# Patient Record
Sex: Female | Born: 1945 | Race: White | Hispanic: No | Smoking: Never smoker
Health system: Southern US, Community
[De-identification: ages and names within clinical notes are randomized; demographics above are authoritative.]

## PROBLEM LIST (undated history)

## (undated) DIAGNOSIS — R600 Localized edema: Secondary | ICD-10-CM

## (undated) DIAGNOSIS — R42 Dizziness and giddiness: Secondary | ICD-10-CM

## (undated) DIAGNOSIS — N83201 Unspecified ovarian cyst, right side: Secondary | ICD-10-CM

## (undated) DIAGNOSIS — I2510 Atherosclerotic heart disease of native coronary artery without angina pectoris: Secondary | ICD-10-CM

## (undated) DIAGNOSIS — J309 Allergic rhinitis, unspecified: Secondary | ICD-10-CM

## (undated) DIAGNOSIS — I4891 Unspecified atrial fibrillation: Secondary | ICD-10-CM

## (undated) DIAGNOSIS — M722 Plantar fascial fibromatosis: Secondary | ICD-10-CM

## (undated) DIAGNOSIS — I10 Essential (primary) hypertension: Secondary | ICD-10-CM

## (undated) DIAGNOSIS — F331 Major depressive disorder, recurrent, moderate: Secondary | ICD-10-CM

## (undated) DIAGNOSIS — F419 Anxiety disorder, unspecified: Secondary | ICD-10-CM

## (undated) DIAGNOSIS — K449 Diaphragmatic hernia without obstruction or gangrene: Secondary | ICD-10-CM

## (undated) DIAGNOSIS — D509 Iron deficiency anemia, unspecified: Secondary | ICD-10-CM

## (undated) DIAGNOSIS — E7800 Pure hypercholesterolemia, unspecified: Secondary | ICD-10-CM

## (undated) DIAGNOSIS — Z8781 Personal history of (healed) traumatic fracture: Secondary | ICD-10-CM

## (undated) DIAGNOSIS — N281 Cyst of kidney, acquired: Secondary | ICD-10-CM

## (undated) DIAGNOSIS — R5381 Other malaise: Secondary | ICD-10-CM

## (undated) DIAGNOSIS — R569 Unspecified convulsions: Secondary | ICD-10-CM

## (undated) DIAGNOSIS — E049 Nontoxic goiter, unspecified: Secondary | ICD-10-CM

## (undated) DIAGNOSIS — Z79899 Other long term (current) drug therapy: Secondary | ICD-10-CM

## (undated) DIAGNOSIS — F5101 Primary insomnia: Secondary | ICD-10-CM

## (undated) DIAGNOSIS — K219 Gastro-esophageal reflux disease without esophagitis: Secondary | ICD-10-CM

## (undated) DIAGNOSIS — R5383 Other fatigue: Secondary | ICD-10-CM

## (undated) DIAGNOSIS — M5134 Other intervertebral disc degeneration, thoracic region: Secondary | ICD-10-CM

## (undated) DIAGNOSIS — E785 Hyperlipidemia, unspecified: Secondary | ICD-10-CM

## (undated) DIAGNOSIS — R001 Bradycardia, unspecified: Secondary | ICD-10-CM

## (undated) DIAGNOSIS — M810 Age-related osteoporosis without current pathological fracture: Secondary | ICD-10-CM

## (undated) DIAGNOSIS — M5137 Other intervertebral disc degeneration, lumbosacral region: Secondary | ICD-10-CM

## (undated) DIAGNOSIS — R609 Edema, unspecified: Secondary | ICD-10-CM

## (undated) DIAGNOSIS — J984 Other disorders of lung: Secondary | ICD-10-CM

## (undated) DIAGNOSIS — E782 Mixed hyperlipidemia: Secondary | ICD-10-CM

## (undated) DIAGNOSIS — Z7409 Other reduced mobility: Secondary | ICD-10-CM

## (undated) DIAGNOSIS — I4892 Unspecified atrial flutter: Secondary | ICD-10-CM

## (undated) DIAGNOSIS — C50911 Malignant neoplasm of unspecified site of right female breast: Secondary | ICD-10-CM

## (undated) DIAGNOSIS — E119 Type 2 diabetes mellitus without complications: Secondary | ICD-10-CM

## (undated) DIAGNOSIS — M159 Polyosteoarthritis, unspecified: Secondary | ICD-10-CM

## (undated) HISTORY — DX: Plantar fascial fibromatosis: M72.2

## (undated) HISTORY — DX: Atherosclerotic heart disease of native coronary artery without angina pectoris: I25.10

## (undated) HISTORY — DX: Type 2 diabetes mellitus without complications: E11.9

## (undated) HISTORY — DX: Unspecified atrial fibrillation: I48.91

## (undated) HISTORY — DX: Cyst of kidney, acquired: N28.1

## (undated) HISTORY — DX: Allergic rhinitis, unspecified: J30.9

## (undated) HISTORY — DX: Other intervertebral disc degeneration, lumbosacral region: M51.37

## (undated) HISTORY — DX: Dizziness and giddiness: R42

## (undated) HISTORY — DX: Polyosteoarthritis, unspecified: M15.9

## (undated) HISTORY — DX: Edema, unspecified: R60.9

## (undated) HISTORY — PX: FOOT SURGERY: SHX648

## (undated) HISTORY — DX: Other reduced mobility: Z74.09

## (undated) HISTORY — DX: Anxiety disorder, unspecified: F41.9

## (undated) HISTORY — DX: Other long term (current) drug therapy: Z79.899

## (undated) HISTORY — DX: Diaphragmatic hernia without obstruction or gangrene: K44.9

## (undated) HISTORY — DX: Unspecified convulsions: R56.9

## (undated) HISTORY — DX: Other disorders of lung: J98.4

## (undated) HISTORY — DX: Localized edema: R60.0

## (undated) HISTORY — DX: Nontoxic goiter, unspecified: E04.9

## (undated) HISTORY — DX: Malignant neoplasm of unspecified site of right female breast: C50.911

## (undated) HISTORY — DX: Hyperlipidemia, unspecified: E78.5

## (undated) HISTORY — PX: TUBAL LIGATION: SHX77

## (undated) HISTORY — DX: Mixed hyperlipidemia: E78.2

## (undated) HISTORY — DX: Other malaise: R53.81

## (undated) HISTORY — DX: Other intervertebral disc degeneration, thoracic region: M51.34

## (undated) HISTORY — DX: Gastro-esophageal reflux disease without esophagitis: K21.9

## (undated) HISTORY — DX: Pure hypercholesterolemia, unspecified: E78.00

## (undated) HISTORY — DX: Iron deficiency anemia, unspecified: D50.9

## (undated) HISTORY — DX: Major depressive disorder, recurrent, moderate: F33.1

## (undated) HISTORY — DX: Essential (primary) hypertension: I10

## (undated) HISTORY — DX: Primary insomnia: F51.01

## (undated) HISTORY — DX: Unspecified atrial flutter: I48.92

## (undated) HISTORY — PX: CHOLECYSTECTOMY: SHX55

## (undated) HISTORY — PX: SHOULDER SURGERY: SHX246

## (undated) HISTORY — PX: BREAST SURGERY: SHX581

## (undated) HISTORY — DX: Personal history of (healed) traumatic fracture: Z87.81

## (undated) HISTORY — DX: Other fatigue: R53.83

## (undated) HISTORY — DX: Age-related osteoporosis without current pathological fracture: M81.0

## (undated) HISTORY — DX: Unspecified ovarian cyst, right side: N83.201

## (undated) HISTORY — DX: Bradycardia, unspecified: R00.1

## (undated) HISTORY — PX: ANKLE SURGERY: SHX546

## (undated) HISTORY — PX: CATARACT EXTRACTION: SUR2

## (undated) MED FILL — Ferumoxytol Inj 510 MG/17ML (30 MG/ML) (Elemental Fe): INTRAVENOUS | Qty: 17 | Status: AC

---

## 2006-04-27 ENCOUNTER — Inpatient Hospital Stay (HOSPITAL_COMMUNITY): Admission: RE | Admit: 2006-04-27 | Discharge: 2006-04-29 | Payer: Self-pay | Admitting: Orthopedic Surgery

## 2012-09-12 ENCOUNTER — Encounter (INDEPENDENT_AMBULATORY_CARE_PROVIDER_SITE_OTHER): Payer: Self-pay

## 2012-09-12 ENCOUNTER — Encounter (INDEPENDENT_AMBULATORY_CARE_PROVIDER_SITE_OTHER): Payer: Self-pay | Admitting: General Surgery

## 2012-09-23 ENCOUNTER — Encounter (INDEPENDENT_AMBULATORY_CARE_PROVIDER_SITE_OTHER): Payer: BLUE CROSS/BLUE SHIELD | Admitting: Surgery

## 2014-03-14 ENCOUNTER — Ambulatory Visit (INDEPENDENT_AMBULATORY_CARE_PROVIDER_SITE_OTHER): Payer: Medicare Other

## 2014-03-14 DIAGNOSIS — E1149 Type 2 diabetes mellitus with other diabetic neurological complication: Secondary | ICD-10-CM

## 2014-03-14 DIAGNOSIS — B070 Plantar wart: Secondary | ICD-10-CM

## 2014-03-14 DIAGNOSIS — Q828 Other specified congenital malformations of skin: Secondary | ICD-10-CM

## 2014-03-14 DIAGNOSIS — E1140 Type 2 diabetes mellitus with diabetic neuropathy, unspecified: Secondary | ICD-10-CM

## 2014-03-14 DIAGNOSIS — E1142 Type 2 diabetes mellitus with diabetic polyneuropathy: Secondary | ICD-10-CM

## 2014-03-14 NOTE — Progress Notes (Signed)
   Subjective:    Patient ID: Briana Barrett, female    DOB: September 25, 1946, 68 y.o.   MRN: [id number]  HPI I have a place on the ball of my left foot and has been going on for about a year and my husband cuts on it and sore and tender and I have used that corn and callus stuff otc     Review of Systems  Musculoskeletal: Positive for gait problem.  All other systems reviewed and are negative.       Objective:   Physical Exam Functioning objective findings as follows vascular status is intact with pedal pulses palpable DP and PT +2/4 bilateral. Capillary refill timed 3-4 seconds all digits skin temperature warm turgor normal no edema rubor pallor or varicosities noted. Neurologically epicritic and proprioceptive sensations appear to be intact and symmetric bilateral there is normal plantar response DTRs not listed dermatologically skin color pigment normal hair growth absent nails somewhat criptotic incurvated patient been doing self-care for the nails her husband to care for nails and skin issues and debridements neurologically there is an a keratoses plantar lateral fifth MTP area left and sub-heel right there is also diffuse keratoses pinch callus and first right and sub-second MTP area right and sub-first MTP area left is are more consistent with porokeratosis for hypertrophic keratoses secondary pressure and friction areas over the to a small nuclear is or possibly verrucoid in nature patient been using topical salicylic acid and wart medication her core medications with little or no success or lesions are relatively small superficial at this time both lesions are addressed and debrided pack to 66% salicylic acid for 24 hours under occlusion. The remaining keratotic lesions are also debridement multiple 4 keratoses and patient is advised that she she needs to avoid the salicylic acid and home no home remedies or other soft tissue and the future she has problems difficulties suggested followup for  palliative care is needed may use the knee the salicylic acid place for [id number]. At this time.       Assessment & Plan:  Assessment diabetes with mild peripheral neuropathy does have some paresthesia tinnitus and cramping in her legs although not significant no refusal symptoms pedal pulses palpable at this time does have multiple 4 keratoses multiple verruca which are debrided the verruca packed with salicylic acid for 24 hours and a keratoses debridement return at future for continued palliative care as needed for followup  Harriet Masson DPM

## 2014-03-14 NOTE — Patient Instructions (Addendum)
Diabetes and Foot Care Diabetes may cause you to have problems because of poor blood supply (circulation) to your feet and legs. This may cause the skin on your feet to become thinner, break easier, and heal more slowly. Your skin may become dry, and the skin may peel and crack. You may also have nerve damage in your legs and feet causing decreased feeling in them. You may not notice minor injuries to your feet that could lead to infections or more serious problems. Taking care of your feet is one of the most important things you can do for yourself.  HOME CARE INSTRUCTIONS  Wear shoes at all times, even in the house. Do not go barefoot. Bare feet are easily injured.  Check your feet daily for blisters, cuts, and redness. If you cannot see the bottom of your feet, use a mirror or ask someone for help.  Wash your feet with warm water (do not use hot water) and mild soap. Then pat your feet and the areas between your toes until they are completely dry. Do not soak your feet as this can dry your skin.  Apply a moisturizing lotion or petroleum jelly (that does not contain alcohol and is unscented) to the skin on your feet and to dry, brittle toenails. Do not apply lotion between your toes.  Trim your toenails straight across. Do not dig under them or around the cuticle. File the edges of your nails with an emery board or nail file.  Do not cut corns or calluses or try to remove them with medicine.  Wear clean socks or stockings every day. Make sure they are not too tight. Do not wear knee-high stockings since they may decrease blood flow to your legs.  Wear shoes that fit properly and have enough cushioning. To break in new shoes, wear them for just a few hours a day. This prevents you from injuring your feet. Always look in your shoes before you put them on to be sure there are no objects inside.  Do not cross your legs. This may decrease the blood flow to your feet.  If you find a minor scrape,  cut, or break in the skin on your feet, keep it and the skin around it clean and dry. These areas may be cleansed with mild soap and water. Do not cleanse the area with peroxide, alcohol, or iodine.  When you remove an adhesive bandage, be sure not to damage the skin around it.  If you have a wound, look at it several times a day to make sure it is healing.  Do not use heating pads or hot water bottles. They may burn your skin. If you have lost feeling in your feet or legs, you may not know it is happening until it is too late.  Make sure your health care provider performs a complete foot exam at least annually or more often if you have foot problems. Report any cuts, sores, or bruises to your health care provider immediately. SEEK MEDICAL CARE IF:   You have an injury that is not healing.  You have cuts or breaks in the skin.  You have an ingrown nail.  You notice redness on your legs or feet.  You feel burning or tingling in your legs or feet.  You have pain or cramps in your legs and feet.  Your legs or feet are numb.  Your feet always feel cold. SEEK IMMEDIATE MEDICAL CARE IF:   There is increasing redness,   swelling, or pain in or around a wound.  There is a red line that goes up your leg.  Pus is coming from a wound.  You develop a fever or as directed by your health care provider.  You notice a bad smell coming from an ulcer or wound. Document Released: 12/04/2000 Document Revised: 08/09/2013 Document Reviewed: 05/16/2013 [facility] Patient Information 2014 ExitCare, [place])  Warts are caused by a virus that has invaded the skin.  They are more common in young adults and children and a small percentage will resolve on their own.  There are many types of warts including mosaic warts (large flat), vulgaris (domed warts-have pearl like appearance), and plantar warts (flat or cauliflower like appearance).  Warts are highly contagious and may be picked  up from any surface.  Warts thrive in a warm moist environment and are common near pools, showers, and locker room floors.  Any microscopic cut in the skin is where the virus enters and becomes a wart.  Warts are very difficult to treat and get rid of.  Patience is necessary in the treatment of this virus.  It may take months to cure and different methods may have to be used to get rid of your wart.  Standard Initial Treatment is: 1. Periodic debridement of the wart and application of Canthacur to each lesion (a blistering agent that will slough off the warty skin) 2. Dispensing of topical treatments/prescriptions to apply to the wart at home  Other options include: 1. Excision of the lesion-numbing the skin around the wart and cutting it out-requires daily soaks post-operatively and takes about 2-3 weeks to fully heal 2. Excision with CO2 Laser-Performed at the surgical center your foot is numbed up and the lesions are all cut out and then lasered with a high power laser.  Very good for multiple warts that are resistant. 3. Cimetidine (Tagamet)-Oral agent used in high does--has shown better results in children  How do I apply the standard topical treatments?  1. Salicylic Acid (Compound W wart remover liquid or gel-available at drug or grocery stores)-Apply a dime size thickness over the wart and cover with duct tape-apply at night so the medication does not spread out to the good skin.  The skin will turn white and slowly blister off.  Use a pumice stone daily to remove the white skin as best you can.  If the skin gets too raw and painful, discontinue for a few days then resume. 2. Aldara (Imiquimod)-this is an immune response modifier.  They come in little packets so try to get at least 2 days out of each packet if you can.  Apply a small amount to the lesion and cover with duct tape.  Do not rub it in-let it absorb on its own.  Good to apply each morning.  Other Helpful Hints:  Wash shoes  that can be washed in the washing machine 2-3 x per month with some bleach  Use Lysol in shoes that cannot be washed and wipe out with a cloth 1 x per week-allow to dry for 8 hours before wearing again  Use a bleach solution (1 part bleach to 3 parts water) in your tub or shower to reduce the spread of the virus to yourself and others  Use aqua socks or clean sandals when at the pool or locker room to reduce the chance of picking up the virus or spreading it to others

## 2014-03-23 ENCOUNTER — Encounter (INDEPENDENT_AMBULATORY_CARE_PROVIDER_SITE_OTHER): Payer: Self-pay | Admitting: General Surgery

## 2015-08-27 DIAGNOSIS — I10 Essential (primary) hypertension: Secondary | ICD-10-CM

## 2015-08-27 HISTORY — DX: Essential (primary) hypertension: I10

## 2015-09-13 DIAGNOSIS — R569 Unspecified convulsions: Secondary | ICD-10-CM

## 2015-09-13 HISTORY — DX: Unspecified convulsions: R56.9

## 2015-10-10 DIAGNOSIS — M810 Age-related osteoporosis without current pathological fracture: Secondary | ICD-10-CM

## 2015-10-10 DIAGNOSIS — C50919 Malignant neoplasm of unspecified site of unspecified female breast: Secondary | ICD-10-CM | POA: Diagnosis not present

## 2015-10-10 DIAGNOSIS — D509 Iron deficiency anemia, unspecified: Secondary | ICD-10-CM

## 2015-12-20 DIAGNOSIS — Z170 Estrogen receptor positive status [ER+]: Secondary | ICD-10-CM | POA: Diagnosis not present

## 2015-12-20 DIAGNOSIS — D509 Iron deficiency anemia, unspecified: Secondary | ICD-10-CM

## 2015-12-20 DIAGNOSIS — C50511 Malignant neoplasm of lower-outer quadrant of right female breast: Secondary | ICD-10-CM | POA: Diagnosis not present

## 2016-01-21 DIAGNOSIS — J309 Allergic rhinitis, unspecified: Secondary | ICD-10-CM

## 2016-01-21 DIAGNOSIS — F331 Major depressive disorder, recurrent, moderate: Secondary | ICD-10-CM

## 2016-01-21 DIAGNOSIS — C50911 Malignant neoplasm of unspecified site of right female breast: Secondary | ICD-10-CM

## 2016-01-21 DIAGNOSIS — Z79899 Other long term (current) drug therapy: Secondary | ICD-10-CM

## 2016-01-21 DIAGNOSIS — M159 Polyosteoarthritis, unspecified: Secondary | ICD-10-CM

## 2016-01-21 DIAGNOSIS — R5381 Other malaise: Secondary | ICD-10-CM

## 2016-01-21 DIAGNOSIS — E119 Type 2 diabetes mellitus without complications: Secondary | ICD-10-CM | POA: Insufficient documentation

## 2016-01-21 DIAGNOSIS — M5137 Other intervertebral disc degeneration, lumbosacral region: Secondary | ICD-10-CM

## 2016-01-21 DIAGNOSIS — E782 Mixed hyperlipidemia: Secondary | ICD-10-CM

## 2016-01-21 DIAGNOSIS — I2510 Atherosclerotic heart disease of native coronary artery without angina pectoris: Secondary | ICD-10-CM

## 2016-01-21 DIAGNOSIS — R600 Localized edema: Secondary | ICD-10-CM

## 2016-01-21 DIAGNOSIS — M810 Age-related osteoporosis without current pathological fracture: Secondary | ICD-10-CM

## 2016-01-21 DIAGNOSIS — D509 Iron deficiency anemia, unspecified: Secondary | ICD-10-CM

## 2016-01-21 DIAGNOSIS — M51379 Other intervertebral disc degeneration, lumbosacral region without mention of lumbar back pain or lower extremity pain: Secondary | ICD-10-CM

## 2016-01-21 DIAGNOSIS — K219 Gastro-esophageal reflux disease without esophagitis: Secondary | ICD-10-CM

## 2016-01-21 DIAGNOSIS — N83201 Unspecified ovarian cyst, right side: Secondary | ICD-10-CM

## 2016-01-21 DIAGNOSIS — K449 Diaphragmatic hernia without obstruction or gangrene: Secondary | ICD-10-CM

## 2016-01-21 DIAGNOSIS — Z8781 Personal history of (healed) traumatic fracture: Secondary | ICD-10-CM

## 2016-01-21 DIAGNOSIS — E049 Nontoxic goiter, unspecified: Secondary | ICD-10-CM

## 2016-01-21 DIAGNOSIS — R7303 Prediabetes: Secondary | ICD-10-CM

## 2016-01-21 DIAGNOSIS — J984 Other disorders of lung: Secondary | ICD-10-CM

## 2016-01-21 DIAGNOSIS — R5383 Other fatigue: Secondary | ICD-10-CM

## 2016-01-21 DIAGNOSIS — N281 Cyst of kidney, acquired: Secondary | ICD-10-CM

## 2016-01-21 DIAGNOSIS — Z853 Personal history of malignant neoplasm of breast: Secondary | ICD-10-CM

## 2016-01-21 HISTORY — DX: Type 2 diabetes mellitus without complications: E11.9

## 2016-01-21 HISTORY — DX: Other malaise: R53.81

## 2016-01-21 HISTORY — DX: Personal history of malignant neoplasm of breast: Z85.3

## 2016-01-21 HISTORY — DX: Allergic rhinitis, unspecified: J30.9

## 2016-01-21 HISTORY — DX: Major depressive disorder, recurrent, moderate: F33.1

## 2016-01-21 HISTORY — DX: Other intervertebral disc degeneration, lumbosacral region: M51.37

## 2016-01-21 HISTORY — DX: Diaphragmatic hernia without obstruction or gangrene: K44.9

## 2016-01-21 HISTORY — DX: Cyst of kidney, acquired: N28.1

## 2016-01-21 HISTORY — DX: Atherosclerotic heart disease of native coronary artery without angina pectoris: I25.10

## 2016-01-21 HISTORY — DX: Gastro-esophageal reflux disease without esophagitis: K21.9

## 2016-01-21 HISTORY — DX: Iron deficiency anemia, unspecified: D50.9

## 2016-01-21 HISTORY — DX: Personal history of (healed) traumatic fracture: Z87.81

## 2016-01-21 HISTORY — DX: Other long term (current) drug therapy: Z79.899

## 2016-01-21 HISTORY — DX: Age-related osteoporosis without current pathological fracture: M81.0

## 2016-01-21 HISTORY — DX: Malignant neoplasm of unspecified site of right female breast: C50.911

## 2016-01-21 HISTORY — DX: Prediabetes: R73.03

## 2016-01-21 HISTORY — DX: Polyosteoarthritis, unspecified: M15.9

## 2016-01-21 HISTORY — DX: Other intervertebral disc degeneration, lumbosacral region without mention of lumbar back pain or lower extremity pain: M51.379

## 2016-01-21 HISTORY — DX: Localized edema: R60.0

## 2016-01-21 HISTORY — DX: Mixed hyperlipidemia: E78.2

## 2016-01-21 HISTORY — DX: Other disorders of lung: J98.4

## 2016-01-21 HISTORY — DX: Nontoxic goiter, unspecified: E04.9

## 2016-01-21 HISTORY — DX: Unspecified ovarian cyst, right side: N83.201

## 2016-04-13 DIAGNOSIS — F5101 Primary insomnia: Secondary | ICD-10-CM

## 2016-04-13 DIAGNOSIS — F419 Anxiety disorder, unspecified: Secondary | ICD-10-CM

## 2016-04-13 HISTORY — DX: Primary insomnia: F51.01

## 2016-04-13 HISTORY — DX: Anxiety disorder, unspecified: F41.9

## 2016-06-30 DIAGNOSIS — Z170 Estrogen receptor positive status [ER+]: Secondary | ICD-10-CM | POA: Diagnosis not present

## 2016-06-30 DIAGNOSIS — D509 Iron deficiency anemia, unspecified: Secondary | ICD-10-CM

## 2016-06-30 DIAGNOSIS — C50919 Malignant neoplasm of unspecified site of unspecified female breast: Secondary | ICD-10-CM | POA: Diagnosis not present

## 2016-07-23 DIAGNOSIS — I4892 Unspecified atrial flutter: Secondary | ICD-10-CM

## 2016-07-23 DIAGNOSIS — E785 Hyperlipidemia, unspecified: Secondary | ICD-10-CM

## 2016-07-23 HISTORY — DX: Hyperlipidemia, unspecified: E78.5

## 2016-07-23 HISTORY — DX: Unspecified atrial flutter: I48.92

## 2016-08-18 DIAGNOSIS — C50511 Malignant neoplasm of lower-outer quadrant of right female breast: Secondary | ICD-10-CM | POA: Diagnosis not present

## 2016-10-28 DIAGNOSIS — Z170 Estrogen receptor positive status [ER+]: Secondary | ICD-10-CM | POA: Diagnosis not present

## 2016-10-28 DIAGNOSIS — D509 Iron deficiency anemia, unspecified: Secondary | ICD-10-CM | POA: Diagnosis not present

## 2016-10-28 DIAGNOSIS — C50511 Malignant neoplasm of lower-outer quadrant of right female breast: Secondary | ICD-10-CM | POA: Diagnosis not present

## 2017-04-27 DIAGNOSIS — D509 Iron deficiency anemia, unspecified: Secondary | ICD-10-CM | POA: Diagnosis not present

## 2017-04-27 DIAGNOSIS — C50919 Malignant neoplasm of unspecified site of unspecified female breast: Secondary | ICD-10-CM | POA: Diagnosis not present

## 2017-04-27 DIAGNOSIS — Z170 Estrogen receptor positive status [ER+]: Secondary | ICD-10-CM | POA: Diagnosis not present

## 2017-07-02 DIAGNOSIS — R42 Dizziness and giddiness: Secondary | ICD-10-CM

## 2017-07-02 DIAGNOSIS — R609 Edema, unspecified: Secondary | ICD-10-CM | POA: Insufficient documentation

## 2017-07-02 DIAGNOSIS — R001 Bradycardia, unspecified: Secondary | ICD-10-CM

## 2017-07-02 HISTORY — DX: Dizziness and giddiness: R42

## 2017-07-02 HISTORY — DX: Edema, unspecified: R60.9

## 2017-07-02 HISTORY — DX: Bradycardia, unspecified: R00.1

## 2017-08-10 DIAGNOSIS — D509 Iron deficiency anemia, unspecified: Secondary | ICD-10-CM

## 2017-08-10 DIAGNOSIS — Z170 Estrogen receptor positive status [ER+]: Secondary | ICD-10-CM

## 2017-08-10 DIAGNOSIS — Z79811 Long term (current) use of aromatase inhibitors: Secondary | ICD-10-CM

## 2017-08-10 DIAGNOSIS — C50919 Malignant neoplasm of unspecified site of unspecified female breast: Secondary | ICD-10-CM

## 2018-01-27 ENCOUNTER — Other Ambulatory Visit: Payer: Self-pay

## 2018-02-10 DIAGNOSIS — Z170 Estrogen receptor positive status [ER+]: Secondary | ICD-10-CM

## 2018-02-10 DIAGNOSIS — C50911 Malignant neoplasm of unspecified site of right female breast: Secondary | ICD-10-CM

## 2018-02-10 DIAGNOSIS — D509 Iron deficiency anemia, unspecified: Secondary | ICD-10-CM

## 2018-06-10 DIAGNOSIS — C50911 Malignant neoplasm of unspecified site of right female breast: Secondary | ICD-10-CM

## 2018-06-10 DIAGNOSIS — Z170 Estrogen receptor positive status [ER+]: Secondary | ICD-10-CM

## 2018-06-10 DIAGNOSIS — Z862 Personal history of diseases of the blood and blood-forming organs and certain disorders involving the immune mechanism: Secondary | ICD-10-CM | POA: Diagnosis not present

## 2018-06-16 DIAGNOSIS — M5134 Other intervertebral disc degeneration, thoracic region: Secondary | ICD-10-CM

## 2018-06-16 HISTORY — DX: Other intervertebral disc degeneration, thoracic region: M51.34

## 2018-09-01 DIAGNOSIS — R739 Hyperglycemia, unspecified: Secondary | ICD-10-CM

## 2018-09-01 DIAGNOSIS — R0789 Other chest pain: Secondary | ICD-10-CM | POA: Diagnosis not present

## 2018-09-01 DIAGNOSIS — I160 Hypertensive urgency: Secondary | ICD-10-CM

## 2018-09-01 DIAGNOSIS — D509 Iron deficiency anemia, unspecified: Secondary | ICD-10-CM

## 2018-09-01 DIAGNOSIS — I249 Acute ischemic heart disease, unspecified: Secondary | ICD-10-CM

## 2018-09-02 DIAGNOSIS — R0789 Other chest pain: Secondary | ICD-10-CM | POA: Diagnosis not present

## 2018-09-02 DIAGNOSIS — E119 Type 2 diabetes mellitus without complications: Secondary | ICD-10-CM

## 2018-09-02 DIAGNOSIS — R739 Hyperglycemia, unspecified: Secondary | ICD-10-CM | POA: Diagnosis not present

## 2018-09-02 DIAGNOSIS — R079 Chest pain, unspecified: Secondary | ICD-10-CM

## 2018-09-02 DIAGNOSIS — I160 Hypertensive urgency: Secondary | ICD-10-CM | POA: Diagnosis not present

## 2018-09-02 DIAGNOSIS — D509 Iron deficiency anemia, unspecified: Secondary | ICD-10-CM | POA: Diagnosis not present

## 2018-09-03 DIAGNOSIS — D509 Iron deficiency anemia, unspecified: Secondary | ICD-10-CM | POA: Diagnosis not present

## 2018-09-03 DIAGNOSIS — R739 Hyperglycemia, unspecified: Secondary | ICD-10-CM | POA: Diagnosis not present

## 2018-09-03 DIAGNOSIS — R079 Chest pain, unspecified: Secondary | ICD-10-CM

## 2018-09-03 DIAGNOSIS — R0789 Other chest pain: Secondary | ICD-10-CM | POA: Diagnosis not present

## 2018-10-19 ENCOUNTER — Encounter: Payer: Self-pay | Admitting: *Deleted

## 2018-10-19 ENCOUNTER — Other Ambulatory Visit: Payer: Self-pay | Admitting: *Deleted

## 2018-10-19 DIAGNOSIS — Z7409 Other reduced mobility: Secondary | ICD-10-CM

## 2018-10-19 HISTORY — DX: Other reduced mobility: Z74.09

## 2018-10-21 ENCOUNTER — Encounter: Payer: Self-pay | Admitting: Cardiology

## 2018-10-21 ENCOUNTER — Ambulatory Visit (INDEPENDENT_AMBULATORY_CARE_PROVIDER_SITE_OTHER): Payer: Medicare Other | Admitting: Cardiology

## 2018-10-21 ENCOUNTER — Other Ambulatory Visit: Payer: Medicare Other

## 2018-10-21 VITALS — BP 150/90 | HR 59 | Ht 66.0 in | Wt 180.4 lb

## 2018-10-21 DIAGNOSIS — F5101 Primary insomnia: Secondary | ICD-10-CM

## 2018-10-21 DIAGNOSIS — I10 Essential (primary) hypertension: Secondary | ICD-10-CM

## 2018-10-21 DIAGNOSIS — R001 Bradycardia, unspecified: Secondary | ICD-10-CM

## 2018-10-21 DIAGNOSIS — R251 Tremor, unspecified: Secondary | ICD-10-CM

## 2018-10-21 DIAGNOSIS — I4892 Unspecified atrial flutter: Secondary | ICD-10-CM

## 2018-10-21 HISTORY — DX: Tremor, unspecified: R25.1

## 2018-10-21 NOTE — Progress Notes (Signed)
Cardiology Office Note:    Date:  10/21/2018   ID:  Briana Barrett, DOB 07/20/1946, MRN [id number]  PCP:  [name], MD  Cardiologist:  [name], MD    Referring MD: Bess Harvest*   Chief Complaint  Patient presents with  . Follow-up  I have problem with my blood pressure  History of Present Illness:    Briana Barrett is a 72 y.o. female with remote history of paroxysmal atrial flutter, successfully managed with amiodarone, hypertension, bradycardia, comes today to my office for follow-up.  New problem is tremor for last 2 to 3 months she experienced tremor in her hands.  Not to the point that she cannot eat cannot drink but tremor is quite noticeable.  She does not have any palpitations no tightness squeezing pressure burning chest recently she was in the hospital 2 months ago because of chest pain she will out for microinfarction stress test was done which was negative.  She comes today to office to discuss those issues.  There is a lot of stressful situation at home she is taking care of her sick husband with advanced COPD.  There is a lot of tension at home and she talks in length about that.  She clearly have difficulty dealing with stress.  Past Medical History:  Diagnosis Date  . Age-related osteoporosis without current pathological fracture 01/21/2016   Last Assessment & Plan:  Relevant Hx: Course: Daily Update: Today's Plan:she feels this is stable for her   Electronically signed by: [name], NP 05/11/16 1430  . Allergic rhinitis 01/21/2016   Last Assessment & Plan:  Relevant Hx: Course: Daily Update: Today's Plan:this is stable for her  Electronically signed by: [name], NP 05/11/16 1427  . Anemia, iron deficiency 01/21/2016   Last Assessment & Plan:  Her last iron level was 68 and she is taking the iron daily see her CBC  . Anxiety 04/13/2016   Last Assessment & Plan:  She is taking the xanax more daily  and not her zoloft and she thinks it helps her more  . Bradycardia 07/02/2017  . Calcification of lung 01/21/2016  . Coronary artery calcification seen on CT scan 01/21/2016  . DDD (degenerative disc disease), lumbosacral 01/21/2016   Last Assessment & Plan:  Relevant Hx: Course: Daily Update: Today's Plan:she is working again at third shift at the [employer] and she is on her feet and that is making this worse for her  Electronically signed by: [name], NP 05/11/16 1429  . Diabetes mellitus type 2, controlled ([place]) 01/21/2016   Last Assessment & Plan:  She does not check her sugar as her last average was good for her   . Dyslipidemia 07/23/2016  . Encounter for long-term (current) use of high-risk medication 01/21/2016  . Episodic lightheadedness 07/02/2017  . Gastroesophageal reflux disease without esophagitis 01/21/2016  . Hiatal hernia 01/21/2016   Last Assessment & Plan:  Relevant Hx: Course: Daily Update: Today's Plan:this is stable for her with the GERD  Electronically signed by: [name], NP 05/11/16 1428  . History of compression fracture of spine 01/21/2016  . Hypercholesteremia   . Hypertension   . Hypertension, essential 08/27/2015   Last Assessment & Plan:  Her BP readings that she brings in here are up and down, she has a pending appt with cardiology to FU on this, and she has an extreme amount of stress at home as well that is contributing to  this . She and I talked about her BP meds and she is taking a low dose of the clonidine but at this time she wants to monitor this, she is aware of how to take the losartan and is taki  . Impaired functional mobility, balance, gait, and endurance 10/19/2018  . Localized edema 01/21/2016  . Malaise and fatigue 01/21/2016   Last Assessment & Plan:  I really feel her S/S are tied to her BP and the heart rate and it not ideally being controlled for her with her inability to take multiple meds due to her S/e, she is  frustrated with this and she stopped her amiodarone last Thursday, and she did not have any episodes since august 4-5, but is taking coreg as directed  . Malignant neoplasm of right breast ([place]) 01/21/2016  . Mixed hyperlipidemia 01/21/2016   Last Assessment & Plan:  Relevant Hx: Course: Daily Update: Today's Plan:update this for her fasting  Electronically signed by: [name], NP 05/11/16 1432  . Moderate recurrent major depression ([place]) 01/21/2016   Last Assessment & Plan:  Relevant Hx: Course: Daily Update: Today's Plan:this has been stable for her  Electronically signed by: [name], NP 05/11/16 1432  . 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[place]) 09/13/2015  . 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 Osteoarthritis, generalized 01/21/2016   Last Assessment & Plan:  Relevant Hx: Course: Daily Update: Today's Plan:this is stable for her at this time  Electronically signed by: [name], NP 05/11/16 1430  . Ovarian cyst, right 01/21/2016  . Paroxysmal atrial flutter ([place]) 07/23/2016   Overview:  Chads score equals 1, prefers aspirin only Overview:  Overview:  Chads score equals 1, prefers aspirin only  . Plantar fasciitis   . Primary insomnia 04/13/2016   Last Assessment & Plan:  Relevant Hx: Course: Daily Update: Today's Plan:this has been more difficult with her working her third shift  Electronically signed by: [name], NP 05/11/16 1433  . Renal cyst, right 01/21/2016  . Swelling 07/02/2017  . 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 ANKLE SURGERY    . BREAST SURGERY    . CATARACT EXTRACTION    . CHOLECYSTECTOMY    . FOOT SURGERY     right  . SHOULDER SURGERY    . TUBAL LIGATION      Current Medications: Current Meds  Medication Sig  . alendronate (FOSAMAX) 70 MG tablet Take by mouth.  . ALPRAZolam (XANAX) 0.5 MG tablet Take 1 tablet  by mouth twice a day as needed.  Refills after prescription expiration require follow-up evaluation at the office.  Marland Kitchen amiodarone (PACERONE) 200 MG tablet Take 100 mg by mouth daily.  Marland Kitchen atorvastatin (LIPITOR) 40 MG tablet TAKE 1 TABLET BY MOUTH ONCE (1) DAILY  . citalopram (CELEXA) 10 MG tablet Take by mouth.  . furosemide (LASIX) 20 MG tablet Take by mouth.  . hydrALAZINE (APRESOLINE) 25 MG tablet Take by mouth.  . losartan-hydrochlorothiazide (HYZAAR) 100-12.5 MG tablet Take by mouth.  . lovastatin (MEVACOR) 20 MG tablet Take 20 mg by mouth at bedtime.  Marland Kitchen omeprazole (PRILOSEC) 40 MG capsule Take 40 mg by mouth daily.     Allergies:   Amlodipine besylate; Buprenorphine hcl; Morphine and related; and Morphine and related   Social History   Socioeconomic History  . Marital status: Married    Spouse name: Not  on file  . Number of children: Not on file  . Years of education: Not on file  . Highest education level: Not on file  Occupational History  . Not on file  Social Needs  . Financial resource strain: Not on file  . Food insecurity:    Worry: Not on file    Inability: Not on file  . Transportation needs:    Medical: Not on file    Non-medical: Not on file  Tobacco Use  . Smoking status: Never Smoker  . Smokeless tobacco: Never Used  Substance and Sexual Activity  . Alcohol use: No  . Drug use: No  . Sexual activity: Not on file  Lifestyle  . Physical activity:    Days per week: Not on file    Minutes per session: Not on file  . Stress: Not on file  Relationships  . Social connections:    Talks on phone: Not on file    Gets together: Not on file    Attends religious service: Not on file    Active member of club or organization: Not on file    Attends meetings of clubs or organizations: Not on file    Relationship status: Not on file  Other Topics Concern  . Not on file  Social History Narrative   ** Merged History Encounter **         Family History: The  patient's family history includes COPD in her father; Cancer in her mother; Heart failure in her sister; Hypertension in her father and mother. ROS:   Please see the history of present illness.    All 14 point review of systems negative except as described per history of present illness  EKGs/Labs/Other Studies Reviewed:      Recent Labs: No results found for requested labs within last 8760 hours.  Recent Lipid Panel No results found for: CHOL, TRIG, HDL, CHOLHDL, VLDL, LDLCALC, LDLDIRECT  Physical Exam:    VS:  BP (!) 150/90   Pulse (!) 59   Ht 5\' 6"  (1.676 m)   Wt 180 lb 6.4 Barrett (81.8 kg)   SpO2 98%   BMI 29.12 kg/m     Wt Readings from Last 3 Encounters:  10/21/18 180 lb 6.4 Barrett (81.8 kg)     GEN:  Well nourished, well developed in no acute distress HEENT: Normal NECK: No JVD; No carotid bruits LYMPHATICS: No lymphadenopathy CARDIAC: RRR, no murmurs, no rubs, no gallops RESPIRATORY:  Clear to auscultation without rales, wheezing or rhonchi  ABDOMEN: Soft, non-tender, non-distended MUSCULOSKELETAL:  No edema; No deformity  SKIN: Warm and dry LOWER EXTREMITIES: no swelling NEUROLOGIC:  Alert and oriented x 3 PSYCHIATRIC:  Normal affect   ASSESSMENT:    1. Paroxysmal atrial flutter ([place])   2. Hypertension, essential   3. Bradycardia   4. Primary insomnia   5. Tremor    PLAN:    In order of problems listed above:  1. Paroxysmal atrial flutter.  I do not see any recent documentation of this arrhythmia she is taking amiodarone 100 mg daily for long time which seems to be controlling this arrhythmia however she developed potential complication of amiodarone meaning tremor.  I will ask her to stop amiodarone I will schedule him to have echocardiogram to look at the left atrial size to get some more sense of how often if any of supraventricular arrhythmias she gets.  Looking at her EKG she does have left ventricle hypertrophy which make me worried  that her left atrium is  probably significantly enlarged because of essential hypertension and left ventricle hypertrophy.  If she truly got recurrences of atrial flutter then we may consider atrial flutter ablation.  She is not anticoagulated however she truly got atrial flutter she should be.  Again we will try to investigate this more thoroughly. 2. Essential hypertension difficult to control I will maintain her on present medications however previously she was fairly controlled with beta-blocker beta-blocker has been discontinued because of bradycardia if I stopped her amiodarone we may have more room with her heart rate and will be able to initiate small dose of beta-blocker. 3. Primary insomnia with talking length about that I suspect she may be having some sleep apnea we started talking about sleep study however she declined. 4. Tremor possibly related to amiodarone will discontinue that medication. 5. Overall she does have quite stressful situation at home we talked about nonpharmacological ways to manage her blood pressure including relaxation technique as well as avoiding salty food.  She understands she will try to do that. 6.    Medication Adjustments/Labs and Tests Ordered: Current medicines are reviewed at length with the patient today.  Concerns regarding medicines are outlined above.  No orders of the defined types were placed in this encounter.  Medication changes: No orders of the defined types were placed in this encounter.   Signed, Park Liter, MD, [facility] 10/21/2018 12:07 PM    [place]

## 2018-10-21 NOTE — Patient Instructions (Signed)
Medication Instructions:  Your physician has recommended you make the following change in your medication:  STOP amiodarone  If you need a refill on your cardiac medications before your next appointment, please call your pharmacy.   Lab work: None  If you have labs (blood work) drawn today and your tests are completely normal, you will receive your results only by: Marland Kitchen MyChart Message (if you have MyChart) OR . A paper copy in the mail If you have any lab test that is abnormal or we need to change your treatment, we will call you to review the results.  Testing/Procedures: Your physician has requested that you have an echocardiogram. Echocardiography is a painless test that uses sound waves to create images of your heart. It provides your doctor with information about the size and shape of your heart and how well your heart's chambers and valves are working. This procedure takes approximately one hour. There are no restrictions for this procedure.  Your physician has recommended that you wear an event monitor. Event monitors are medical devices that record the heart's electrical activity. Doctors most often [place] these monitors to diagnose arrhythmias. Arrhythmias are problems with the speed or rhythm of the heartbeat. The monitor is a small, portable device. You can wear one while you do your normal daily activities. This is usually used to diagnose what is causing palpitations/syncope (passing out).  Follow-Up: At [facility], you and your health needs are our priority.  As part of our continuing mission to provide you with exceptional heart care, we have created designated Provider Care Teams.  These Care Teams include your primary Cardiologist (physician) and Advanced Practice Providers (APPs -  Physician Assistants and Nurse Practitioners) who all work together to provide you with the care you need, when you need it.  You will need a follow up appointment in 2 months.  Please call our office  2 months in advance to schedule this appointment.  You may see another member of our [employer] Provider Team in [place]: Shirlee More, MD . Jyl Heinz, MD  Any Other Special Instructions Will Be Listed Below (If Applicable).

## 2018-10-24 ENCOUNTER — Ambulatory Visit (INDEPENDENT_AMBULATORY_CARE_PROVIDER_SITE_OTHER): Payer: Medicare Other

## 2018-10-24 DIAGNOSIS — I480 Paroxysmal atrial fibrillation: Secondary | ICD-10-CM | POA: Diagnosis not present

## 2018-10-24 DIAGNOSIS — R001 Bradycardia, unspecified: Secondary | ICD-10-CM

## 2018-10-24 DIAGNOSIS — I4892 Unspecified atrial flutter: Secondary | ICD-10-CM

## 2018-10-24 DIAGNOSIS — I10 Essential (primary) hypertension: Secondary | ICD-10-CM | POA: Diagnosis not present

## 2018-10-24 NOTE — Progress Notes (Signed)
Complete echocardiogram has been performed.  Jimmy Zuleika Gallus RDCS, RVT 

## 2018-10-25 ENCOUNTER — Telehealth: Payer: Self-pay | Admitting: Emergency Medicine

## 2018-10-25 DIAGNOSIS — I10 Essential (primary) hypertension: Secondary | ICD-10-CM

## 2018-10-25 DIAGNOSIS — I4892 Unspecified atrial flutter: Secondary | ICD-10-CM

## 2018-10-25 NOTE — Telephone Encounter (Signed)
Patient informed to have labs drawn tomorrow per Dr. Agustin Cree, Patient verbally understands

## 2018-10-27 ENCOUNTER — Telehealth: Payer: Self-pay | Admitting: Cardiology

## 2018-10-27 NOTE — Telephone Encounter (Signed)
Briana Barrett with [facility] has questions about her therapy ?

## 2018-10-27 NOTE — Telephone Encounter (Signed)
Confirmed with [facility] that patient is to be taking losartan-hydrochlorothiazide 100mg -12.5mg  daily.

## 2018-10-28 ENCOUNTER — Telehealth: Payer: Self-pay | Admitting: Emergency Medicine

## 2018-10-28 ENCOUNTER — Telehealth: Payer: Self-pay | Admitting: Cardiology

## 2018-10-28 MED ORDER — APIXABAN 5 MG PO TABS: 5.0000 mg | ORAL_TABLET | Freq: Two times a day (BID) | ORAL | 3 refills | Status: DC

## 2018-10-28 NOTE — Telephone Encounter (Signed)
Patient informed of lab results and to start eliquis 5 mg twice daily and to stop aspirin. Patient verbally understands.

## 2018-10-28 NOTE — Telephone Encounter (Signed)
Patient called to say she cannot afford Eloquis and needs something else that is not quite that expensive please.

## 2018-10-31 NOTE — Telephone Encounter (Signed)
Patient reports eliquis being too expensive and she will not be able to afford it. Provided patient with patient assistance phone number. She will reach out to them and will discuss further at her office visit tomorrow.

## 2018-11-01 ENCOUNTER — Encounter: Payer: Self-pay | Admitting: Cardiology

## 2018-11-01 ENCOUNTER — Ambulatory Visit (INDEPENDENT_AMBULATORY_CARE_PROVIDER_SITE_OTHER): Payer: Medicare Other | Admitting: Cardiology

## 2018-11-01 VITALS — BP 122/78 | HR 61 | Ht 66.0 in | Wt 179.2 lb

## 2018-11-01 DIAGNOSIS — I10 Essential (primary) hypertension: Secondary | ICD-10-CM | POA: Diagnosis not present

## 2018-11-01 DIAGNOSIS — R251 Tremor, unspecified: Secondary | ICD-10-CM | POA: Diagnosis not present

## 2018-11-01 DIAGNOSIS — R001 Bradycardia, unspecified: Secondary | ICD-10-CM | POA: Diagnosis not present

## 2018-11-01 DIAGNOSIS — I5030 Unspecified diastolic (congestive) heart failure: Secondary | ICD-10-CM | POA: Insufficient documentation

## 2018-11-01 DIAGNOSIS — I4892 Unspecified atrial flutter: Secondary | ICD-10-CM

## 2018-11-01 DIAGNOSIS — I5032 Chronic diastolic (congestive) heart failure: Secondary | ICD-10-CM

## 2018-11-01 HISTORY — DX: Unspecified diastolic (congestive) heart failure: I50.30

## 2018-11-01 LAB — SPECIMEN STATUS REPORT

## 2018-11-01 NOTE — Progress Notes (Signed)
Cardiology Office Note:    Date:  11/01/2018   ID:  Briana Barrett, DOB 09/17/1946, MRN [id number]  PCP:  [name], MD  Cardiologist:  [name], MD    Referring MD: [name], MD   Chief Complaint  Patient presents with  . Discuss anti-coag  Eliquis is too expensive  History of Present Illness:    Briana Barrett is a 72 y.o. female with history of paroxysmal atrial flutter.  She is anticoagulated now because of high chads 2 Vascor.  And I will continue she complained about the price of the medication will try to help her the best we can I also stopped her amiodarone last time when she was here because of tremor that she developed in the tremor significantly better.  She denies having any palpitations she is wearing event recorder to check and see if she get any evidence of atrial flutter/atrial fibrillation.  Would make me worry is the fact that her echocardiogram shows severe left ventricle hypertrophy with biatrial enlargement which obviously makes her more likely to have atrial flutter/fibrillation.  We will see what monitor shows.  So far no antiarrhythmic amiodarone has been withdrawn.  She complained of having exertional shortness of breath I suspect may be diastolic dysfunction  Past Medical History:  Diagnosis Date  . Age-related osteoporosis without current pathological fracture 01/21/2016   Last Assessment & Plan:  Relevant Hx: Course: Daily Update: Today's Plan:she feels this is stable for her   Electronically signed by: [name], NP 05/11/16 1430  . Allergic rhinitis 01/21/2016   Last Assessment & Plan:  Relevant Hx: Course: Daily Update: Today's Plan:this is stable for her  Electronically signed by: [name], NP 05/11/16 1427  . Anemia, iron deficiency 01/21/2016   Last Assessment & Plan:  Her last iron level was 68 and she is taking the iron daily see her CBC  . Anxiety 04/13/2016   Last Assessment & Plan:   She is taking the xanax more daily and not her zoloft and she thinks it helps her more  . Bradycardia 07/02/2017  . Calcification of lung 01/21/2016  . Coronary artery calcification seen on CT scan 01/21/2016  . DDD (degenerative disc disease), lumbosacral 01/21/2016   Last Assessment & Plan:  Relevant Hx: Course: Daily Update: Today's Plan:she is working again at third shift at the [employer] and she is on her feet and that is making this worse for her  Electronically signed by: [name], NP 05/11/16 1429  . Diabetes mellitus type 2, controlled ([place]) 01/21/2016   Last Assessment & Plan:  She does not check her sugar as her last average was good for her   . Dyslipidemia 07/23/2016  . Encounter for long-term (current) use of high-risk medication 01/21/2016  . Episodic lightheadedness 07/02/2017  . Gastroesophageal reflux disease without esophagitis 01/21/2016  . Hiatal hernia 01/21/2016   Last Assessment & Plan:  Relevant Hx: Course: Daily Update: Today's Plan:this is stable for her with the GERD  Electronically signed by: [name], NP 05/11/16 1428  . History of compression fracture of spine 01/21/2016  . Hypercholesteremia   . Hypertension   . Hypertension, essential 08/27/2015   Last Assessment & Plan:  Her BP readings that she brings in here are up and down, she has a pending appt with cardiology to FU on this, and she has an extreme amount of stress at home as well that is contributing to this .  She and I talked about her BP meds and she is taking a low dose of the clonidine but at this time she wants to monitor this, she is aware of how to take the losartan and is taki  . Impaired functional mobility, balance, gait, and endurance 10/19/2018  . Localized edema 01/21/2016  . Malaise and fatigue 01/21/2016   Last Assessment & Plan:  I really feel her S/S are tied to her BP and the heart rate and it not ideally being controlled for her with her inability to take multiple  meds due to her S/e, she is frustrated with this and she stopped her amiodarone last Thursday, and she did not have any episodes since august 4-5, but is taking coreg as directed  . Malignant neoplasm of right breast ([place]) 01/21/2016  . Mixed hyperlipidemia 01/21/2016   Last Assessment & Plan:  Relevant Hx: Course: Daily Update: Today's Plan:update this for her fasting  Electronically signed by: [name], NP 05/11/16 1432  . Moderate recurrent major depression ([place]) 01/21/2016   Last Assessment & Plan:  Relevant Hx: Course: Daily Update: Today's Plan:this has been stable for her  Electronically signed by: [name], NP 05/11/16 1432  . 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[place]) 09/13/2015  . 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 Osteoarthritis, generalized 01/21/2016   Last Assessment & Plan:  Relevant Hx: Course: Daily Update: Today's Plan:this is stable for her at this time  Electronically signed by: [name], NP 05/11/16 1430  . Ovarian cyst, right 01/21/2016  . Paroxysmal atrial flutter ([place]) 07/23/2016   Overview:  Chads score equals 1, prefers aspirin only Overview:  Overview:  Chads score equals 1, prefers aspirin only  . Plantar fasciitis   . Primary insomnia 04/13/2016   Last Assessment & Plan:  Relevant Hx: Course: Daily Update: Today's Plan:this has been more difficult with her working her third shift  Electronically signed by: [name], NP 05/11/16 1433  . Renal cyst, right 01/21/2016  . Swelling 07/02/2017  . 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 ANKLE SURGERY    . BREAST SURGERY    . CATARACT EXTRACTION    . CHOLECYSTECTOMY    . FOOT SURGERY     right  . SHOULDER SURGERY    . TUBAL LIGATION      Current Medications: Current Meds  Medication Sig  . alendronate (FOSAMAX) 70 MG tablet Take by mouth.  . ALPRAZolam (XANAX)  0.5 MG tablet Take 1 tablet by mouth twice a day as needed.  Refills after prescription expiration require follow-up evaluation at the office.  Marland Kitchen atorvastatin (LIPITOR) 40 MG tablet TAKE 1 TABLET BY MOUTH ONCE (1) DAILY  . furosemide (LASIX) 20 MG tablet Take by mouth.  . hydrALAZINE (APRESOLINE) 25 MG tablet Take by mouth.  . losartan-hydrochlorothiazide (HYZAAR) 100-12.5 MG tablet Take by mouth.  . lovastatin (MEVACOR) 20 MG tablet Take 20 mg by mouth at bedtime.  Marland Kitchen omeprazole (PRILOSEC) 40 MG capsule Take 40 mg by mouth daily.     Allergies:   Amlodipine besylate; Buprenorphine hcl; Morphine and related; and Morphine and related   Social History   Socioeconomic History  . Marital status: Married    Spouse name: Not on file  . Number of children: Not on file  . Years of education: Not on file  . Highest education level: Not on  file  Occupational History  . Not on file  Social Needs  . Financial resource strain: Not on file  . Food insecurity:    Worry: Not on file    Inability: Not on file  . Transportation needs:    Medical: Not on file    Non-medical: Not on file  Tobacco Use  . Smoking status: Never Smoker  . Smokeless tobacco: Never Used  Substance and Sexual Activity  . Alcohol use: No  . Drug use: No  . Sexual activity: Not on file  Lifestyle  . Physical activity:    Days per week: Not on file    Minutes per session: Not on file  . Stress: Not on file  Relationships  . Social connections:    Talks on phone: Not on file    Gets together: Not on file    Attends religious service: Not on file    Active member of club or organization: Not on file    Attends meetings of clubs or organizations: Not on file    Relationship status: Not on file  Other Topics Concern  . Not on file  Social History Narrative   ** Merged History Encounter **         Family History: The patient's family history includes COPD in her father; Cancer in her mother; Heart failure in her  sister; Hypertension in her father and mother. ROS:   Please see the history of present illness.    All 14 point review of systems negative except as described per history of present illness  EKGs/Labs/Other Studies Reviewed:      Recent Labs: 10/26/2018: BUN 13; Creatinine, Ser 0.83; Hemoglobin 13.0; Platelets 225; Potassium 4.0; Sodium 142  Recent Lipid Panel No results found for: CHOL, TRIG, HDL, CHOLHDL, VLDL, LDLCALC, LDLDIRECT  Physical Exam:    VS:  BP 122/78   Pulse 61   Ht 5\' 6"  (1.676 m)   Wt 179 lb 3.2 Barrett (81.3 kg)   SpO2 98%   BMI 28.92 kg/m     Wt Readings from Last 3 Encounters:  11/01/18 179 lb 3.2 Barrett (81.3 kg)  10/21/18 180 lb 6.4 Barrett (81.8 kg)     GEN:  Well nourished, well developed in no acute distress HEENT: Normal NECK: No JVD; No carotid bruits LYMPHATICS: No lymphadenopathy CARDIAC: RRR, no murmurs, no rubs, no gallops RESPIRATORY:  Clear to auscultation without rales, wheezing or rhonchi  ABDOMEN: Soft, non-tender, non-distended MUSCULOSKELETAL:  No edema; No deformity  SKIN: Warm and dry LOWER EXTREMITIES: no swelling NEUROLOGIC:  Alert and oriented x 3 PSYCHIATRIC:  Normal affect   ASSESSMENT:    1. Paroxysmal atrial flutter ([place])   2. Hypertension, essential   3. Bradycardia   4. Tremor   5. Chronic diastolic congestive heart failure, NYHA class 2 (HCC)    PLAN:    In order of problems listed above:  1. Paroxysmal atrial flutter she is wearing event recorder with anticoagulation initiated I will continue.  Echocardiogram showed biatrial significant enlargement 2. Essential hypertension blood pressure appears to be well controlled today however she brought results from home on oral elevated.  I will double the dose of furosemide she will take 40 mg daily I will check Chem-7 next week 3. Bradycardia no dizziness no passing out. 4. Tremor improved. 5. Chronic diastolic congestive heart rate class II will double the dose of  furosemide   Medication Adjustments/Labs and Tests Ordered: Current medicines are reviewed at length with the  patient today.  Concerns regarding medicines are outlined above.  No orders of the defined types were placed in this encounter.  Medication changes: No orders of the defined types were placed in this encounter.   Signed, Park Liter, MD, [facility] 11/01/2018 2:28 PM    [facility]

## 2018-11-01 NOTE — Patient Instructions (Signed)
Medication Instructions:  Your physician has recommended you make the following change in your medication:   Start: Eliquis 5 mg twice daily.   If you need a refill on your cardiac medications before your next appointment, please call your pharmacy.   Lab work: Your physician recommends that you return for lab work in 1 week: BMP   If you have labs (blood work) drawn today and your tests are completely normal, you will receive your results only by: Marland Kitchen MyChart Message (if you have MyChart) OR . A paper copy in the mail If you have any lab test that is abnormal or we need to change your treatment, we will call you to review the results.  Testing/Procedures: None.   Follow-Up: At [facility], you and your health needs are our priority.  As part of our continuing mission to provide you with exceptional heart care, we have created designated Provider Care Teams.  These Care Teams include your primary Cardiologist (physician) and Advanced Practice Providers (APPs -  Physician Assistants and Nurse Practitioners) who all work together to provide you with the care you need, when you need it. You will need a follow up appointment in 6 weeks.  Please call our office 2 months in advance to schedule this appointment.  You may see No primary care provider on file. or another member of our [employer] Provider Team in [place]: Shirlee More, MD . Jyl Heinz, MD  Any Other Special Instructions Will Be Listed Below (If Applicable).

## 2018-11-03 ENCOUNTER — Telehealth: Payer: Self-pay

## 2018-11-03 NOTE — Telephone Encounter (Signed)
[employer] rep called stating that patient had a 3.2 sec pause today around 7 am. Per Marcello Moores the patient was SB prior and post pause. Per Marcello Moores the patient did not mark an associated symptom and they were unable to reach the patient. Requested report be faxed to [facility] office attn Dr. Agustin Cree.

## 2018-11-03 NOTE — Telephone Encounter (Signed)
Dr. Agustin Cree aware and has reviewed the report. Spoke with patient's husband he will have her call us back to confirm if patient was awake when this event occurred.

## 2018-11-03 NOTE — Telephone Encounter (Signed)
Patient reports waking up at 5:30 am this morning and getting out of bed between 630 am and 7am. She didn't report any symptoms or feeling any different. She doesn't report feeling weak in her legs. Dr. Agustin Cree aware. No changes at this time patient aware

## 2018-11-04 ENCOUNTER — Encounter: Payer: Self-pay | Admitting: Specialist

## 2018-11-08 LAB — BASIC METABOLIC PANEL
BUN/Creatinine Ratio: 16 (ref 12–28)
BUN: 15 mg/dL (ref 8–27)
CO2: 25 mmol/L (ref 20–29)
CREATININE: 0.92 mg/dL (ref 0.57–1.00)
Calcium: 9.2 mg/dL (ref 8.7–10.3)
Chloride: 101 mmol/L (ref 96–106)
GFR calc Af Amer: 71 mL/min/{1.73_m2} (ref >59)
GFR calc non Af Amer: 62 mL/min/{1.73_m2} (ref >59)
GLUCOSE: 107 mg/dL — AB (ref 65–99)
Potassium: 3.3 mmol/L — ABNORMAL LOW (ref 3.5–5.2)
SODIUM: 142 mmol/L (ref 134–144)

## 2018-11-11 ENCOUNTER — Telehealth: Payer: Self-pay | Admitting: Emergency Medicine

## 2018-11-11 DIAGNOSIS — I10 Essential (primary) hypertension: Secondary | ICD-10-CM

## 2018-11-11 MED ORDER — POTASSIUM CHLORIDE ER 20 MEQ PO TBCR: 20.0000 meq | EXTENDED_RELEASE_TABLET | Freq: Every day | ORAL | 0 refills | Status: DC

## 2018-11-11 NOTE — Telephone Encounter (Signed)
Informed patient of lab results and to start potassium 20 meq daily she will also return in one week for lab work. She verbally understands.

## 2018-11-11 NOTE — Addendum Note (Signed)
Addended by: Ashok Norris on: 11/11/2018 01:56 PM   Modules accepted: Orders

## 2018-11-11 NOTE — Telephone Encounter (Signed)
Left message for patient to return call regarding results  

## 2018-11-16 LAB — BASIC METABOLIC PANEL
BUN / CREAT RATIO: 19 (ref 12–28)
BUN: 19 mg/dL (ref 8–27)
CALCIUM: 8.9 mg/dL (ref 8.7–10.3)
CHLORIDE: 104 mmol/L (ref 96–106)
CO2: 24 mmol/L (ref 20–29)
CREATININE: 1.02 mg/dL — AB (ref 0.57–1.00)
GFR, EST AFRICAN AMERICAN: 63 mL/min/{1.73_m2} (ref >59)
GFR, EST NON AFRICAN AMERICAN: 54 mL/min/{1.73_m2} — AB (ref >59)
Glucose: 129 mg/dL — ABNORMAL HIGH (ref 65–99)
Potassium: 3.6 mmol/L (ref 3.5–5.2)
Sodium: 143 mmol/L (ref 134–144)

## 2018-11-21 LAB — BASIC METABOLIC PANEL
BUN / CREAT RATIO: 16 (ref 12–28)
BUN: 13 mg/dL (ref 8–27)
CO2: 25 mmol/L (ref 20–29)
CREATININE: 0.83 mg/dL (ref 0.57–1.00)
Calcium: 8.8 mg/dL (ref 8.7–10.3)
Chloride: 105 mmol/L (ref 96–106)
GFR calc Af Amer: 80 mL/min/{1.73_m2} (ref >59)
GFR calc non Af Amer: 70 mL/min/{1.73_m2} (ref >59)
GLUCOSE: 79 mg/dL (ref 65–99)
Potassium: 4 mmol/L (ref 3.5–5.2)
SODIUM: 142 mmol/L (ref 134–144)

## 2018-11-21 LAB — PROTIME-INR
INR: 1 (ref 0.8–1.2)
PROTHROMBIN TIME: 10.4 s (ref 9.1–12.0)

## 2018-11-21 LAB — CBC
HEMOGLOBIN: 13 g/dL (ref 11.1–15.9)
Hematocrit: 40 % (ref 34.0–46.6)
MCH: 27.4 pg (ref 26.6–33.0)
MCHC: 32.5 g/dL (ref 31.5–35.7)
MCV: 84 fL (ref 79–97)
PLATELETS: 225 10*3/uL (ref 150–450)
RBC: 4.75 x10E6/uL (ref 3.77–5.28)
RDW: 13.2 % (ref 12.3–15.4)
WBC: 5.4 10*3/uL (ref 3.4–10.8)

## 2018-12-06 ENCOUNTER — Telehealth: Payer: Self-pay | Admitting: Emergency Medicine

## 2018-12-06 NOTE — Telephone Encounter (Signed)
Called to inform patient that liquids was denied by patient assistance. She is aware and not starting the medication due to this. Will inform Dr. Agustin Cree.

## 2018-12-09 DIAGNOSIS — Z853 Personal history of malignant neoplasm of breast: Secondary | ICD-10-CM

## 2018-12-09 DIAGNOSIS — Z862 Personal history of diseases of the blood and blood-forming organs and certain disorders involving the immune mechanism: Secondary | ICD-10-CM | POA: Diagnosis not present

## 2018-12-30 ENCOUNTER — Ambulatory Visit: Payer: Medicare Other | Admitting: Cardiology

## 2019-01-31 ENCOUNTER — Ambulatory Visit: Payer: Medicare Other | Admitting: Cardiology

## 2019-01-31 ENCOUNTER — Other Ambulatory Visit: Payer: Self-pay | Admitting: Cardiology

## 2019-01-31 MED ORDER — FUROSEMIDE 20 MG PO TABS: 20.0000 mg | ORAL_TABLET | Freq: Every day | ORAL | 2 refills | Status: DC

## 2019-01-31 NOTE — Telephone Encounter (Signed)
Sent in Rx for Furosemide

## 2019-01-31 NOTE — Addendum Note (Signed)
Addended by: Anselm Pancoast on: 01/31/2019 04:49 PM   Modules accepted: Orders

## 2019-01-31 NOTE — Telephone Encounter (Signed)
°*  STAT* If patient is at the pharmacy, call can be transferred to refill team.   1. Which medications need to be refilled? (please list name of each medication and dose if known) Furisemide 1 daily   2. Which pharmacy/location (including street and city if local pharmacy) is medication to be sent to? [facility] on [address]  3. Do they need a 30 day or 90 day supply? [place]

## 2019-02-23 ENCOUNTER — Encounter: Payer: Self-pay | Admitting: Cardiology

## 2019-02-23 ENCOUNTER — Ambulatory Visit (INDEPENDENT_AMBULATORY_CARE_PROVIDER_SITE_OTHER): Payer: Medicare Other | Admitting: Cardiology

## 2019-02-23 VITALS — BP 146/80 | HR 70 | Ht 66.0 in | Wt 186.4 lb

## 2019-02-23 DIAGNOSIS — I2510 Atherosclerotic heart disease of native coronary artery without angina pectoris: Secondary | ICD-10-CM | POA: Diagnosis not present

## 2019-02-23 DIAGNOSIS — I5032 Chronic diastolic (congestive) heart failure: Secondary | ICD-10-CM

## 2019-02-23 DIAGNOSIS — I10 Essential (primary) hypertension: Secondary | ICD-10-CM | POA: Diagnosis not present

## 2019-02-23 DIAGNOSIS — I4892 Unspecified atrial flutter: Secondary | ICD-10-CM | POA: Diagnosis not present

## 2019-02-23 MED ORDER — METOPROLOL SUCCINATE ER 25 MG PO TB24: 25.0000 mg | ORAL_TABLET | Freq: Every day | ORAL | 2 refills | Status: DC

## 2019-02-23 NOTE — Progress Notes (Signed)
Cardiology Office Note:    Date:  02/23/2019   ID:  Briana Barrett, DOB 07/21/1946, MRN [id number]  PCP:  [name], MD  Cardiologist:  [name], MD    Referring MD: [name], MD   Chief Complaint  Patient presents with  . Follow up testing  Doing well  History of Present Illness:    Briana Barrett is a 73 y.o. female with paroxysmal atrial fibrillation.  Overall she is doing well actually she started part-time job only 1 day a week she works as a Training and development officer however she tells me that it is very difficult for her but she still wants to go there and work just to get out of her house.  Denies having any palpitations.  She did wear monitor which showed some narrow complex regular tachycardia.  I think she can benefit from small dose of beta-blocker which I will initiate I will give her 25 mg of Toprol-XL.  We again continue conversation about anticoagulation she does not want to take any newer anticoagulant agent.  I stressed importance of need to take this medications.  She said she will think about Coumadin.  She was not ready to make a decision today yet.  Past Medical History:  Diagnosis Date  . Age-related osteoporosis without current pathological fracture 01/21/2016   Last Assessment & Plan:  Relevant Hx: Course: Daily Update: Today's Plan:she feels this is stable for her   Electronically signed by: [name], NP 05/11/16 1430  . Allergic rhinitis 01/21/2016   Last Assessment & Plan:  Relevant Hx: Course: Daily Update: Today's Plan:this is stable for her  Electronically signed by: [name], NP 05/11/16 1427  . Anemia, iron deficiency 01/21/2016   Last Assessment & Plan:  Her last iron level was 68 and she is taking the iron daily see her CBC  . Anxiety 04/13/2016   Last Assessment & Plan:  She is taking the xanax more daily and not her zoloft and she thinks it helps her more  . Bradycardia 07/02/2017  . Calcification of lung  01/21/2016  . Coronary artery calcification seen on CT scan 01/21/2016  . DDD (degenerative disc disease), lumbosacral 01/21/2016   Last Assessment & Plan:  Relevant Hx: Course: Daily Update: Today's Plan:she is working again at third shift at the [employer] and she is on her feet and that is making this worse for her  Electronically signed by: [name], NP 05/11/16 1429  . Diabetes mellitus type 2, controlled ([place]) 01/21/2016   Last Assessment & Plan:  She does not check her sugar as her last average was good for her   . Dyslipidemia 07/23/2016  . Encounter for long-term (current) use of high-risk medication 01/21/2016  . Episodic lightheadedness 07/02/2017  . Gastroesophageal reflux disease without esophagitis 01/21/2016  . Hiatal hernia 01/21/2016   Last Assessment & Plan:  Relevant Hx: Course: Daily Update: Today's Plan:this is stable for her with the GERD  Electronically signed by: [name], NP 05/11/16 1428  . History of compression fracture of spine 01/21/2016  . Hypercholesteremia   . Hypertension   . Hypertension, essential 08/27/2015   Last Assessment & Plan:  Her BP readings that she brings in here are up and down, she has a pending appt with cardiology to FU on this, and she has an extreme amount of stress at home as well that is contributing to this . She and I talked about her BP meds  and she is taking a low dose of the clonidine but at this time she wants to monitor this, she is aware of how to take the losartan and is taki  . Impaired functional mobility, balance, gait, and endurance 10/19/2018  . Localized edema 01/21/2016  . Malaise and fatigue 01/21/2016   Last Assessment & Plan:  I really feel her S/S are tied to her BP and the heart rate and it not ideally being controlled for her with her inability to take multiple meds due to her S/e, she is frustrated with this and she stopped her amiodarone last Thursday, and she did not have any episodes since  august 4-5, but is taking coreg as directed  . Malignant neoplasm of right breast ([place]) 01/21/2016  . Mixed hyperlipidemia 01/21/2016   Last Assessment & Plan:  Relevant Hx: Course: Daily Update: Today's Plan:update this for her fasting  Electronically signed by: [name], NP 05/11/16 1432  . Moderate recurrent major depression ([place]) 01/21/2016   Last Assessment & Plan:  Relevant Hx: Course: Daily Update: Today's Plan:this has been stable for her  Electronically signed by: [name], NP 05/11/16 1432  . 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[place]) 09/13/2015  . 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 Osteoarthritis, generalized 01/21/2016   Last Assessment & Plan:  Relevant Hx: Course: Daily Update: Today's Plan:this is stable for her at this time  Electronically signed by: [name], NP 05/11/16 1430  . Ovarian cyst, right 01/21/2016  . Paroxysmal atrial flutter ([place]) 07/23/2016   Overview:  Chads score equals 1, prefers aspirin only Overview:  Overview:  Chads score equals 1, prefers aspirin only  . Plantar fasciitis   . Primary insomnia 04/13/2016   Last Assessment & Plan:  Relevant Hx: Course: Daily Update: Today's Plan:this has been more difficult with her working her third shift  Electronically signed by: [name], NP 05/11/16 1433  . Renal cyst, right 01/21/2016  . Swelling 07/02/2017  . 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 ANKLE SURGERY    . BREAST SURGERY    . CATARACT EXTRACTION    . CHOLECYSTECTOMY    . FOOT SURGERY     right  . SHOULDER SURGERY    . TUBAL LIGATION      Current Medications: Current Meds  Medication Sig  . alendronate (FOSAMAX) 70 MG tablet Take by mouth.  . ALPRAZolam (XANAX) 0.5 MG tablet Take 1 tablet by mouth twice a day as needed.  Refills after prescription expiration require follow-up evaluation at the  office.  Marland Kitchen atorvastatin (LIPITOR) 40 MG tablet TAKE 1 TABLET BY MOUTH ONCE (1) DAILY  . ferrous sulfate 325 (65 FE) MG EC tablet Take 325 mg by mouth daily with breakfast.  . furosemide (LASIX) 20 MG tablet Take 1 tablet (20 mg total) by mouth daily.  . hydrALAZINE (APRESOLINE) 25 MG tablet Take by mouth.  . losartan-hydrochlorothiazide (HYZAAR) 100-12.5 MG tablet Take by mouth.  Marland Kitchen omeprazole (PRILOSEC) 40 MG capsule Take 40 mg by mouth daily.  . potassium chloride 20 MEQ TBCR Take 20 mEq by mouth daily.     Allergies:   Amlodipine besylate; Buprenorphine hcl; Morphine and related; and Morphine and related   Social History   Socioeconomic History  . Marital status: Married    Spouse name: Not on file  . Number of children: Not on file  .  Years of education: Not on file  . Highest education level: Not on file  Occupational History  . Not on file  Social Needs  . Financial resource strain: Not on file  . Food insecurity:    Worry: Not on file    Inability: Not on file  . Transportation needs:    Medical: Not on file    Non-medical: Not on file  Tobacco Use  . Smoking status: Never Smoker  . Smokeless tobacco: Never Used  Substance and Sexual Activity  . Alcohol use: No  . Drug use: No  . Sexual activity: Not on file  Lifestyle  . Physical activity:    Days per week: Not on file    Minutes per session: Not on file  . Stress: Not on file  Relationships  . Social connections:    Talks on phone: Not on file    Gets together: Not on file    Attends religious service: Not on file    Active member of club or organization: Not on file    Attends meetings of clubs or organizations: Not on file    Relationship status: Not on file  Other Topics Concern  . Not on file  Social History Narrative   ** Merged History Encounter **         Family History: The patient's family history includes COPD in her father; Cancer in her mother; Heart failure in her sister; Hypertension in  her father and mother. ROS:   Please see the history of present illness.    All 14 point review of systems negative except as described per history of present illness  EKGs/Labs/Other Studies Reviewed:      Recent Labs: 10/26/2018: Hemoglobin 13.0; Platelets 225 11/15/2018: BUN 19; Creatinine, Ser 1.02; Potassium 3.6; Sodium 143  Recent Lipid Panel No results found for: CHOL, TRIG, HDL, CHOLHDL, VLDL, LDLCALC, LDLDIRECT  Physical Exam:    VS:  BP (!) 146/80   Pulse 70   Ht 5\' 6"  (1.676 m)   Wt 186 lb 6.4 oz (84.6 kg)   SpO2 96%   BMI 30.09 kg/m     Wt Readings from Last 3 Encounters:  02/23/19 186 lb 6.4 oz (84.6 kg)  11/01/18 179 lb 3.2 oz (81.3 kg)  10/21/18 180 lb 6.4 oz (81.8 kg)     GEN:  Well nourished, well developed in no acute distress HEENT: Normal NECK: No JVD; No carotid bruits LYMPHATICS: No lymphadenopathy CARDIAC: RRR, no murmurs, no rubs, no gallops RESPIRATORY:  Clear to auscultation without rales, wheezing or rhonchi  ABDOMEN: Soft, non-tender, non-distended MUSCULOSKELETAL:  No edema; No deformity  SKIN: Warm and dry LOWER EXTREMITIES: no swelling NEUROLOGIC:  Alert and oriented x 3 PSYCHIATRIC:  Normal affect   ASSESSMENT:    1. Paroxysmal atrial flutter ([place])   2. Chronic diastolic congestive heart failure, NYHA class 2 ([place])   3. Coronary artery calcification seen on CT scan   4. Hypertension, essential    PLAN:    In order of problems listed above:  1. Paroxysmal atrial flutter.  Does have some episode of narrow complex regular tachycardia on the monitor.  Will put small dose of beta-blocker and see if she will helps. 2. Chronic diastolic congestive heart failure appears to be compensated. 3. Calcification of coronaries on CT.  Overall asymptomatic. 4. Hypertension blood pressure mildly elevated hopefully with addition of small dose of beta-blockers will be better.   Medication Adjustments/Labs and Tests Ordered: Current medicines  are reviewed at length with the patient today.  Concerns regarding medicines are outlined above.  No orders of the defined types were placed in this encounter.  Medication changes:  Meds ordered this encounter  Medications  . DISCONTD: metoprolol succinate (TOPROL XL) 25 MG 24 hr tablet    Sig: Take 1 tablet (25 mg total) by mouth daily.    Dispense:  30 tablet    Refill:  2  . metoprolol succinate (TOPROL XL) 25 MG 24 hr tablet    Sig: Take 1 tablet (25 mg total) by mouth daily.    Dispense:  30 tablet    Refill:  2    Signed, Park Liter, MD, [facility] 02/23/2019 11:57 AM    [place]

## 2019-02-23 NOTE — Patient Instructions (Signed)
Medication Instructions:   START taking Toprol XL 25 mg (1 tablet) once daily   If you need a refill on your cardiac medications before your next appointment, please call your pharmacy.   Lab work: NONE If you have labs (blood work) drawn today and your tests are completely normal, you will receive your results only by: Marland Kitchen MyChart Message (if you have MyChart) OR . A paper copy in the mail If you have any lab test that is abnormal or we need to change your treatment, we will call you to review the results.  Testing/Procedures: NONE  Follow-Up: At [facility], you and your health needs are our priority.  As part of our continuing mission to provide you with exceptional heart care, we have created designated Provider Care Teams.  These Care Teams include your primary Cardiologist (physician) and Advanced Practice Providers (APPs -  Physician Assistants and Nurse Practitioners) who all work together to provide you with the care you need, when you need it. You will need a follow up appointment in 6 weeks.     Any Other Special Instructions Will Be Listed Below  Metoprolol extended-release tablets What is this medicine? METOPROLOL (me TOE proe lole) is a beta-blocker. Beta-blockers reduce the workload on the heart and help it to beat more regularly. This medicine is used to treat high blood pressure and to prevent chest pain. It is also used to after a heart attack and to prevent an additional heart attack from occurring. This medicine may be used for other purposes; ask your health care provider or pharmacist if you have questions. COMMON BRAND NAME(S): toprol, Toprol XL What should I tell my health care provider before I take this medicine? They need to know if you have any of these conditions: -diabetes -heart or vessel disease like slow heart rate, worsening heart failure, heart block, sick sinus syndrome or Raynaud's disease -kidney disease -liver disease -lung or breathing  disease, like asthma or emphysema -pheochromocytoma -thyroid disease -an unusual or allergic reaction to metoprolol, other beta-blockers, medicines, foods, dyes, or preservatives -pregnant or trying to get pregnant -breast-feeding How should I use this medicine? Take this medicine by mouth with a glass of water. Follow the directions on the prescription label. Do not crush or chew. Take this medicine with or immediately after meals. Take your doses at regular intervals. Do not take more medicine than directed. Do not stop taking this medicine suddenly. This could lead to serious heart-related effects. Talk to your pediatrician regarding the use of this medicine in children. While this drug may be prescribed for children as young as 6 years for selected conditions, precautions do apply. Overdosage: If you think you have taken too much of this medicine contact a poison control center or emergency room at once. NOTE: This medicine is only for you. Do not share this medicine with others. What if I miss a dose? If you miss a dose, take it as soon as you can. If it is almost time for your next dose, take only that dose. Do not take double or extra doses. What may interact with this medicine? This medicine may interact with the following medications: -certain medicines for blood pressure, heart disease, irregular heart beat -certain medicines for depression, like monoamine oxidase (MAO) inhibitors, fluoxetine, or paroxetine -clonidine -dobutamine -epinephrine -isoproterenol -reserpine This list may not describe all possible interactions. Give your health care provider a list of all the medicines, herbs, non-prescription drugs, or dietary supplements you use. Also tell  them if you smoke, drink alcohol, or use illegal drugs. Some items may interact with your medicine. What should I watch for while using this medicine? Visit your doctor or health care professional for regular check ups. Contact your  doctor right away if your symptoms worsen. Check your blood pressure and pulse rate regularly. Ask your health care professional what your blood pressure and pulse rate should be, and when you should contact them. You may get drowsy or dizzy. Do not drive, use machinery, or do anything that needs mental alertness until you know how this medicine affects you. Do not sit or stand up quickly, especially if you are an older patient. This reduces the risk of dizzy or fainting spells. Contact your doctor if these symptoms continue. Alcohol may interfere with the effect of this medicine. Avoid alcoholic drinks. What side effects may I notice from receiving this medicine? Side effects that you should report to your doctor or health care professional as soon as possible: -allergic reactions like skin rash, itching or hives -cold or numb hands or feet -depression -difficulty breathing -faint -fever with sore throat -irregular heartbeat, chest pain -rapid weight gain -swollen legs or ankles Side effects that usually do not require medical attention (report to your doctor or health care professional if they continue or are bothersome): -anxiety or nervousness -change in sex drive or performance -dry skin -headache -nightmares or trouble sleeping -short term memory loss -stomach upset or diarrhea -unusually tired This list may not describe all possible side effects. Call your doctor for medical advice about side effects. You may report side effects to FDA at 1-800-FDA-1088. Where should I keep my medicine? Keep out of the reach of children. Store at room temperature between 15 and 30 degrees C (59 and 86 degrees F). Throw away any unused medicine after the expiration date. NOTE: This sheet is a summary. It may not cover all possible information. If you have questions about this medicine, talk to your doctor, pharmacist, or health care provider.  2019 Elsevier/Gold Standard (2013-08-11 14:41:37)

## 2019-03-31 ENCOUNTER — Telehealth: Payer: Self-pay | Admitting: Cardiology

## 2019-03-31 NOTE — Telephone Encounter (Signed)
YOUR CARDIOLOGY TEAM HAS ARRANGED FOR AN E-VISIT FOR YOUR APPOINTMENT - PLEASE REVIEW IMPORTANT INFORMATION BELOW SEVERAL DAYS PRIOR TO YOUR APPOINTMENT  Due to the recent COVID-19 pandemic, we are transitioning in-person office visits to tele-medicine visits in an effort to decrease unnecessary exposure to our patients and staff. Medicare and most insurances are covering these visits without a copay needed. We also encourage you to sign up for MyChart if you have not already done so. You will need a smartphone if possible. For patients that do not have this, we can still complete the visit using a regular telephone but do prefer a smartphone to enable video when possible. You may have a close family member that lives with you that can help. If possible, we also ask that you have a blood pressure cuff and scale at home to measure your blood pressure, heart rate and weight prior to your scheduled appointment. Patients with clinical needs that need an in-person evaluation and testing will still be able to come to the office if absolutely necessary. If you have any questions, feel free to call our office.    THE DAY OF YOUR APPOINTMENT  Approximately 15 minutes prior to your scheduled appointment, you will receive a telephone call from one of [place] team - your caller ID may say "Unknown caller."  Our staff will confirm medications, vital signs for the day and any symptoms you may be experiencing. Please have this information available prior to the time of visit start. It may also be helpful for you to have a pad of paper and pen handy for any instructions given during your visit. They will also walk you through joining the WebEx smartphone meeting if this is a video visit.    CONSENT FOR TELE-HEALTH VISIT - PLEASE REVIEW  I hereby voluntarily request, consent and authorize [facility] and its employed or contracted physicians, physician assistants, nurse practitioners or other licensed health care  professionals (the Practitioner), to provide me with telemedicine health care services (the Services") as deemed necessary by the treating Practitioner. I acknowledge and consent to receive the Services by the Practitioner via telemedicine. I understand that the telemedicine visit will involve communicating with the Practitioner through live audiovisual communication technology and the disclosure of certain medical information by electronic transmission. I acknowledge that I have been given the opportunity to request an in-person assessment or other available alternative prior to the telemedicine visit and am voluntarily participating in the telemedicine visit.  I understand that I have the right to withhold or withdraw my consent to the use of telemedicine in the course of my care at any time, without affecting my right to future care or treatment, and that the Practitioner or I may terminate the telemedicine visit at any time. I understand that I have the right to inspect all information obtained and/or recorded in the course of the telemedicine visit and may receive copies of available information for a reasonable fee.  I understand that some of the potential risks of receiving the Services via telemedicine include:   Delay or interruption in medical evaluation due to technological equipment failure or disruption;  Information transmitted may not be sufficient (e.g. poor resolution of images) to allow for appropriate medical decision making by the Practitioner; and/or   In rare instances, security protocols could fail, causing a breach of personal health information.  Furthermore, I acknowledge that it is my responsibility to provide information about my medical history, conditions and care that is complete and  accurate to the best of my ability. I acknowledge that Practitioner's advice, recommendations, and/or decision may be based on factors not within their control, such as incomplete or inaccurate  data provided by me or distortions of diagnostic images or specimens that may result from electronic transmissions. I understand that the practice of medicine is not an exact science and that Practitioner makes no warranties or guarantees regarding treatment outcomes. I acknowledge that I will receive a copy of this consent concurrently upon execution via email to the email address I last provided but may also request a printed copy by calling the office of [facility].    I understand that my insurance will be billed for this visit.   I have read or had this consent read to me.  I understand the contents of this consent, which adequately explains the benefits and risks of the Services being provided via telemedicine.   I have been provided ample opportunity to ask questions regarding this consent and the Services and have had my questions answered to my satisfaction.  I give my informed consent for the services to be provided through the use of telemedicine in my medical care  By participating in this telemedicine visit I agree to the above.  Patient gives verbal consent for televisit 03/31/2019 pp

## 2019-04-06 ENCOUNTER — Encounter: Payer: Self-pay | Admitting: Cardiology

## 2019-04-06 ENCOUNTER — Telehealth (INDEPENDENT_AMBULATORY_CARE_PROVIDER_SITE_OTHER): Payer: Medicare Other | Admitting: Cardiology

## 2019-04-06 ENCOUNTER — Other Ambulatory Visit: Payer: Self-pay

## 2019-04-06 VITALS — BP 136/72 | HR 80 | Wt 180.0 lb

## 2019-04-06 DIAGNOSIS — I10 Essential (primary) hypertension: Secondary | ICD-10-CM

## 2019-04-06 DIAGNOSIS — I5032 Chronic diastolic (congestive) heart failure: Secondary | ICD-10-CM

## 2019-04-06 DIAGNOSIS — I4892 Unspecified atrial flutter: Secondary | ICD-10-CM

## 2019-04-06 DIAGNOSIS — I2510 Atherosclerotic heart disease of native coronary artery without angina pectoris: Secondary | ICD-10-CM

## 2019-04-06 DIAGNOSIS — K219 Gastro-esophageal reflux disease without esophagitis: Secondary | ICD-10-CM

## 2019-04-06 MED ORDER — POTASSIUM CHLORIDE ER 20 MEQ PO TBCR: 20.0000 meq | EXTENDED_RELEASE_TABLET | Freq: Every day | ORAL | 1 refills | Status: DC

## 2019-04-06 MED ORDER — LOSARTAN POTASSIUM-HCTZ 100-12.5 MG PO TABS: 1.0000 | ORAL_TABLET | Freq: Every day | ORAL | 1 refills | Status: DC

## 2019-04-06 NOTE — Progress Notes (Signed)
Virtual Visit via Video Note   This visit type was conducted due to national recommendations for restrictions regarding the COVID-19 Pandemic (e.g. social distancing) in an effort to limit this patient's exposure and mitigate transmission in our community.  Due to her co-morbid illnesses, this patient is at least at moderate risk for complications without adequate follow up.  This format is felt to be most appropriate for this patient at this time.  All issues noted in this document were discussed and addressed.  A limited physical exam was performed with this format.  Please refer to the patient's chart for her consent to telehealth for [facility].  Evaluation Performed:  Follow-up visit  This visit type was conducted due to national recommendations for restrictions regarding the COVID-19 Pandemic (e.g. social distancing).  This format is felt to be most appropriate for this patient at this time.  All issues noted in this document were discussed and addressed.  No physical exam was performed (except for noted visual exam findings with Video Visits).  Please refer to the patient's chart (MyChart message for video visits and phone note for telephone visits) for the patient's consent to telehealth for [facility].  Date:  04/06/2019  ID: Briana Barrett, DOB 06/02/1946, MRN [id number]   Patient Location:  [place] [id number]   Provider location:   [place] Office  PCP:  [name], MD  Cardiologist:  [name], MD     Chief Complaint: I am doing well  History of Present Illness:    Briana Barrett is a 73 y.o. female  who presents via audio/video conferencing for a telehealth visit today.  With paroxysmal atrial flutter, diastolic congestive heart rate, hypertension, dyslipidemia we have video visit today to continue conversation about anticoagulation.  She still refused she was not able to afford Eliquis and we spent  almost entire visit today talking about need to take anticoagulation.  We talked about Coumadin and she still undecided she still will not think about it she is worried about taking too many medication she is worried about easy bruising.  I told her that the reason for taking this medications prevent her from having stroke but she still does not want to do it.  Otherwise doing well denies have any chest pain, tightness, pressure, burning in the chest no palpitations.  It looks like addition of beta-blocker seems to be helping her.   The patient does not have symptoms concerning for COVID-19 infection (fever, chills, cough, or new SHORTNESS OF BREATH).    Prior CV studies:   The following studies were reviewed today:       Past Medical History:  Diagnosis Date  . Age-related osteoporosis without current pathological fracture 01/21/2016   Last Assessment & Plan:  Relevant Hx: Course: Daily Update: Today's Plan:she feels this is stable for her   Electronically signed by: [name], NP 05/11/16 1430  . Allergic rhinitis 01/21/2016   Last Assessment & Plan:  Relevant Hx: Course: Daily Update: Today's Plan:this is stable for her  Electronically signed by: [name], NP 05/11/16 1427  . Anemia, iron deficiency 01/21/2016   Last Assessment & Plan:  Her last iron level was 68 and she is taking the iron daily see her CBC  . Anxiety 04/13/2016   Last Assessment & Plan:  She is taking the xanax more daily and not her zoloft and she thinks it helps her more  .  Bradycardia 07/02/2017  . Calcification of lung 01/21/2016  . Coronary artery calcification seen on CT scan 01/21/2016  . DDD (degenerative disc disease), lumbosacral 01/21/2016   Last Assessment & Plan:  Relevant Hx: Course: Daily Update: Today's Plan:she is working again at third shift at the [employer] and she is on her feet and that is making this worse for her  Electronically signed by: [name], NP 05/11/16 1429  . Diabetes mellitus type 2, controlled ([place]) 01/21/2016   Last Assessment & Plan:  She does not check her sugar as her last average was good for her   . Dyslipidemia 07/23/2016  . Encounter for long-term (current) use of high-risk medication 01/21/2016  . Episodic lightheadedness 07/02/2017  . Gastroesophageal reflux disease without esophagitis 01/21/2016  . Hiatal hernia 01/21/2016   Last Assessment & Plan:  Relevant Hx: Course: Daily Update: Today's Plan:this is stable for her with the GERD  Electronically signed by: [name], NP 05/11/16 1428  . History of compression fracture of spine 01/21/2016  . Hypercholesteremia   . Hypertension   . Hypertension, essential 08/27/2015   Last Assessment & Plan:  Her BP readings that she brings in here are up and down, she has a pending appt with cardiology to FU on this, and she has an extreme amount of stress at home as well that is contributing to this . She and I talked about her BP meds and she is taking a low dose of the clonidine but at this time she wants to monitor this, she is aware of how to take the losartan and is taki  . Impaired functional mobility, balance, gait, and endurance 10/19/2018  . Localized edema 01/21/2016  . Malaise and fatigue 01/21/2016   Last Assessment & Plan:  I really feel her S/S are tied to her BP and the heart rate and it not ideally being controlled for her with her inability to take multiple meds due to her S/e, she is frustrated with this and she stopped her amiodarone last Thursday, and she did not have any episodes since august 4-5, but is taking coreg as directed  . Malignant neoplasm of right breast ([place]) 01/21/2016  . Mixed hyperlipidemia 01/21/2016   Last Assessment & Plan:  Relevant Hx: Course: Daily Update: Today's Plan:update this for her fasting  Electronically signed by: [name], NP 05/11/16 1432  . Moderate recurrent major depression ([place]) 01/21/2016    Last Assessment & Plan:  Relevant Hx: Course: Daily Update: Today's Plan:this has been stable for her  Electronically signed by: [name], NP 05/11/16 1432  . 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[place]) 09/13/2015  . 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 Osteoarthritis, generalized 01/21/2016   Last Assessment & Plan:  Relevant Hx: Course: Daily Update: Today's Plan:this is stable for her at this time  Electronically signed by: [name], NP 05/11/16 1430  . Ovarian cyst, right 01/21/2016  . Paroxysmal atrial flutter ([place]) 07/23/2016   Overview:  Chads score equals 1, prefers aspirin only Overview:  Overview:  Chads score equals 1, prefers aspirin only  . Plantar fasciitis   . Primary insomnia 04/13/2016   Last Assessment & Plan:  Relevant Hx: Course: Daily Update: Today's Plan:this has been more difficult with her working her third shift  Electronically signed by: [name], NP 05/11/16 1433  . Renal cyst, right 01/21/2016  . Swelling 07/02/2017  . Thoracic degenerative  disc disease 06/16/2018    Past Surgical History:  Procedure Laterality Date  . ANKLE SURGERY    . BREAST SURGERY    . CATARACT EXTRACTION    . CHOLECYSTECTOMY    . FOOT SURGERY     right  . SHOULDER SURGERY    . TUBAL LIGATION       Current Meds  Medication Sig  . alendronate (FOSAMAX) 70 MG tablet Take by mouth.  . ALPRAZolam (XANAX) 0.5 MG tablet Take 1 tablet by mouth twice a day as needed.  Refills after prescription expiration require follow-up evaluation at the office.  Marland Kitchen atorvastatin (LIPITOR) 40 MG tablet TAKE 1 TABLET BY MOUTH ONCE (1) DAILY  . ferrous sulfate 325 (65 FE) MG EC tablet Take 325 mg by mouth daily with breakfast.  . furosemide (LASIX) 20 MG tablet Take 1 tablet (20 mg total) by mouth daily. (Patient taking differently: Take 20 mg by mouth every other day. )  . hydrALAZINE (APRESOLINE) 25 MG tablet Take 25 mg by mouth 3  (three) times daily.   Marland Kitchen losartan-hydrochlorothiazide (HYZAAR) 100-12.5 MG tablet Take by mouth.  . metoprolol succinate (TOPROL XL) 25 MG 24 hr tablet Take 1 tablet (25 mg total) by mouth daily.  Marland Kitchen omeprazole (PRILOSEC) 40 MG capsule Take 40 mg by mouth daily.  . potassium chloride 20 MEQ TBCR Take 20 mEq by mouth daily.      Family History: The patient's family history includes COPD in her father; Cancer in her mother; Heart failure in her sister; Hypertension in her father and mother.   ROS:   Please see the history of present illness.     All other systems reviewed and are negative.   Labs/Other Tests and Data Reviewed:     Recent Labs: 10/26/2018: Hemoglobin 13.0; Platelets 225 11/15/2018: BUN 19; Creatinine, Ser 1.02; Potassium 3.6; Sodium 143  Recent Lipid Panel No results found for: CHOL, TRIG, HDL, CHOLHDL, VLDL, LDLCALC, LDLDIRECT    Exam:    Vital Signs:  BP 136/72   Pulse 80   Wt 180 lb (81.6 kg)   BMI 29.05 kg/m     Wt Readings from Last 3 Encounters:  04/06/19 180 lb (81.6 kg)  02/23/19 186 lb 6.4 Barrett (84.6 kg)  11/01/18 179 lb 3.2 Barrett (81.3 kg)     Well nourished, well developed female in no acute distress. There is no JVD.  Her affect and mood is appropriate.  Diagnosis for this visit:   1. Paroxysmal atrial flutter ([place])   2. Chronic diastolic congestive heart failure, NYHA class 2 ([place])   3. Coronary artery calcification seen on CT scan   4. Hypertension, essential   5. Gastroesophageal reflux disease without esophagitis      ASSESSMENT & PLAN:    1.  Paroxysmal atrial flutter still refused anticoagulation will continue this discussion her chads 2 Vascor equals 3. 2.  Chronic congestive heart failure appears to be compensated we will continue present management. 3.  Coronary artery disease with calcification of coronaries on the CT.  Asymptomatic we will continue present management. 4.  Essential hypertension doing well from that point  review. 5.  Gastroesophageal reflux disease.  She refused to take any aspirin.  COVID-19 Education: The signs and symptoms of COVID-19 were discussed with the patient and how to seek care for testing (follow up with PCP or arrange E-visit).  The importance of social distancing was discussed today.  Patient Risk:   After full review of this  patients clinical status, I feel that they are at least moderate risk at this time.  Time:   Today, I have spent 17 minutes with the patient with telehealth technology discussing pt health issues. Visit was finished at 11:20 AM.    Medication Adjustments/Labs and Tests Ordered: Current medicines are reviewed at length with the patient today.  Concerns regarding medicines are outlined above.  No orders of the defined types were placed in this encounter.  Medication changes: No orders of the defined types were placed in this encounter.    Disposition: 1 month follow-up video visit to continue discussion about anticoagulation  Signed, Park Liter, MD, [facility] 04/06/2019 11:20 AM    [place]

## 2019-04-06 NOTE — Patient Instructions (Signed)
Medication Instructions:  Your physician recommends that you continue on your current medications as directed. Please refer to the Current Medication list given to you today.  If you need a refill on your cardiac medications before your next appointment, please call your pharmacy.   Lab work: None.  If you have labs (blood work) drawn today and your tests are completely normal, you will receive your results only by: . MyChart Message (if you have MyChart) OR . A paper copy in the mail If you have any lab test that is abnormal or we need to change your treatment, we will call you to review the results.  Testing/Procedures: None.   Follow-Up: At [facility], you and your health needs are our priority.  As part of our continuing mission to provide you with exceptional heart care, we have created designated Provider Care Teams.  These Care Teams include your primary Cardiologist (physician) and Advanced Practice Providers (APPs -  Physician Assistants and Nurse Practitioners) who all work together to provide you with the care you need, when you need it. You will need a follow up appointment in 1 months.  Please call our office 2 months in advance to schedule this appointment.  You may see Robert Krasowski, MD or another member of our [facility] Provider Team in [place]: Brian Munley, MD . Rajan Revankar, MD  Any Other Special Instructions Will Be Listed Below (If Applicable).     

## 2019-05-12 ENCOUNTER — Telehealth: Payer: Medicare Other | Admitting: Cardiology

## 2019-06-05 ENCOUNTER — Other Ambulatory Visit: Payer: Self-pay | Admitting: Cardiology

## 2019-09-21 ENCOUNTER — Other Ambulatory Visit: Payer: Self-pay | Admitting: Sports Medicine

## 2019-09-21 ENCOUNTER — Ambulatory Visit: Payer: Medicare Other | Admitting: Sports Medicine

## 2019-09-21 DIAGNOSIS — M79671 Pain in right foot: Secondary | ICD-10-CM

## 2019-09-21 DIAGNOSIS — M79672 Pain in left foot: Secondary | ICD-10-CM

## 2019-09-22 ENCOUNTER — Ambulatory Visit: Payer: Medicare Other | Admitting: Sports Medicine

## 2019-10-10 ENCOUNTER — Other Ambulatory Visit: Payer: Self-pay | Admitting: Cardiology

## 2019-10-17 ENCOUNTER — Other Ambulatory Visit: Payer: Self-pay | Admitting: Cardiology

## 2019-12-11 DIAGNOSIS — C50511 Malignant neoplasm of lower-outer quadrant of right female breast: Secondary | ICD-10-CM | POA: Diagnosis not present

## 2020-04-12 DIAGNOSIS — F332 Major depressive disorder, recurrent severe without psychotic features: Secondary | ICD-10-CM

## 2020-04-12 HISTORY — DX: Major depressive disorder, recurrent severe without psychotic features: F33.2

## 2020-06-21 ENCOUNTER — Telehealth: Payer: Self-pay | Admitting: Emergency Medicine

## 2020-06-21 NOTE — Telephone Encounter (Signed)
   Primary Cardiologist: [name], MD  Chart reviewed as part of pre-operative protocol coverage. Given past medical history and time since last visit, based on ACC/AHA guidelines, Shane Melby would be at acceptable risk for the planned procedure without further cardiovascular testing.   I will route this recommendation to the requesting party via Epic fax function and remove from pre-op pool.  Please call with questions.  Jossie Ng. Cierra Rothgeb NP-C    06/21/2020, 8:53 AM [address] Office [phone] Fax [phone]

## 2020-06-21 NOTE — Telephone Encounter (Signed)
   [facility] Pre-operative Risk Assessment    HEARTCARE STAFF: - Please ensure there is not already an duplicate clearance open for this procedure. - Under Visit Info/Reason for Call, type in Other and utilize the format Clearance MM/DD/YY or Clearance TBD. Do not use dashes or single digits. - If request is for dental extraction, please clarify the # of teeth to be extracted.  Request for surgical clearance:  1. What type of surgery is being performed? Left knee arthroscopy    2. When is this surgery scheduled? Not scheduled yet    3. What type of clearance is required (medical clearance vs. Pharmacy clearance to hold med vs. Both)? Medical   4. Are there any medications that need to be held prior to surgery and how long? None specified    5. Practice name and name of physician performing surgery? [facility]    6. What is the office phone number? [phone]   7.   What is the office fax number? [phone]  8.   Anesthesia type (None, local, MAC, general) ? General    Markian Glockner R Darlynn Ricco 06/21/2020, 8:21 AM  _________________________________________________________________   (provider comments below)

## 2020-07-21 HISTORY — PX: SKIN SURGERY: SHX2413

## 2020-07-23 ENCOUNTER — Ambulatory Visit (INDEPENDENT_AMBULATORY_CARE_PROVIDER_SITE_OTHER): Payer: Medicare Other | Admitting: Cardiology

## 2020-07-23 ENCOUNTER — Encounter: Payer: Self-pay | Admitting: Cardiology

## 2020-07-23 ENCOUNTER — Other Ambulatory Visit: Payer: Self-pay

## 2020-07-23 VITALS — BP 136/98 | HR 83 | Ht 66.0 in | Wt 183.0 lb

## 2020-07-23 DIAGNOSIS — I2510 Atherosclerotic heart disease of native coronary artery without angina pectoris: Secondary | ICD-10-CM | POA: Diagnosis not present

## 2020-07-23 DIAGNOSIS — I10 Essential (primary) hypertension: Secondary | ICD-10-CM

## 2020-07-23 DIAGNOSIS — I5032 Chronic diastolic (congestive) heart failure: Secondary | ICD-10-CM | POA: Diagnosis not present

## 2020-07-23 DIAGNOSIS — I4892 Unspecified atrial flutter: Secondary | ICD-10-CM | POA: Diagnosis not present

## 2020-07-23 NOTE — Patient Instructions (Signed)
Medication Instructions:  Your physician recommends that you continue on your current medications as directed. Please refer to the Current Medication list given to you today.  *If you need a refill on your cardiac medications before your next appointment, please call your pharmacy*   Lab Work: Your physician recommends that you return for lab work today: cbc, bmp If you have labs (blood work) drawn today and your tests are completely normal, you will receive your results only by: Marland Kitchen MyChart Message (if you have MyChart) OR . A paper copy in the mail If you have any lab test that is abnormal or we need to change your treatment, we will call you to review the results.   Testing/Procedures: Your physician has requested that you have an echocardiogram. Echocardiography is a painless test that uses sound waves to create images of your heart. It provides your doctor with information about the size and shape of your heart and how well your heart's chambers and valves are working. This procedure takes approximately one hour. There are no restrictions for this procedure.     Follow-Up: At [facility], you and your health needs are our priority.  As part of our continuing mission to provide you with exceptional heart care, we have created designated Provider Care Teams.  These Care Teams include your primary Cardiologist (physician) and Advanced Practice Providers (APPs -  Physician Assistants and Nurse Practitioners) who all work together to provide you with the care you need, when you need it.  We recommend signing up for the patient portal called "MyChart".  Sign up information is provided on this After Visit Summary.  MyChart is used to connect with patients for Virtual Visits (Telemedicine).  Patients are able to view lab/test results, encounter notes, upcoming appointments, etc.  Non-urgent messages can be sent to your provider as well.   To learn more about what you can do with MyChart, go to  [email or web address].    Your next appointment:   2 month(s)  The format for your next appointment:   In Person  Provider:   [name], MD   Other Instructions   Echocardiogram An echocardiogram is a procedure that uses painless sound waves (ultrasound) to produce an image of the heart. Images from an echocardiogram can provide important information about:  Signs of coronary artery disease (CAD).  Aneurysm detection. An aneurysm is a weak or damaged part of an artery wall that bulges out from the normal force of blood pumping through the body.  Heart size and shape. Changes in the size or shape of the heart can be associated with certain conditions, including heart failure, aneurysm, and CAD.  Heart muscle function.  Heart valve function.  Signs of a past heart attack.  Fluid buildup around the heart.  Thickening of the heart muscle.  A tumor or infectious growth around the heart valves. Tell a health care provider about:  Any allergies you have.  All medicines you are taking, including vitamins, herbs, eye drops, creams, and over-the-counter medicines.  Any blood disorders you have.  Any surgeries you have had.  Any medical conditions you have.  Whether you are pregnant or may be pregnant. What are the risks? Generally, this is a safe procedure. However, problems may occur, including:  Allergic reaction to dye (contrast) that may be used during the procedure. What happens before the procedure? No specific preparation is needed. You may eat and drink normally. What happens during the procedure?   An IV tube may  be inserted into one of your veins.  You may receive contrast through this tube. A contrast is an injection that improves the quality of the pictures from your heart.  A gel will be applied to your chest.  A wand-like tool (transducer) will be moved over your chest. The gel will help to transmit the sound waves from the  transducer.  The sound waves will harmlessly bounce off of your heart to allow the heart images to be captured in real-time motion. The images will be recorded on a computer. The procedure may vary among health care providers and hospitals. What happens after the procedure?  You may return to your normal, everyday life, including diet, activities, and medicines, unless your health care provider tells you not to do that. Summary  An echocardiogram is a procedure that uses painless sound waves (ultrasound) to produce an image of the heart.  Images from an echocardiogram can provide important information about the size and shape of your heart, heart muscle function, heart valve function, and fluid buildup around your heart.  You do not need to do anything to prepare before this procedure. You may eat and drink normally.  After the echocardiogram is completed, you may return to your normal, everyday life, unless your health care provider tells you not to do that. This information is not intended to replace advice given to you by your health care provider. Make sure you discuss any questions you have with your health care provider. Document Revised: 03/30/2019 Document Reviewed: 01/09/2017 Elsevier Patient Education  [place].

## 2020-07-23 NOTE — Progress Notes (Signed)
Cardiology Office Note:    Date:  07/23/2020   ID:  Briana Barrett, DOB 1946-12-15, MRN [id number]  PCP:  [name], MD  Cardiologist:  [name], MD    Referring MD: [name], MD   No chief complaint on file. I am doing fine  History of Present Illness:    Briana Barrett is a 74 y.o. female with past medical history significant for paroxysmal atrial fibrillation, refused anticoagulation because of price, essential hypertension, dyslipidemia.  Comes today 2 months for follow-up.  She lost her husband of 18 years just a few months ago and she is still grieving after that.  She is being scheduled for potentially for having left knee surgery which will be arthroscopic surgery.  She would like to have risk assessment before that.  However, EKG today showed presence of atrial fibrillation with controlled ventricular rate.  She does not feel it denies have any chest pain tightness squeezing pressure burning chest.  Past Medical History:  Diagnosis Date  . Age-related osteoporosis without current pathological fracture 01/21/2016   Last Assessment & Plan:  Relevant Hx: Course: Daily Update: Today's Plan:she feels this is stable for her   Electronically signed by: Briana Camel, NP 05/11/16 1430  . Allergic rhinitis 01/21/2016   Last Assessment & Plan:  Relevant Hx: Course: Daily Update: Today's Plan:this is stable for her  Electronically signed by: Briana Camel, NP 05/11/16 1427  . Anemia, iron deficiency 01/21/2016   Last Assessment & Plan:  Her last iron level was 68 and she is taking the iron daily see her CBC  . Anxiety 04/13/2016   Last Assessment & Plan:  She is taking the xanax more daily and not her zoloft and she thinks it helps her more  . Bradycardia 07/02/2017  . Calcification of lung 01/21/2016  . Coronary artery calcification seen on CT scan 01/21/2016  . DDD (degenerative disc disease), lumbosacral 01/21/2016   Last Assessment  & Plan:  Relevant Hx: Course: Daily Update: Today's Plan:she is working again at third shift at the [employer] and she is on her feet and that is making this worse for her  Electronically signed by: Briana Camel, NP 05/11/16 1429  . Diabetes mellitus type 2, controlled ([place]) 01/21/2016   Last Assessment & Plan:  She does not check her sugar as her last average was good for her   . Dyslipidemia 07/23/2016  . Encounter for long-term (current) use of high-risk medication 01/21/2016  . Episodic lightheadedness 07/02/2017  . Gastroesophageal reflux disease without esophagitis 01/21/2016  . Hiatal hernia 01/21/2016   Last Assessment & Plan:  Relevant Hx: Course: Daily Update: Today's Plan:this is stable for her with the GERD  Electronically signed by: Briana Camel, NP 05/11/16 1428  . History of compression fracture of spine 01/21/2016  . Hypercholesteremia   . Hypertension   . Hypertension, essential 08/27/2015   Last Assessment & Plan:  Her BP readings that she brings in here are up and down, she has a pending appt with cardiology to FU on this, and she has an extreme amount of stress at home as well that is contributing to this . She and I talked about her BP meds and she is taking a low dose of the clonidine but at this time she wants to monitor this, she is aware of how to take the losartan and is taki  . Impaired functional mobility, balance, gait, and endurance 10/19/2018  .  Localized edema 01/21/2016  . Malaise and fatigue 01/21/2016   Last Assessment & Plan:  I really feel her S/S are tied to her BP and the heart rate and it not ideally being controlled for her with her inability to take multiple meds due to her S/e, she is frustrated with this and she stopped her amiodarone last Thursday, and she did not have any episodes since august 4-5, but is taking coreg as directed  . Malignant neoplasm of right breast ([place]) 01/21/2016  . Mixed hyperlipidemia 01/21/2016   Last  Assessment & Plan:  Relevant Hx: Course: Daily Update: Today's Plan:update this for her fasting  Electronically signed by: Briana Camel, NP 05/11/16 1432  . Moderate recurrent major depression ([place]) 01/21/2016   Last Assessment & Plan:  Relevant Hx: Course: Daily Update: Today's Plan:this has been stable for her  Electronically signed by: Briana Camel, NP 05/11/16 1432  . 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[place]) 09/13/2015  . 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 Osteoarthritis, generalized 01/21/2016   Last Assessment & Plan:  Relevant Hx: Course: Daily Update: Today's Plan:this is stable for her at this time  Electronically signed by: Briana Camel, NP 05/11/16 1430  . Ovarian cyst, right 01/21/2016  . Paroxysmal atrial flutter ([place]) 07/23/2016   Overview:  Chads score equals 1, prefers aspirin only Overview:  Overview:  Chads score equals 1, prefers aspirin only  . Plantar fasciitis   . Primary insomnia 04/13/2016   Last Assessment & Plan:  Relevant Hx: Course: Daily Update: Today's Plan:this has been more difficult with her working her third shift  Electronically signed by: Briana Camel, NP 05/11/16 1433  . Renal cyst, right 01/21/2016  . Swelling 07/02/2017  . 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 ANKLE SURGERY    . BREAST SURGERY    . CATARACT EXTRACTION    . CHOLECYSTECTOMY    . FOOT SURGERY     right  . SHOULDER SURGERY    . TUBAL LIGATION      Current Medications: Current Meds  Medication Sig  . ALPRAZolam (XANAX) 0.5 MG tablet Take 1 tablet by mouth twice a day as needed.  Refills after prescription expiration require follow-up evaluation at the office.  Marland Kitchen atorvastatin (LIPITOR) 40 MG tablet TAKE 1 TABLET BY MOUTH ONCE (1) DAILY  . hydrALAZINE (APRESOLINE) 25 MG tablet Take 25 mg by mouth 3 (three) times daily.   Marland Kitchen  losartan-hydrochlorothiazide (HYZAAR) 100-12.5 MG tablet Take 1 tablet by mouth once daily  . nitrofurantoin, macrocrystal-monohydrate, (MACROBID) 100 MG capsule Take 1 capsule by mouth 2 (two) times daily.  Marland Kitchen omeprazole (PRILOSEC) 40 MG capsule Take 40 mg by mouth daily.  . Potassium Chloride ER 20 MEQ TBCR Take 20 mEq by mouth daily.     Allergies:   Amlodipine besylate, Buprenorphine hcl, Morphine and related, and Morphine and related   Social History   Socioeconomic History  . Marital status: Married    Spouse name: Not on file  . Number of children: Not on file  . Years of education: Not on file  . Highest education level: Not on file  Occupational History  . Not on file  Tobacco Use  . Smoking status: Never Smoker  . Smokeless tobacco: Never Used  Vaping Use  . Vaping Use: Never used  Substance and Sexual Activity  . Alcohol use: No  .  Drug use: No  . Sexual activity: Not on file  Other Topics Concern  . Not on file  Social History Narrative   ** Merged History Encounter **       Social Determinants of Health   Financial Resource Strain:   . Difficulty of Paying Living Expenses:   Food Insecurity:   . Worried About Charity fundraiser in the Last Year:   . Arboriculturist in the Last Year:   Transportation Needs:   . Film/video editor (Medical):   Marland Kitchen Lack of Transportation (Non-Medical):   Physical Activity:   . Days of Exercise per Week:   . Minutes of Exercise per Session:   Stress:   . Feeling of Stress :   Social Connections:   . Frequency of Communication with Friends and Family:   . Frequency of Social Gatherings with Friends and Family:   . Attends Religious Services:   . Active Member of Clubs or Organizations:   . Attends Archivist Meetings:   Marland Kitchen Marital Status:      Family History: The patient's family history includes COPD in her father; Cancer in her mother; Heart failure in her sister; Hypertension in her father and  mother. ROS:   Please see the history of present illness.    All 14 point review of systems negative except as described per history of present illness  EKGs/Labs/Other Studies Reviewed:      Recent Labs: No results found for requested labs within last 8760 hours.  Recent Lipid Panel No results found for: CHOL, TRIG, HDL, CHOLHDL, VLDL, LDLCALC, LDLDIRECT  Physical Exam:    VS:  BP (!) 136/98 (BP Location: Left Arm, Patient Position: Sitting, Cuff Size: Normal)   Pulse 83   Ht 5\' 6"  (1.676 m)   Wt 183 lb (83 kg)   SpO2 98%   BMI 29.54 kg/m     Wt Readings from Last 3 Encounters:  07/23/20 183 lb (83 kg)  04/06/19 180 lb (81.6 kg)  02/23/19 186 lb 6.4 oz (84.6 kg)     GEN:  Well nourished, well developed in no acute distress HEENT: Normal NECK: No JVD; No carotid bruits LYMPHATICS: No lymphadenopathy CARDIAC: Irregularly irregular, no murmurs, no rubs, no gallops RESPIRATORY:  Clear to auscultation without rales, wheezing or rhonchi  ABDOMEN: Soft, non-tender, non-distended MUSCULOSKELETAL:  No edema; No deformity  SKIN: Warm and dry LOWER EXTREMITIES: no swelling NEUROLOGIC:  Alert and oriented x 3 PSYCHIATRIC:  Normal affect   ASSESSMENT:    1. Paroxysmal atrial flutter ([place])   2. Hypertension, essential   3. Chronic diastolic congestive heart failure, NYHA class 2 ([place])   4. Coronary artery calcification seen on CT scan    PLAN:    In order of problems listed above:  1. Paroxysmal atrial fibrillation she is in atrial fibrillation today with controlled ventricular rate.  Her chads 2 Vascor is high enough to justify anticoagulation, she is willing to try again, therefore I will check her CBC and Chem-7 if that is fine will initiate Eliquis 5 mg twice daily.  Before the problem was price it was simply too expensive, she was not able to afford it. 2. Coronary disease in form of calcification of CT.  Asymptomatic.  Last stress test was negative. 3. Essential  hypertension blood pressure slightly elevated but she is very emotional because of her husband passing. 4. Chronic congestive heart failure.  Compensated.   Medication Adjustments/Labs and Tests Ordered:  Current medicines are reviewed at length with the patient today.  Concerns regarding medicines are outlined above.  No orders of the defined types were placed in this encounter.  Medication changes: No orders of the defined types were placed in this encounter.   Signed, Park Liter, MD, [facility] 07/23/2020 4:34 PM    [place]

## 2020-07-24 LAB — BASIC METABOLIC PANEL
BUN/Creatinine Ratio: 16 (ref 12–28)
BUN: 16 mg/dL (ref 8–27)
CO2: 26 mmol/L (ref 20–29)
Calcium: 9 mg/dL (ref 8.7–10.3)
Chloride: 104 mmol/L (ref 96–106)
Creatinine, Ser: 0.99 mg/dL (ref 0.57–1.00)
GFR calc Af Amer: 64 mL/min/{1.73_m2} (ref >59)
GFR calc non Af Amer: 56 mL/min/{1.73_m2} — ABNORMAL LOW (ref >59)
Glucose: 94 mg/dL (ref 65–99)
Potassium: 4.2 mmol/L (ref 3.5–5.2)
Sodium: 143 mmol/L (ref 134–144)

## 2020-07-24 LAB — CBC
Hematocrit: 46.9 % — ABNORMAL HIGH (ref 34.0–46.6)
Hemoglobin: 15.4 g/dL (ref 11.1–15.9)
MCH: 27.8 pg (ref 26.6–33.0)
MCHC: 32.8 g/dL (ref 31.5–35.7)
MCV: 85 fL (ref 79–97)
Platelets: 259 10*3/uL (ref 150–450)
RBC: 5.54 x10E6/uL — ABNORMAL HIGH (ref 3.77–5.28)
RDW: 11.8 % (ref 11.7–15.4)
WBC: 7.6 10*3/uL (ref 3.4–10.8)

## 2020-07-25 ENCOUNTER — Telehealth: Payer: Self-pay | Admitting: Emergency Medicine

## 2020-07-25 NOTE — Telephone Encounter (Signed)
Called patient. Informed her of results and to start eliquis 5 mg twice daily. She reports she has tried this before but she can't afford it. She is worried about it not being covered if she can't get patient assistance her husband just passed away and she can't do this right now. She reports she is still trying to figure out bills without him here and she will just have to let us know if she is able to take it later. Will inform Dr. Agustin Cree.

## 2020-08-19 ENCOUNTER — Other Ambulatory Visit: Payer: Self-pay

## 2020-08-19 ENCOUNTER — Ambulatory Visit (INDEPENDENT_AMBULATORY_CARE_PROVIDER_SITE_OTHER): Payer: Medicare Other

## 2020-08-19 DIAGNOSIS — I4892 Unspecified atrial flutter: Secondary | ICD-10-CM | POA: Diagnosis not present

## 2020-08-19 DIAGNOSIS — I10 Essential (primary) hypertension: Secondary | ICD-10-CM

## 2020-08-19 LAB — ECHOCARDIOGRAM COMPLETE
Area-P 1/2: 2.99 cm2
Calc EF: 40 %
S' Lateral: 3.4 cm
Single Plane A2C EF: 40.4 %
Single Plane A4C EF: 37.1 %

## 2020-08-19 NOTE — Progress Notes (Signed)
Complete echocardiogram performed.  Jimmy Adrianna Dudas RDCS, RVT  

## 2020-08-20 ENCOUNTER — Telehealth: Payer: Self-pay | Admitting: Cardiology

## 2020-08-20 NOTE — Telephone Encounter (Signed)
Patient would like to do a Transfer of Care from Dr. Sullivan Lone to Dr. Bettina Gavia   Please confirm by both doctors

## 2020-08-20 NOTE — Telephone Encounter (Signed)
Fine with me

## 2020-08-27 ENCOUNTER — Telehealth: Payer: Self-pay | Admitting: Cardiology

## 2020-08-27 NOTE — Telephone Encounter (Signed)
Patient calling for echo results 

## 2020-08-27 NOTE — Telephone Encounter (Signed)
Patient also wants to know when she was diagnosed with afib

## 2020-08-27 NOTE — Telephone Encounter (Signed)
Patient also informed of upcoming appointment. No further questions.

## 2020-08-27 NOTE — Telephone Encounter (Signed)
Routing to Dr. Bettina Gavia because it looks like he was not included in this provider switch request.

## 2020-08-27 NOTE — Telephone Encounter (Signed)
Called patient. Informed her of echo results and told her the farthest back I can see a diagnosis for atrial fibrillation was in 2017.

## 2020-08-28 NOTE — Telephone Encounter (Signed)
08/28/20 1:19pm called to schedule appt with Dr. Bettina Gavia - OK from both Dr. Agustin Cree and Dr. Bettina Gavia to switch providers.

## 2020-08-28 NOTE — Telephone Encounter (Signed)
OK I tried to reply to [place] but no contact?

## 2020-09-12 ENCOUNTER — Ambulatory Visit (INDEPENDENT_AMBULATORY_CARE_PROVIDER_SITE_OTHER): Payer: Medicare Other | Admitting: Cardiology

## 2020-09-12 ENCOUNTER — Other Ambulatory Visit: Payer: Self-pay

## 2020-09-12 ENCOUNTER — Encounter: Payer: Self-pay | Admitting: Cardiology

## 2020-09-12 VITALS — BP 138/90 | HR 76 | Ht 66.0 in | Wt 187.0 lb

## 2020-09-12 DIAGNOSIS — Z7901 Long term (current) use of anticoagulants: Secondary | ICD-10-CM

## 2020-09-12 DIAGNOSIS — I480 Paroxysmal atrial fibrillation: Secondary | ICD-10-CM

## 2020-09-12 DIAGNOSIS — I119 Hypertensive heart disease without heart failure: Secondary | ICD-10-CM | POA: Insufficient documentation

## 2020-09-12 HISTORY — DX: Hypertensive heart disease without heart failure: I11.9

## 2020-09-12 MED ORDER — LOSARTAN POTASSIUM-HCTZ 100-12.5 MG PO TABS: 1.0000 | ORAL_TABLET | Freq: Every day | ORAL | 1 refills | Status: DC

## 2020-09-12 MED ORDER — ENTRESTO 24-26 MG PO TABS: 1.0000 | ORAL_TABLET | Freq: Two times a day (BID) | ORAL | 2 refills | Status: DC

## 2020-09-12 MED ORDER — ASPIRIN EC 81 MG PO TBEC: 81.0000 mg | DELAYED_RELEASE_TABLET | Freq: Every day | ORAL | 3 refills | Status: DC

## 2020-09-12 NOTE — Patient Instructions (Addendum)
Medication Instructions:  Your physician has recommended you make the following change in your medication:  START: Aspirin 81 mg daily     *If you need a refill on your cardiac medications before your next appointment, please call your pharmacy*   Lab Work: None.  If you have labs (blood work) drawn today and your tests are completely normal, you will receive your results only by: Marland Kitchen MyChart Message (if you have MyChart) OR . A paper copy in the mail If you have any lab test that is abnormal or we need to change your treatment, we will call you to review the results.   Testing/Procedures: A zio monitor was ordered today. It will remain on for 3 days. You will then return monitor and event diary in provided box. It takes 1-2 weeks for report to be downloaded and returned to [place]. We will call you with the results. If monitor falls off or has orange flashing light, please call [employer] for further instructions.      Follow-Up: At [facility], you and your health needs are our priority.  As part of our continuing mission to provide you with exceptional heart care, we have created designated Provider Care Teams.  These Care Teams include your primary Cardiologist (physician) and Advanced Practice Providers (APPs -  Physician Assistants and Nurse Practitioners) who all work together to provide you with the care you need, when you need it.  We recommend signing up for the patient portal called "MyChart".  Sign up information is provided on this After Visit Summary.  MyChart is used to connect with patients for Virtual Visits (Telemedicine).  Patients are able to view lab/test results, encounter notes, upcoming appointments, etc.  Non-urgent messages can be sent to your provider as well.   To learn more about what you can do with MyChart, go to [email or web address].    Your next appointment:   6 week(s)  The format for your next appointment:   In Person  Provider:   Shirlee More,  MD   Other Instructions

## 2020-09-12 NOTE — Progress Notes (Signed)
Cardiology Office Note:    Date:  09/12/2020   ID:  Briana Barrett, DOB 1946-10-18, MRN [id number]  PCP:  [name], MD  Cardiologist:  Shirlee More, MD    Referring MD: [name], MD    ASSESSMENT:    1. PAF (paroxysmal atrial fibrillation) ([place])   2. Chronic anticoagulation   3. Hypertensive heart disease without heart failure    PLAN:    In order of problems listed above:  1. I think she is better described as persistent atrial fibrillation again applied 3-day ZIO monitor look at heart rate response at this time I would not advise trying to resume sinus rhythm she will continue low-dose aspirin will not and cannot afford Eliquis and after she has her knee surgery will place her on warfarin managed to her anticoagulant clinic. It is a reasonable option. 2. Stable BP at target diuretic 3. Stable dyslipidemia continue statin 4. Preoperative, the planned surgery arthroscopic knee is low risk she is stable and thinks that he needs to be delayed and in fact had encouraged the surgery and when she is recovered will transition from aspirin to warfarin. 5. With mildly reduced ejection fraction will transition from ARB to [facility] 2426 and uptitrate and follow-up   Next appointment: 6 weeks   Medication Adjustments/Labs and Tests Ordered: Current medicines are reviewed at length with the patient today.  Concerns regarding medicines are outlined above.  No orders of the defined types were placed in this encounter.  No orders of the defined types were placed in this encounter.   Chief Complaint  Patient presents with  . Pre-op Exam    Surgery on Left knee    History of Present Illness:    Briana Barrett is a 74 y.o. female with a hx of paroxysmal atrial fibrillation usually seen by Dr. Agustin Cree in the office last seen 07/23/2020.  Recently she reinitiated anticoagulant therapy with Eliquis and other problems include artery calcification on CT scan with  a normal myocardial perfusion test, hypertensive heart disease and dyslipidemia.  When last seen she was in atrial fibrillation with a controlled ventricular rate.  Previous EKG was about 2 years before 10/21/2018 in sinus rhythm. Compliance with diet, lifestyle and medications: Yes  She is not specifically aware of atrial fibrillation and we do not know the duration. In general she is struggling now after the death of her husband she feels a little weaker on the lightheaded nothing specific no palpitation chest pain shortness of breath or edema. She is very worried about stroke risk cannot afford Eliquis we decided warfarin is a better match for Briana Barrett a delay as she plans to have arthroscopic low risk knee surgery. I asked her to wear a 3-day ZIO monitor just to assure [place] that she has good heart rate control with her atrial fibrillation we will use low-dose aspirin and after her knee surgery will place her on warfarin I told her following that we can make a decision whether we would pursue cardioversion although I think the likelihood of resuming maintaining sinus rhythm in this situation is low. She is comfortable with this approach.  Cardiogram 08/19/2020 shows EF 40 to 45% mildly decreased ejection fraction very left atrial enlargement. Past Medical History:  Diagnosis Date  . Age-related osteoporosis without current pathological fracture 01/21/2016   Last Assessment & Plan:  Relevant Hx: Course: Daily Update: Today's Plan:she feels this is stable for her   Electronically signed by: [name], NP  05/11/16 1430  . Allergic rhinitis 01/21/2016   Last Assessment & Plan:  Relevant Hx: Course: Daily Update: Today's Plan:this is stable for her  Electronically signed by: [name], NP 05/11/16 1427  . Anemia, iron deficiency 01/21/2016   Last Assessment & Plan:  Her last iron level was 68 and she is taking the iron daily see her CBC  . Anxiety 04/13/2016   Last Assessment &  Plan:  She is taking the xanax more daily and not her zoloft and she thinks it helps her more  . Atrial fibrillation ([place])   . Bradycardia 07/02/2017  . Calcification of lung 01/21/2016  . Coronary artery calcification seen on CT scan 01/21/2016  . DDD (degenerative disc disease), lumbosacral 01/21/2016   Last Assessment & Plan:  Relevant Hx: Course: Daily Update: Today's Plan:she is working again at third shift at the [employer] and she is on her feet and that is making this worse for her  Electronically signed by: [name], NP 05/11/16 1429  . Diabetes mellitus type 2, controlled ([place]) 01/21/2016   Last Assessment & Plan:  She does not check her sugar as her last average was good for her   . Dyslipidemia 07/23/2016  . Encounter for long-term (current) use of high-risk medication 01/21/2016  . Episodic lightheadedness 07/02/2017  . Gastroesophageal reflux disease without esophagitis 01/21/2016  . Hiatal hernia 01/21/2016   Last Assessment & Plan:  Relevant Hx: Course: Daily Update: Today's Plan:this is stable for her with the GERD  Electronically signed by: [name], NP 05/11/16 1428  . History of compression fracture of spine 01/21/2016  . Hypercholesteremia   . Hypertension   . Hypertension, essential 08/27/2015   Last Assessment & Plan:  Her BP readings that she brings in here are up and down, she has a pending appt with cardiology to FU on this, and she has an extreme amount of stress at home as well that is contributing to this . She and I talked about her BP meds and she is taking a low dose of the clonidine but at this time she wants to monitor this, she is aware of how to take the losartan and is taki  . Impaired functional mobility, balance, gait, and endurance 10/19/2018  . Localized edema 01/21/2016  . Malaise and fatigue 01/21/2016   Last Assessment & Plan:  I really feel her S/S are tied to her BP and the heart rate and it not ideally being controlled for  her with her inability to take multiple meds due to her S/e, she is frustrated with this and she stopped her amiodarone last Thursday, and she did not have any episodes since august 4-5, but is taking coreg as directed  . Malignant neoplasm of right breast ([place]) 01/21/2016  . Mixed hyperlipidemia 01/21/2016   Last Assessment & Plan:  Relevant Hx: Course: Daily Update: Today's Plan:update this for her fasting  Electronically signed by: [name], NP 05/11/16 1432  . Moderate recurrent major depression ([place]) 01/21/2016   Last Assessment & Plan:  Relevant Hx: Course: Daily Update: Today's Plan:this has been stable for her  Electronically signed by: [name], NP 05/11/16 1432  . 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[place]) 09/13/2015  . 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 Osteoarthritis, generalized 01/21/2016   Last Assessment & Plan:  Relevant Hx: Course: Daily Update: Today's Plan:this is stable for her at this time  Electronically  signed by: [name], NP 05/11/16 1430  . Ovarian cyst, right 01/21/2016  . Paroxysmal atrial flutter ([place]) 07/23/2016   Overview:  Chads score equals 1, prefers aspirin only Overview:  Overview:  Chads score equals 1, prefers aspirin only  . Plantar fasciitis   . Primary insomnia 04/13/2016   Last Assessment & Plan:  Relevant Hx: Course: Daily Update: Today's Plan:this has been more difficult with her working her third shift  Electronically signed by: [name], NP 05/11/16 1433  . Renal cyst, right 01/21/2016  . Swelling 07/02/2017  . 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 ANKLE SURGERY    . BREAST SURGERY    . CATARACT EXTRACTION    . CHOLECYSTECTOMY    . FOOT SURGERY     right  . SHOULDER SURGERY    . SKIN SURGERY  07/2020   nose  . TUBAL LIGATION      Current Medications: Current Meds  Medication Sig  .  ALPRAZolam (XANAX) 0.5 MG tablet Take 1 tablet by mouth twice a day as needed.  Refills after prescription expiration require follow-up evaluation at the office.  Marland Kitchen atorvastatin (LIPITOR) 40 MG tablet TAKE 1 TABLET BY MOUTH ONCE (1) DAILY  . furosemide (LASIX) 20 MG tablet Take 20 mg by mouth daily.  . hydrALAZINE (APRESOLINE) 25 MG tablet Take 25 mg by mouth 3 (three) times daily.   Marland Kitchen losartan-hydrochlorothiazide (HYZAAR) 100-12.5 MG tablet Take 1 tablet by mouth once daily  . omeprazole (PRILOSEC) 40 MG capsule Take 40 mg by mouth daily.     Allergies:   Amlodipine besylate, Buprenorphine hcl, Morphine and related, and Morphine and related   Social History   Socioeconomic History  . Marital status: Married    Spouse name: Not on file  . Number of children: Not on file  . Years of education: Not on file  . Highest education level: Not on file  Occupational History  . Not on file  Tobacco Use  . Smoking status: Never Smoker  . Smokeless tobacco: Never Used  Vaping Use  . Vaping Use: Never used  Substance and Sexual Activity  . Alcohol use: No  . Drug use: No  . Sexual activity: Not on file  Other Topics Concern  . Not on file  Social History Narrative   ** Merged History Encounter **       Social Determinants of Health   Financial Resource Strain:   . Difficulty of Paying Living Expenses: Not on file  Food Insecurity:   . Worried About Charity fundraiser in the Last Year: Not on file  . Ran Out of Food in the Last Year: Not on file  Transportation Needs:   . Lack of Transportation (Medical): Not on file  . Lack of Transportation (Non-Medical): Not on file  Physical Activity:   . Days of Exercise per Week: Not on file  . Minutes of Exercise per Session: Not on file  Stress:   . Feeling of Stress : Not on file  Social Connections:   . Frequency of Communication with Friends and Family: Not on file  . Frequency of Social Gatherings with Friends and Family: Not on  file  . Attends Religious Services: Not on file  . Active Member of Clubs or Organizations: Not on file  . Attends Archivist Meetings: Not on file  . Marital Status: Not on file  Family History: The patient's family history includes COPD in her father; Cancer in her mother; Heart failure in her sister; Hypertension in her father and mother. ROS:   Please see the history of present illness.    All other systems reviewed and are negative.  EKGs/Labs/Other Studies Reviewed:    The following studies were reviewed today:  Recent Labs: 07/23/2020: BUN 16; Creatinine, Ser 0.99; Hemoglobin 15.4; Platelets 259; Potassium 4.2; Sodium 143  Recent Lipid Panel   Physical Exam:    VS:  BP 138/90 (BP Location: Left Arm, Patient Position: Sitting, Cuff Size: Large)   Pulse 76   Ht 5\' 6"  (1.676 m)   Wt 187 lb (84.8 kg)   SpO2 96%   BMI 30.18 kg/m     Wt Readings from Last 3 Encounters:  09/12/20 187 lb (84.8 kg)  07/23/20 183 lb (83 kg)  04/06/19 180 lb (81.6 kg)     GEN:  Well nourished, well developed in no acute distress HEENT: Normal NECK: No JVD; No carotid bruits LYMPHATICS: No lymphadenopathy CARDIAC: Obvious atrial fibrillation irregular heart rhythm RRR, no murmurs, rubs, gallops RESPIRATORY:  Clear to auscultation without rales, wheezing or rhonchi  ABDOMEN: Soft, non-tender, non-distended MUSCULOSKELETAL:  No edema; No deformity  SKIN: Warm and dry NEUROLOGIC:  Alert and oriented x 3 PSYCHIATRIC:  Normal affect    Signed, Shirlee More, MD  09/12/2020 11:43 AM    [place]

## 2020-09-18 ENCOUNTER — Telehealth: Payer: Self-pay | Admitting: Cardiology

## 2020-09-18 ENCOUNTER — Ambulatory Visit (INDEPENDENT_AMBULATORY_CARE_PROVIDER_SITE_OTHER): Payer: Medicare Other

## 2020-09-18 DIAGNOSIS — I480 Paroxysmal atrial fibrillation: Secondary | ICD-10-CM

## 2020-09-18 NOTE — Telephone Encounter (Signed)
Called patient. Informed her that yes she needs monitor it should be arriving this week she will call back if not delivered this week. No further questions.

## 2020-09-18 NOTE — Telephone Encounter (Signed)
New message:    Patient calling to see if she suppose to be  Wearing a monitor, because the patient hasn't received it. Also patient states she have question concering her medications.

## 2020-09-23 ENCOUNTER — Ambulatory Visit: Payer: Medicare Other | Admitting: Cardiology

## 2020-09-27 ENCOUNTER — Telehealth: Payer: Self-pay | Admitting: Cardiology

## 2020-09-27 NOTE — Telephone Encounter (Signed)
Follow Up:    Briana Barrett is calling to check on the status of pt's clearance  From 06-20-20. She might be cleared, she need a paper stating that pt is cleared please. Please fax asap to [phone].

## 2020-09-27 NOTE — Telephone Encounter (Signed)
I will fax recent surgical clearance office visit note.

## 2020-10-02 DIAGNOSIS — I4821 Permanent atrial fibrillation: Secondary | ICD-10-CM | POA: Insufficient documentation

## 2020-10-02 HISTORY — DX: Permanent atrial fibrillation: I48.21

## 2020-10-09 ENCOUNTER — Telehealth: Payer: Self-pay

## 2020-10-09 NOTE — Telephone Encounter (Signed)
-----   Message from Richardo Priest, MD sent at 10/09/2020  8:38 AM EDT ----- Overall good result  She is in atrial fibrillation all the time with good heart rate control  There is not a high frequency of ventricular premature beats but she senses them with the triggered and diary events.  This is not dangerous.  I would advise no change in treatment.

## 2020-10-09 NOTE — Telephone Encounter (Signed)
Spoke with patient regarding results and recommendation.  Patient verbalizes understanding and is agreeable to plan of care. Advised patient to call back with any issues or concerns.  

## 2020-10-09 NOTE — Telephone Encounter (Signed)
Left message on patients voicemail to please return our call.   

## 2020-10-29 ENCOUNTER — Ambulatory Visit (INDEPENDENT_AMBULATORY_CARE_PROVIDER_SITE_OTHER): Payer: Medicare Other | Admitting: Cardiology

## 2020-10-29 ENCOUNTER — Encounter: Payer: Self-pay | Admitting: Cardiology

## 2020-10-29 ENCOUNTER — Other Ambulatory Visit: Payer: Self-pay

## 2020-10-29 VITALS — BP 125/87 | HR 86 | Ht 66.0 in | Wt 186.8 lb

## 2020-10-29 DIAGNOSIS — I119 Hypertensive heart disease without heart failure: Secondary | ICD-10-CM

## 2020-10-29 DIAGNOSIS — I4819 Other persistent atrial fibrillation: Secondary | ICD-10-CM | POA: Diagnosis not present

## 2020-10-29 DIAGNOSIS — R0602 Shortness of breath: Secondary | ICD-10-CM

## 2020-10-29 DIAGNOSIS — Z7901 Long term (current) use of anticoagulants: Secondary | ICD-10-CM | POA: Diagnosis not present

## 2020-10-29 DIAGNOSIS — M25569 Pain in unspecified knee: Secondary | ICD-10-CM | POA: Diagnosis not present

## 2020-10-29 MED ORDER — POTASSIUM CHLORIDE ER 20 MEQ PO TBCR: 20.0000 meq | EXTENDED_RELEASE_TABLET | Freq: Two times a day (BID) | ORAL | 3 refills | Status: DC

## 2020-10-29 MED ORDER — FUROSEMIDE 20 MG PO TABS: ORAL_TABLET | ORAL | 3 refills | Status: DC

## 2020-10-29 NOTE — Patient Instructions (Signed)
Medication Instructions:  Your physician has recommended you make the following change in your medication:  INCREASE: Furosemide 20 mg take two tablets by mouth daily in the morning and one tablet by mouth daily in the evening.  INCREASE: Potassium chloride 20 meq take one tablet by mouth twice daily.  *If you need a refill on your cardiac medications before your next appointment, please call your pharmacy*   Lab Work: Your physician recommends that you return for lab work in: TODAY BMP, ProBNP If you have labs (blood work) drawn today and your tests are completely normal, you will receive your results only by: Marland Kitchen MyChart Message (if you have MyChart) OR . A paper copy in the mail If you have any lab test that is abnormal or we need to change your treatment, we will call you to review the results.   Testing/Procedures: None   Follow-Up: At [facility], you and your health needs are our priority.  As part of our continuing mission to provide you with exceptional heart care, we have created designated Provider Care Teams.  These Care Teams include your primary Cardiologist (physician) and Advanced Practice Providers (APPs -  Physician Assistants and Nurse Practitioners) who all work together to provide you with the care you need, when you need it.  We recommend signing up for the patient portal called "MyChart".  Sign up information is provided on this After Visit Summary.  MyChart is used to connect with patients for Virtual Visits (Telemedicine).  Patients are able to view lab/test results, encounter notes, upcoming appointments, etc.  Non-urgent messages can be sent to your provider as well.   To learn more about what you can do with MyChart, go to [email or web address].    Your next appointment:   3 month(s)  The format for your next appointment:   In Person  Provider:   Shirlee More, MD   Other Instructions

## 2020-10-29 NOTE — Addendum Note (Signed)
Addended by: Resa Miner I on: 10/29/2020 01:30 PM   Modules accepted: Orders

## 2020-10-29 NOTE — Addendum Note (Signed)
Addended by: Resa Miner I on: 10/29/2020 01:35 PM   Modules accepted: Orders

## 2020-10-29 NOTE — Progress Notes (Signed)
Cardiology Office Note:    Date:  10/29/2020   ID:  Briana Barrett, DOB [birth year], MRN [id number]  PCP:  [name], MD  Cardiologist:  Shirlee More, MD    Referring MD: [name], MD    ASSESSMENT:    1. Persistent atrial fibrillation ([place])   2. Chronic anticoagulation   3. Hypertensive heart disease without heart failure    PLAN:    In order of problems listed above:  1. Stable rate is controlled on aspirin and expresses a willingness to take direct anticoagulant when her Medicare D changes January 1 2. Blood pressure controlled she is fluid overloaded symptomatic check BMP proBNP and increase the dose of her furosemide and continue current antihypertensives hydralazine ARB diuretic.  I have tried to transition to [employer] but finances are a barrier and will readdress at the next visit 3. At her request I will refer back to Dr. Joie Bimler she would like to have her total knee arthroplasty performed   Next appointment: 3 months   Medication Adjustments/Labs and Tests Ordered: Current medicines are reviewed at length with the patient today.  Concerns regarding medicines are outlined above.  No orders of the defined types were placed in this encounter.  No orders of the defined types were placed in this encounter.   Chief Complaint  Patient presents with  . Follow-up  . Atrial Fibrillation  . Anticoagulation    History of Present Illness:    Briana Barrett is a 74 y.o. female with a hx of paroxysmal atrial fibrillation on Eliquis, coronary artery calcification on CT scan with a normal myocardial perfusion test hypertensive heart disease and dyslipidemia.  She was last seen 09/12/2020 with persistent atrial fibrillation.  With financial difficulties she was referred to the warfarin clinic for transition.  Echocardiogram performed August 2021 shows an EF of 40 to 45%. Compliance with diet, lifestyle and medications: Yes  A ZIO monitor initiated  09/18/2020 shows atrial fibrillation throughout the recording with good heart rate control daytime 97% nighttime 100% between 50 and 110 bpm.  She had canceled her total knee arthroplasty because he had a couple of evenings and being short of breath at nighttime and is variable degrees of edema.  She has been taking somewhere between 30 to 40 mg of furosemide daily most recently 40 mg in the morning.  She expressed a willingness to take Eliquis after January 1 when her Medicare D changes.  No chest pain no shortness of breath during the day palpitation or syncope.  More recent labs 10/02/2020: Potassium 3.4 creatinine 0.92 GFR 61 cc normal liver function test hemoglobin normal 14.6 Past Medical History:  Diagnosis Date  . Age-related osteoporosis without current pathological fracture 01/21/2016   Last Assessment & Plan:  Relevant Hx: Course: Daily Update: Today's Plan:she feels this is stable for her   Electronically signed by: [name], NP 05/11/16 1430  . Allergic rhinitis 01/21/2016   Last Assessment & Plan:  Relevant Hx: Course: Daily Update: Today's Plan:this is stable for her  Electronically signed by: [name], NP 05/11/16 1427  . Anemia, iron deficiency 01/21/2016   Last Assessment & Plan:  Her last iron level was 68 and she is taking the iron daily see her CBC  . Anxiety 04/13/2016   Last Assessment & Plan:  She is taking the xanax more daily and not her zoloft and she thinks it helps her more  . Atrial fibrillation ([place])   . Bradycardia  07/02/2017  . Calcification of lung 01/21/2016  . Coronary artery calcification seen on CT scan 01/21/2016  . DDD (degenerative disc disease), lumbosacral 01/21/2016   Last Assessment & Plan:  Relevant Hx: Course: Daily Update: Today's Plan:she is working again at third shift at the [employer] and she is on her feet and that is making this worse for her  Electronically signed by: [name], NP 05/11/16  1429  . Diabetes mellitus type 2, controlled ([place]) 01/21/2016   Last Assessment & Plan:  She does not check her sugar as her last average was good for her   . Dyslipidemia 07/23/2016  . Encounter for long-term (current) use of high-risk medication 01/21/2016  . Episodic lightheadedness 07/02/2017  . Gastroesophageal reflux disease without esophagitis 01/21/2016  . Hiatal hernia 01/21/2016   Last Assessment & Plan:  Relevant Hx: Course: Daily Update: Today's Plan:this is stable for her with the GERD  Electronically signed by: [name], NP 05/11/16 1428  . History of compression fracture of spine 01/21/2016  . Hypercholesteremia   . Hypertension   . Hypertension, essential 08/27/2015   Last Assessment & Plan:  Her BP readings that she brings in here are up and down, she has a pending appt with cardiology to FU on this, and she has an extreme amount of stress at home as well that is contributing to this . She and I talked about her BP meds and she is taking a low dose of the clonidine but at this time she wants to monitor this, she is aware of how to take the losartan and is taki  . Impaired functional mobility, balance, gait, and endurance 10/19/2018  . Localized edema 01/21/2016  . Malaise and fatigue 01/21/2016   Last Assessment & Plan:  I really feel her S/S are tied to her BP and the heart rate and it not ideally being controlled for her with her inability to take multiple meds due to her S/e, she is frustrated with this and she stopped her amiodarone last Thursday, and she did not have any episodes since august 4-5, but is taking coreg as directed  . Malignant neoplasm of right breast ([place]) 01/21/2016  . Mixed hyperlipidemia 01/21/2016   Last Assessment & Plan:  Relevant Hx: Course: Daily Update: Today's Plan:update this for her fasting  Electronically signed by: [name], NP 05/11/16 1432  . Moderate recurrent major depression ([place]) 01/21/2016   Last Assessment & Plan:   Relevant Hx: Course: Daily Update: Today's Plan:this has been stable for her  Electronically signed by: [name], NP 05/11/16 1432  . 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[place]) 09/13/2015  . 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 Osteoarthritis, generalized 01/21/2016   Last Assessment & Plan:  Relevant Hx: Course: Daily Update: Today's Plan:this is stable for her at this time  Electronically signed by: [name], NP 05/11/16 1430  . Ovarian cyst, right 01/21/2016  . Paroxysmal atrial flutter ([place]) 07/23/2016   Overview:  Chads score equals 1, prefers aspirin only Overview:  Overview:  Chads score equals 1, prefers aspirin only  . Plantar fasciitis   . Primary insomnia 04/13/2016   Last Assessment & Plan:  Relevant Hx: Course: Daily Update: Today's Plan:this has been more difficult with her working her third shift  Electronically signed by: [name], NP 05/11/16 1433  . Renal cyst, right 01/21/2016  . Swelling 07/02/2017  . Thoracic degenerative disc  disease 06/16/2018    Past Surgical History:  Procedure Laterality Date  . ANKLE SURGERY    . BREAST SURGERY    . CATARACT EXTRACTION    . CHOLECYSTECTOMY    . FOOT SURGERY     right  . SHOULDER SURGERY    . SKIN SURGERY  07/2020   nose  . TUBAL LIGATION      Current Medications: Current Meds  Medication Sig  . ALPRAZolam (XANAX) 0.5 MG tablet Take 1 tablet by mouth twice a day as needed.  Refills after prescription expiration require follow-up evaluation at the office.  Marland Kitchen aspirin EC 81 MG tablet Take 1 tablet (81 mg total) by mouth daily. Swallow whole.  Marland Kitchen atorvastatin (LIPITOR) 40 MG tablet TAKE 1 TABLET BY MOUTH ONCE (1) DAILY  . furosemide (LASIX) 20 MG tablet Take 20 mg by mouth daily.  . hydrALAZINE (APRESOLINE) 25 MG tablet Take 25 mg by mouth 3 (three) times daily.   Marland Kitchen losartan-hydrochlorothiazide (HYZAAR) 100-12.5 MG tablet Take 1 tablet by mouth daily.    Marland Kitchen omeprazole (PRILOSEC) 40 MG capsule Take 40 mg by mouth daily.  . Potassium Chloride ER 20 MEQ TBCR Take 20 mEq by mouth daily.     Allergies:   Amlodipine besylate, Buprenorphine hcl, Morphine and related, and Morphine and related   Social History   Socioeconomic History  . Marital status: Married    Spouse name: Not on file  . Number of children: Not on file  . Years of education: Not on file  . Highest education level: Not on file  Occupational History  . Not on file  Tobacco Use  . Smoking status: Never Smoker  . Smokeless tobacco: Never Used  Vaping Use  . Vaping Use: Never used  Substance and Sexual Activity  . Alcohol use: No  . Drug use: No  . Sexual activity: Not on file  Other Topics Concern  . Not on file  Social History Narrative   ** Merged History Encounter **       Social Determinants of Health   Financial Resource Strain:   . Difficulty of Paying Living Expenses: Not on file  Food Insecurity:   . Worried About Charity fundraiser in the Last Year: Not on file  . Ran Out of Food in the Last Year: Not on file  Transportation Needs:   . Lack of Transportation (Medical): Not on file  . Lack of Transportation (Non-Medical): Not on file  Physical Activity:   . Days of Exercise per Week: Not on file  . Minutes of Exercise per Session: Not on file  Stress:   . Feeling of Stress : Not on file  Social Connections:   . Frequency of Communication with Friends and Family: Not on file  . Frequency of Social Gatherings with Friends and Family: Not on file  . Attends Religious Services: Not on file  . Active Member of Clubs or Organizations: Not on file  . Attends Archivist Meetings: Not on file  . Marital Status: Not on file     Family History: The patient's family history includes COPD in her father; Cancer in her mother; Heart failure in her sister; Hypertension in her father and mother. ROS:   Please see the history of present illness.     All other systems reviewed and are negative.  EKGs/Labs/Other Studies Reviewed:    The following studies were reviewed today:   Recent Labs: 07/23/2020: BUN 16; Creatinine, Ser 0.99;  Hemoglobin 15.4; Platelets 259; Potassium 4.2; Sodium 143  Recent Lipid Panel No results found for: CHOL, TRIG, HDL, CHOLHDL, VLDL, LDLCALC, LDLDIRECT  Physical Exam:    VS:  BP 125/87   Pulse 86   Ht 5\' 6"  (1.676 m)   Wt 186 lb 12.8 Barrett (84.7 kg)   SpO2 97%   BMI 30.15 kg/m     Wt Readings from Last 3 Encounters:  10/29/20 186 lb 12.8 Barrett (84.7 kg)  09/12/20 187 lb (84.8 kg)  07/23/20 183 lb (83 kg)     GEN:  Well nourished, well developed in no acute distress HEENT: Normal NECK: No JVD; No carotid bruits LYMPHATICS: No lymphadenopathy CARDIAC: Irregular rhythm S1 variable RRR, no murmurs, rubs, gallops RESPIRATORY:  Clear to auscultation without rales, wheezing or rhonchi  ABDOMEN: Soft, non-tender, non-distended MUSCULOSKELETAL: She has 1-2+ bilateral pitting edema; No deformity  SKIN: Warm and dry NEUROLOGIC:  Alert and oriented x 3 PSYCHIATRIC:  Normal affect    Signed, Shirlee More, MD  10/29/2020 1:11 PM    [facility]

## 2020-10-30 LAB — BASIC METABOLIC PANEL
BUN/Creatinine Ratio: 16 (ref 12–28)
BUN: 14 mg/dL (ref 8–27)
CO2: 26 mmol/L (ref 20–29)
Calcium: 9 mg/dL (ref 8.7–10.3)
Chloride: 101 mmol/L (ref 96–106)
Creatinine, Ser: 0.89 mg/dL (ref 0.57–1.00)
GFR calc Af Amer: 73 mL/min/{1.73_m2} (ref >59)
GFR calc non Af Amer: 63 mL/min/{1.73_m2} (ref >59)
Glucose: 126 mg/dL — ABNORMAL HIGH (ref 65–99)
Potassium: 3.7 mmol/L (ref 3.5–5.2)
Sodium: 141 mmol/L (ref 134–144)

## 2020-10-30 LAB — PRO B NATRIURETIC PEPTIDE: NT-Pro BNP: 898 pg/mL — ABNORMAL HIGH (ref 0–738)

## 2020-10-30 NOTE — Progress Notes (Signed)
I was able to speak with the patient and let her know her labs are stable. She will continue the increased diuretic as discussed during her visit. All questions answered.

## 2020-11-22 ENCOUNTER — Telehealth: Payer: Self-pay | Admitting: Oncology

## 2020-11-22 NOTE — Telephone Encounter (Signed)
Patient called to Reschedule 12/21 Labs, Follow Up to an earlier time.  This is due to her Knee Surgery scheduled for 12/16. Patient Rescheduled to 12/7

## 2020-11-25 ENCOUNTER — Other Ambulatory Visit: Payer: Self-pay | Admitting: Oncology

## 2020-11-25 DIAGNOSIS — D509 Iron deficiency anemia, unspecified: Secondary | ICD-10-CM

## 2020-11-25 NOTE — Progress Notes (Signed)
[facility]  [address] [place],  [place]  [address] [phone]  Clinic Day:  11/26/2020  Referring physician: [name], MD   HISTORY OF PRESENT ILLNESS:  The patient is a 74 y.o. female with stage IA (T1a N0 M0) hormone positive breast cancer, status post a lumpectomy in November 2013.  She also has a history of iron deficiency anemia, for which IV and oral iron were effective in replenishing her iron stores.  She comes in today for her annual follow-up.  Since her last visit, the patient has been doing well.  She denies having any changes in her breasts which concern her for disease recurrence.  From an iron deficiency anemia standpoint, she denies having increased fatigue or any overt forms of blood loss.  Of note, her mammogram in October 2021 showed calcifications for which short-term follow-up was recommended.      PHYSICAL EXAM:  Blood pressure (!) 157/88, pulse 87, temperature 98.1 F (36.7 C), resp. rate 14, height 5\' 7"  (1.702 m), weight 185 lb (83.9 kg), SpO2 97 %. Wt Readings from Last 3 Encounters:  11/26/20 185 lb (83.9 kg)  10/29/20 186 lb 12.8 oz (84.7 kg)  09/12/20 187 lb (84.8 kg)   Body mass index is 28.98 kg/m. Performance status (ECOG): 0 - Asymptomatic Physical Exam Constitutional:      Appearance: Normal appearance. She is not ill-appearing.  HENT:     Mouth/Throat:     Mouth: Mucous membranes are moist.     Pharynx: Oropharynx is clear. No oropharyngeal exudate or posterior oropharyngeal erythema.  Cardiovascular:     Rate and Rhythm: Normal rate and regular rhythm.     Heart sounds: No murmur heard.  No friction rub. No gallop.   Pulmonary:     Effort: Pulmonary effort is normal. No respiratory distress.     Breath sounds: Normal breath sounds. No wheezing, rhonchi or rales.  Abdominal:     General: Bowel sounds are normal. There is no distension.     Palpations: Abdomen is soft. There is no mass.      Tenderness: There is no abdominal tenderness.  Musculoskeletal:        General: No swelling.     Right lower leg: No edema.     Left lower leg: No edema.  Lymphadenopathy:     Cervical: No cervical adenopathy.     Upper Body:     Right upper body: No supraclavicular or axillary adenopathy.     Left upper body: No supraclavicular or axillary adenopathy.     Lower Body: No right inguinal adenopathy. No left inguinal adenopathy.  Skin:    General: Skin is warm.     Coloration: Skin is not jaundiced.     Findings: No lesion or rash.  Neurological:     General: No focal deficit present.     Mental Status: She is alert and oriented to person, place, and time. Mental status is at baseline.     Cranial Nerves: Cranial nerves are intact.  Psychiatric:        Mood and Affect: Mood normal.        Behavior: Behavior normal.        Thought Content: Thought content normal.     LABS:   CBC Latest Ref Rng & Units 11/26/2020 07/23/2020 10/26/2018  WBC - 13.2 7.6 5.4  Hemoglobin 12.0 - 16.0 14.7 15.4 13.0  Hematocrit 36 - 46 45 46.9(H) 40.0  Platelets 150 -  399 239 259 225   CMP Latest Ref Rng & Units 10/29/2020 07/23/2020 11/15/2018  Glucose 65 - 99 mg/dL 126(H) 94 129(H)  BUN 8 - 27 mg/dL 14 16 19   Creatinine 0.57 - 1.00 mg/dL 0.89 0.99 1.02(H)  Sodium 134 - 144 mmol/L 141 143 143  Potassium 3.5 - 5.2 mmol/L 3.7 4.2 3.6  Chloride 96 - 106 mmol/L 101 104 104  CO2 20 - 29 mmol/L 26 26 24   Calcium 8.7 - 10.3 mg/dL 9.0 9.0 8.9    Lab Results  Component Value Date   TIBC 359 11/26/2020   FERRITIN 54.8 11/26/2020   IRONPCTSAT 21.7 11/26/2020     Ref. Range 11/26/2020 00:00  Iron Unknown 78  TIBC Unknown 359  %SAT Unknown 21.7  Ferritin Unknown 54.8    ASSESSMENT & PLAN:   Assessment/Plan:  A 74 y.o. female with stage IA (T1a N0 M0) hormone positive breast cancer, status post a lumpectomy in November 2013.  Based upon her clinical breast exam today, she appears to be disease free.  She  is already scheduled for a repeat mammogram in 6 months for her continued disease surveillance.  From a hematologic standpoint, her hemoglobin and iron levels remain fine.  I will see her back in 1 year for repeat clinical assessment.  The patient understands all the plans discussed today and is in agreement with them.    Apryle Stowell Macarthur Critchley, MD

## 2020-11-26 ENCOUNTER — Encounter: Payer: Self-pay | Admitting: Oncology

## 2020-11-26 ENCOUNTER — Other Ambulatory Visit: Payer: Self-pay

## 2020-11-26 ENCOUNTER — Other Ambulatory Visit: Payer: Self-pay | Admitting: Hematology and Oncology

## 2020-11-26 ENCOUNTER — Inpatient Hospital Stay (INDEPENDENT_AMBULATORY_CARE_PROVIDER_SITE_OTHER): Payer: Medicare Other | Admitting: Oncology

## 2020-11-26 ENCOUNTER — Inpatient Hospital Stay: Payer: Medicare Other | Attending: Oncology

## 2020-11-26 ENCOUNTER — Other Ambulatory Visit: Payer: Self-pay | Admitting: Oncology

## 2020-11-26 VITALS — BP 157/88 | HR 87 | Temp 98.1°F | Resp 14 | Ht 67.0 in | Wt 185.0 lb

## 2020-11-26 DIAGNOSIS — I2510 Atherosclerotic heart disease of native coronary artery without angina pectoris: Secondary | ICD-10-CM | POA: Diagnosis not present

## 2020-11-26 DIAGNOSIS — D509 Iron deficiency anemia, unspecified: Secondary | ICD-10-CM

## 2020-11-26 LAB — CBC AND DIFFERENTIAL
HCT: 45 (ref 36–46)
Hemoglobin: 14.7 (ref 12.0–16.0)
Neutrophils Absolute: 9.64
Platelets: 239 (ref 150–399)
WBC: 13.2

## 2020-11-26 LAB — IRON,TIBC AND FERRITIN PANEL
%SAT: 21.7
Ferritin: 54.8
Iron: 78
TIBC: 359

## 2020-11-26 LAB — CBC
MCV: 84 (ref 76–111)
RBC: 5.34 — AB (ref 3.87–5.11)

## 2020-12-10 ENCOUNTER — Ambulatory Visit: Payer: Medicare Other | Admitting: Oncology

## 2020-12-10 ENCOUNTER — Other Ambulatory Visit: Payer: Medicare Other

## 2020-12-11 ENCOUNTER — Telehealth: Payer: Self-pay | Admitting: Cardiology

## 2020-12-11 NOTE — Telephone Encounter (Signed)
Pt's appointment moved to 01/08/20 at 3:00.

## 2020-12-11 NOTE — Telephone Encounter (Signed)
Patient said she went to her Lung doctor yesterday and was told that her issues are not lung related and may be because of her heart. She was told by her Lung Doctor to see her Cardiologist asap.

## 2020-12-24 ENCOUNTER — Telehealth: Payer: Self-pay | Admitting: Cardiology

## 2020-12-24 NOTE — Telephone Encounter (Signed)
Pt c/o Shortness Of Breath: STAT if SOB developed within the last 24 hours or pt is noticeably SOB on the phone  1. Are you currently SOB (can you hear that pt is SOB on the phone)? No.  2. How long have you been experiencing SOB? A month.  3. Are you SOB when sitting or when up moving around? Only with exertion.  4. Are you currently experiencing any other symptoms? Patient states she's been unable to sleep and has been weak.    Pt c/o BP issue: STAT if pt c/o blurred vision, one-sided weakness or slurred speech  1. What are your last 5 BP readings? 12/21/2020 152/96   2. Are you having any other symptoms (ex. Dizziness, headache, blurred vision, passed out)? Feeling weak and exhausted.  3. What is your BP issue? Patient is calling in stating she's been SOB and feeling weak, she states that she took her blood pressure on Saturday and it was elevated. Patient has an appointment 01/07/2021 but would like to be seen sooner. Please advise.

## 2020-12-24 NOTE — Telephone Encounter (Signed)
Spoke to the patient just now and she said she would like for her appointment to be moved up if there were to be a cancellation. I advised that I would keep her in mind if we did but that she should contact her PCP and try to be seen by him at this time. She verbalizes understanding and states that she will call him now.

## 2020-12-31 DIAGNOSIS — I10 Essential (primary) hypertension: Secondary | ICD-10-CM | POA: Insufficient documentation

## 2020-12-31 DIAGNOSIS — E7800 Pure hypercholesterolemia, unspecified: Secondary | ICD-10-CM | POA: Insufficient documentation

## 2020-12-31 DIAGNOSIS — I4891 Unspecified atrial fibrillation: Secondary | ICD-10-CM | POA: Insufficient documentation

## 2020-12-31 DIAGNOSIS — M722 Plantar fascial fibromatosis: Secondary | ICD-10-CM | POA: Insufficient documentation

## 2021-01-06 NOTE — Progress Notes (Signed)
Virtual Visit via Video Note   This visit type was conducted due to national recommendations for restrictions regarding the COVID-19 Pandemic (e.g. social distancing) in an effort to limit this patient's exposure and mitigate transmission in our community.  Due to her co-morbid illnesses, this patient is at least at moderate risk for complications without adequate follow up.  This format is felt to be most appropriate for this patient at this time.  All issues noted in this document were discussed and addressed.  A limited physical exam was performed with this format.  Please refer to the patient's chart for her consent to telehealth for [facility].       Date:  01/07/2021   ID:  Briana Barrett, DOB [birth year], MRN [id number] The patient was identified using 2 identifiers.  Patient Location: Home Provider Location: Office/Clinic  PCP:  [name], MD  Cardiologist: Dr. Bettina Gavia Electrophysiologist:  None   Evaluation Performed:  Follow-Up Visit  Chief Complaint: Shortness of breath  History of Present Illness:    Briana Barrett is a 75 y.o. female with paroxysmal atrial fibrillation on Eliquis, coronary artery calcification on CT scan with a normal myocardial perfusion test hypertensive heart disease and dyslipidemia last seen 10/29/2020.  At time heart failure was decompensated and she was fluid overload and her diuretic was increased.  She had barriers to healthcare financially with plans to transition to [facility] and a direct anticoagulant once able to be afforded.  Her Echocardiogram performed August 2021 showed an EF of 40 to 45% with pseudo normal diastolic function indicating elevated filling pressure.  Her recent proBNP level 10/29/2020 mildly elevated 898 below the cutoff for heart failure in her age group.  Chest x-ray 2019 showed chronic interstitial changes.  Her event monitor from 09/18/2020 3 days showed atrial fibrillation with good heart rate  control.  Recent labs from her PCP 10/02/2020: Potassium 3.4 GFR 61 cc creatinine 0.92 normal liver function test hemoglobin 14.6  The patient does not have symptoms concerning for COVID-19 infection (fever, chills, cough, or new shortness of breath).   Since her last visit edema has cleared but she is unimproved.  She has had a couple episodes of PND but has no edema and finds her self short of breath with any activity light housework kitchen work walking room to room and even ADLs.  No chest pain she has episodes of palpitation and when she does her recorded blood pressure is 136/83 with a pulse of 93.  With financial limitation she never started an anticoagulant and tells me she still does not want to take it because of cost but she like to transition from [facility] to [facility] and we decide if she is unimproved when I see her back in the office in a month we will plan anticoagulation and cardioversion with age-indeterminate atrial fibrillation.  She bitterly complains of lower extremity muscle weakness has trouble getting out of a chair is at risk for statin myopathy will discontinue atorvastatin.  Is on potassium supplement with mild hypokalemia and will need follow-up labs when seen in the office and a proBNP level. Past Medical History:  Diagnosis Date  . Age-related osteoporosis without current pathological fracture 01/21/2016   Last Assessment & Plan:  Relevant Hx: Course: Daily Update: Today's Plan:she feels this is stable for her   Electronically signed by: [name], NP 05/11/16 1430  . Allergic rhinitis 01/21/2016   Last Assessment & Plan:  Relevant Hx: Course: Daily Update: Today's  Plan:this is stable for her  Electronically signed by: [name], NP 05/11/16 1427  . Anemia, iron deficiency 01/21/2016   Last Assessment & Plan:  Her last iron level was 68 and she is taking the iron daily see her CBC  . Anxiety 04/13/2016   Last Assessment & Plan:  She is taking  the xanax more daily and not her zoloft and she thinks it helps her more  . Atrial fibrillation ([place])   . Bradycardia 07/02/2017  . Calcification of lung 01/21/2016  . Coronary artery calcification seen on CT scan 01/21/2016  . DDD (degenerative disc disease), lumbosacral 01/21/2016   Last Assessment & Plan:  Relevant Hx: Course: Daily Update: Today's Plan:she is working again at third shift at the [employer] and she is on her feet and that is making this worse for her  Electronically signed by: [name], NP 05/11/16 1429  . Diabetes mellitus type 2, controlled ([place]) 01/21/2016   Last Assessment & Plan:  She does not check her sugar as her last average was good for her   . Dyslipidemia 07/23/2016  . Encounter for long-term (current) use of high-risk medication 01/21/2016  . Episodic lightheadedness 07/02/2017  . Gastroesophageal reflux disease without esophagitis 01/21/2016  . Hiatal hernia 01/21/2016   Last Assessment & Plan:  Relevant Hx: Course: Daily Update: Today's Plan:this is stable for her with the GERD  Electronically signed by: [name], NP 05/11/16 1428  . History of compression fracture of spine 01/21/2016  . Hypercholesteremia   . Hypertension   . Hypertension, essential 08/27/2015   Last Assessment & Plan:  Her BP readings that she brings in here are up and down, she has a pending appt with cardiology to FU on this, and she has an extreme amount of stress at home as well that is contributing to this . She and I talked about her BP meds and she is taking a low dose of the clonidine but at this time she wants to monitor this, she is aware of how to take the losartan and is taki  . Impaired functional mobility, balance, gait, and endurance 10/19/2018  . Localized edema 01/21/2016  . Malaise and fatigue 01/21/2016   Last Assessment & Plan:  I really feel her S/S are tied to her BP and the heart rate and it not ideally being controlled for her with her inability  to take multiple meds due to her S/e, she is frustrated with this and she stopped her amiodarone last Thursday, and she did not have any episodes since august 4-5, but is taking coreg as directed  . Malignant neoplasm of right breast ([place]) 01/21/2016  . Mixed hyperlipidemia 01/21/2016   Last Assessment & Plan:  Relevant Hx: Course: Daily Update: Today's Plan:update this for her fasting  Electronically signed by: [name], NP 05/11/16 1432  . Moderate recurrent major depression ([place]) 01/21/2016   Last Assessment & Plan:  Relevant Hx: Course: Daily Update: Today's Plan:this has been stable for her  Electronically signed by: [name], NP 05/11/16 1432  . 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[place]) 09/13/2015  . 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 Osteoarthritis, generalized 01/21/2016   Last Assessment & Plan:  Relevant Hx: Course: Daily Update: Today's Plan:this is stable for her at this time  Electronically signed by: [name], NP 05/11/16 1430  . Ovarian cyst, right 01/21/2016  . Paroxysmal atrial flutter ([place])  07/23/2016   Overview:  Chads score equals 1, prefers aspirin only Overview:  Overview:  Chads score equals 1, prefers aspirin only  . Plantar fasciitis   . Primary insomnia 04/13/2016   Last Assessment & Plan:  Relevant Hx: Course: Daily Update: Today's Plan:this has been more difficult with her working her third shift  Electronically signed by: [name], NP 05/11/16 1433  . Renal cyst, right 01/21/2016  . Swelling 07/02/2017  . 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 ANKLE SURGERY    . BREAST SURGERY    . CATARACT EXTRACTION    . CHOLECYSTECTOMY    . FOOT SURGERY     right  . SHOULDER SURGERY    . SKIN SURGERY  07/2020   nose  . TUBAL LIGATION       Current Meds  Medication Sig  . ALPRAZolam (XANAX) 0.5 MG tablet Take 1  tablet by mouth twice a day as needed.  Refills after prescription expiration require follow-up evaluation at the office.  Marland Kitchen aspirin EC 81 MG tablet Take 1 tablet (81 mg total) by mouth daily. Swallow whole.  Marland Kitchen atorvastatin (LIPITOR) 40 MG tablet TAKE 1 TABLET BY MOUTH ONCE (1) DAILY  . furosemide (LASIX) 20 MG tablet Take two tablets (40 mg) total by mouth daily in the morning and one tablet (20 mg) total by mouth daily in the evening.  . hydrALAZINE (APRESOLINE) 25 MG tablet Take 25 mg by mouth 3 (three) times daily.   Marland Kitchen losartan-hydrochlorothiazide (HYZAAR) 100-12.5 MG tablet Take 1 tablet by mouth daily.  Marland Kitchen omeprazole (PRILOSEC) 40 MG capsule Take 40 mg by mouth daily.  . Potassium Chloride ER 20 MEQ TBCR Take 20 mEq by mouth in the morning and at bedtime.     Allergies:   Amlodipine besylate, Buprenorphine hcl, Morphine and related, and Morphine and related   Social History   Tobacco Use  . Smoking status: Never Smoker  . Smokeless tobacco: Never Used  Vaping Use  . Vaping Use: Never used  Substance Use Topics  . Alcohol use: No  . Drug use: No     Family Hx: The patient's family history includes COPD in her father; Cancer in her mother; Heart failure in her sister; Hypertension in her father and mother.  ROS:   Please see the history of present illness.     All other systems reviewed and are negative.   Prior CV studies:   The following studies were reviewed today:    Labs/Other Tests and Data Reviewed:      Recent Labs: 10/29/2020: BUN 14; Creatinine, Ser 0.89; NT-Pro BNP 898; Potassium 3.7; Sodium 141 11/26/2020: Hemoglobin 14.7; Platelets 239   Recent Lipid Panel No results found for: CHOL, TRIG, HDL, CHOLHDL, LDLCALC, LDLDIRECT  Wt Readings from Last 3 Encounters:  01/07/21 185 lb (83.9 kg)  11/26/20 185 lb (83.9 kg)  10/29/20 186 lb 12.8 Barrett (84.7 kg)     Risk Assessment/Calculations:     CHA2DS2-VASc Score =   5 This indicates a  % annual risk of  stroke. The patient's score is based upon: CHF History: Yes HTN History: Yes Vascular Disease History: No Age Score: 2 Gender Score: 1      Objective:    Vital Signs:  BP (!) 144/81   Pulse 85   Ht 5\' 7"  (1.702 m)   Wt 185 lb (83.9 kg)   SpO2 96%   BMI 28.98  kg/m    VITAL SIGNS:  reviewed GEN:  no acute distress EYES:  sclerae anicteric, EOMI - Extraocular Movements Intact RESPIRATORY:  normal respiratory effort, symmetric expansion CARDIOVASCULAR:  no peripheral edema SKIN:  no rash, lesions or ulcers. MUSCULOSKELETAL:  no obvious deformities. NEURO:  alert and oriented x 3, no obvious focal deficit PSYCH:  normal affect  ASSESSMENT & PLAN:    Rate is controlled not anticoagulated she again made a decision after discussion of benefits and risk on the basis of finances but did agree if unimproved she would except anticoagulation plan for cardioversion after 30 days.  I suspect atrial fibrillation is a major factor in her exercise intolerance and shortness of breath. Heart failure appears to be improved with her present loop diuretic I need to access the chest x-ray that was done by the pulmonary physician and reassess in the office after transition to [facility] Muscle weakness is a prominent complaint is at risk for statin myopathy we will discontinue her statin Shortness of breath multifactorial atrial fibrillation heart failure and likely a component of deconditioning.  Await records from pulmonary.       COVID-19 Education: The signs and symptoms of COVID-19 were discussed with the patient and how to seek care for testing (follow up with PCP or arrange E-visit).  The importance of social distancing was discussed today.  Time:   Today, I have spent 30 minutes with the patient with telehealth technology discussing the above problems.     Medication Adjustments/Labs and Tests Ordered: Current medicines are reviewed at length with the patient today.  Concerns regarding  medicines are outlined above.   Tests Ordered: No orders of the defined types were placed in this encounter.   Medication Changes: No orders of the defined types were placed in this encounter.   Follow Up:  In Person in 1 month(s)  Signed, Shirlee More, MD  01/07/2021 10:18 AM    [place] [facility]

## 2021-01-07 ENCOUNTER — Telehealth: Payer: Medicare HMO | Admitting: Cardiology

## 2021-01-07 ENCOUNTER — Telehealth (INDEPENDENT_AMBULATORY_CARE_PROVIDER_SITE_OTHER): Payer: Medicare HMO | Admitting: Cardiology

## 2021-01-07 ENCOUNTER — Encounter: Payer: Self-pay | Admitting: Cardiology

## 2021-01-07 VITALS — BP 144/81 | HR 85 | Ht 67.0 in | Wt 185.0 lb

## 2021-01-07 DIAGNOSIS — R0602 Shortness of breath: Secondary | ICD-10-CM

## 2021-01-07 DIAGNOSIS — Z7901 Long term (current) use of anticoagulants: Secondary | ICD-10-CM | POA: Diagnosis not present

## 2021-01-07 DIAGNOSIS — I4819 Other persistent atrial fibrillation: Secondary | ICD-10-CM

## 2021-01-07 DIAGNOSIS — I119 Hypertensive heart disease without heart failure: Secondary | ICD-10-CM | POA: Diagnosis not present

## 2021-01-07 MED ORDER — ENTRESTO 24-26 MG PO TABS: 1.0000 | ORAL_TABLET | Freq: Two times a day (BID) | ORAL | 3 refills | Status: DC

## 2021-01-07 NOTE — Patient Instructions (Signed)
Medication Instructions:  Your physician has recommended you make the following change in your medication:  STOP: Atorvastatin STOP: Losartan/HCTZ START: Entresto 24/26 mg take one tablet by mouth twice daily.  *If you need a refill on your cardiac medications before your next appointment, please call your pharmacy*   Lab Work: None If you have labs (blood work) drawn today and your tests are completely normal, you will receive your results only by: Marland Kitchen MyChart Message (if you have MyChart) OR . A paper copy in the mail If you have any lab test that is abnormal or we need to change your treatment, we will call you to review the results.   Testing/Procedures: None   Follow-Up: At [facility], you and your health needs are our priority.  As part of our continuing mission to provide you with exceptional heart care, we have created designated Provider Care Teams.  These Care Teams include your primary Cardiologist (physician) and Advanced Practice Providers (APPs -  Physician Assistants and Nurse Practitioners) who all work together to provide you with the care you need, when you need it.  We recommend signing up for the patient portal called "MyChart".  Sign up information is provided on this After Visit Summary.  MyChart is used to connect with patients for Virtual Visits (Telemedicine).  Patients are able to view lab/test results, encounter notes, upcoming appointments, etc.  Non-urgent messages can be sent to your provider as well.   To learn more about what you can do with MyChart, go to [email or web address].    Your next appointment:   1 month(s)  The format for your next appointment:   In Person  Provider:   Shirlee More, MD   Other Instructions

## 2021-02-05 ENCOUNTER — Ambulatory Visit: Payer: Medicare Other | Admitting: Cardiology

## 2021-02-06 NOTE — Progress Notes (Unsigned)
Cardiology Office Note:    Date:  02/07/2021   ID:  Briana Barrett, DOB [birth year], MRN [id number]  PCP:  [name], MD  Cardiologist:  Shirlee More, MD    Referring MD: [name], MD    ASSESSMENT:    1. Persistent atrial fibrillation ([place])   2. Chronic anticoagulation   3. Hypertensive heart disease without heart failure   4. Coronary artery calcification seen on CT scan    PLAN:    In order of problems listed above:  1. Her rate is controlled she remains quite symptomatic oriented is initiate anticoagulant see in the office in 1 month and plan cardioversion. 2. Aspirin initiate Eliquis 3. Continue current therapy including diuretic hydralazine Entresto and the key here is to resume sinus rhythm.   Next appointment: 4 weeks in anticipation of cardioversion   Medication Adjustments/Labs and Tests Ordered: Current medicines are reviewed at length with the patient today.  Concerns regarding medicines are outlined above.  Orders Placed This Encounter  Procedures  . EKG 12-Lead   No orders of the defined types were placed in this encounter.   Chief Complaint  Patient presents with  . Follow-up  . Atrial Fibrillation  . Congestive Heart Failure    History of Present Illness:    Briana Barrett is a 75 y.o. female with a hx of atrial fibrillation now persistent ,coronary artery calcification on CT scan hypertensive heart disease and dyslipidemia.  She was last seen 01/07/2021 .Her event monitor from 09/18/2020 3 days showed atrial fibrillation with good heart rate control Her Echocardiogram performed August 2021 showed an EF of 40 to 45% with pseudo normal diastolic function indicating elevated filling pressure.  She was transition from [facility] to [facility] and there is consideration of cardioversion if she remains symptomatic.  Compliance with diet, lifestyle and medications: Yes  Chest x-ray [facility] [place] 10/03/2020 no acute cardiopulmonary disease  shows moderate to large hiatal hernia.  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  She has age-indeterminate atrial fibrillation and will going to go ahead and attempt cardioversion if ineffective refer to EP for consultation.  She is not anticoagulated.  Will check in office EKG and if agreeable will stop aspirin put her on Eliquis 5 mg twice daily and schedule cardioversion 4 weeks.  She has mild edema no orthopnea chest pain palpitation or syncope.  She remains in rate controlled atrial fibrillation. Past Medical History:  Diagnosis Date  . Age-related osteoporosis without current pathological fracture 01/21/2016   Last Assessment & Plan:  Relevant Hx: Course: Daily Update: Today's Plan:she feels this is stable for her   Electronically signed by: [name], NP 05/11/16 1430  . Allergic rhinitis 01/21/2016   Last Assessment & Plan:  Relevant Hx: Course: Daily Update: Today's Plan:this is stable for her  Electronically signed by: [name], NP 05/11/16 1427  . Anemia, iron deficiency 01/21/2016   Last Assessment & Plan:  Her last iron level was 68 and she is taking the iron daily see her CBC  . Anxiety 04/13/2016   Last Assessment & Plan:  She is taking the xanax more daily and not her zoloft and she thinks it helps her more  . Atrial fibrillation ([place])   . Bradycardia 07/02/2017  . Calcification of lung 01/21/2016  . Coronary artery calcification seen on CT scan 01/21/2016  . DDD (degenerative disc  disease), lumbosacral 01/21/2016   Last Assessment & Plan:  Relevant Hx: Course: Daily Update: Today's Plan:she is working again at third shift at the [employer] and she is on her feet and that is making this worse for her  Electronically signed by: [name], NP 05/11/16 1429  . Diabetes mellitus type 2, controlled ([place]) 01/21/2016   Last Assessment & Plan:   She does not check her sugar as her last average was good for her   . Dyslipidemia 07/23/2016  . Encounter for long-term (current) use of high-risk medication 01/21/2016  . Episodic lightheadedness 07/02/2017  . Gastroesophageal reflux disease without esophagitis 01/21/2016  . Hiatal hernia 01/21/2016   Described as large on chest x-ray 2019  . History of compression fracture of spine 01/21/2016  . Hypercholesteremia   . Hypertension   . Hypertension, essential 08/27/2015   Last Assessment & Plan:  Her BP readings that she brings in here are up and down, she has a pending appt with cardiology to FU on this, and she has an extreme amount of stress at home as well that is contributing to this . She and I talked about her BP meds and she is taking a low dose of the clonidine but at this time she wants to monitor this, she is aware of how to take the losartan and is taki  . Impaired functional mobility, balance, gait, and endurance 10/19/2018  . Localized edema 01/21/2016  . Malaise and fatigue 01/21/2016   Last Assessment & Plan:  I really feel her S/S are tied to her BP and the heart rate and it not ideally being controlled for her with her inability to take multiple meds due to her S/e, she is frustrated with this and she stopped her amiodarone last Thursday, and she did not have any episodes since august 4-5, but is taking coreg as directed  . Malignant neoplasm of right breast ([place]) 01/21/2016  . Mixed hyperlipidemia 01/21/2016   Last Assessment & Plan:  Relevant Hx: Course: Daily Update: Today's Plan:update this for her fasting  Electronically signed by: [name], NP 05/11/16 1432  . Moderate recurrent major depression ([place]) 01/21/2016   Last Assessment & Plan:  Relevant Hx: Course: Daily Update: Today's Plan:this has been stable for her  Electronically signed by: [name], NP 05/11/16 1432  . 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 Osteoarthritis, generalized 01/21/2016   Last Assessment & Plan:  Relevant Hx: Course: Daily Update: Today's Plan:this is stable for her at this time  Electronically signed by: [name], NP 05/11/16 1430  . Ovarian cyst, right 01/21/2016  . Paroxysmal atrial flutter ([place]) 07/23/2016   Overview:  Chads score equals 1, prefers aspirin only Overview:  Overview:  Chads score equals 1, prefers aspirin only  . Plantar fasciitis   . Primary insomnia 04/13/2016   Last Assessment & Plan:  Relevant Hx: Course: Daily Update: Today's Plan:this has been more difficult with her working her third shift  Electronically signed by: [name], NP 05/11/16 1433  . Renal cyst, right 01/21/2016  . Swelling 07/02/2017  . 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 ANKLE SURGERY    . BREAST SURGERY    . CATARACT EXTRACTION    . CHOLECYSTECTOMY    . FOOT SURGERY     right  .  SHOULDER SURGERY    . SKIN SURGERY  07/2020   nose  . TUBAL LIGATION      Current Medications: Current Meds  Medication Sig  . ALPRAZolam (XANAX) 0.5 MG tablet Take 1 tablet by mouth twice a day as needed.  Refills after prescription expiration require follow-up evaluation at the office.  Marland Kitchen aspirin EC 81 MG tablet Take 1 tablet (81 mg total) by mouth daily. Swallow whole.  Marland Kitchen Fluticasone-Umeclidin-Vilant (TRELEGY ELLIPTA) 100-62.5-25 MCG/INH AEPB Inhale 1 puff into the lungs daily.  . furosemide (LASIX) 20 MG tablet Take two tablets (40 mg) total by mouth daily in the morning and one tablet (20 mg) total by mouth daily in the evening.  . hydrALAZINE (APRESOLINE) 25 MG tablet Take 25 mg by mouth 3 (three) times daily.   Marland Kitchen omeprazole (PRILOSEC) 40 MG capsule Take 40 mg by mouth daily.  . potassium chloride SA (KLOR-CON) 20 MEQ tablet Take 1 tablet by mouth 2 (two) times daily.  . sacubitril-valsartan  (ENTRESTO) 24-26 MG Take 1 tablet by mouth 2 (two) times daily.  . [DISCONTINUED] Ipratropium-Albuterol (COMBIVENT) 20-100 MCG/ACT AERS respimat Inhale 1 puff into the lungs 2 (two) times daily.     Allergies:   Amlodipine besylate, Buprenorphine hcl, Morphine and related, and Morphine and related   Social History   Socioeconomic History  . Marital status: Married    Spouse name: Not on file  . Number of children: Not on file  . Years of education: Not on file  . Highest education level: Not on file  Occupational History  . Not on file  Tobacco Use  . Smoking status: Never Smoker  . Smokeless tobacco: Never Used  Vaping Use  . Vaping Use: Never used  Substance and Sexual Activity  . Alcohol use: No  . Drug use: No  . Sexual activity: Not on file  Other Topics Concern  . Not on file  Social History Narrative   ** Merged History Encounter **       Social Determinants of Health   Financial Resource Strain: Not on file  Food Insecurity: Not on file  Transportation Needs: Not on file  Physical Activity: Not on file  Stress: Not on file  Social Connections: Not on file     Family History: The patient's family history includes COPD in her father; Cancer in her mother; Heart failure in her sister; Hypertension in her father and mother. ROS:   Please see the history of present illness.    All other systems reviewed and are negative.  EKGs/Labs/Other Studies Reviewed:    The following studies were reviewed today:  EKG:  EKG ordered today and personally reviewed.  The ekg ordered today demonstrates atrial fibrillation controlled ventricular rate left axis deviation left anterior hemiblock.  Recent Labs: 10/29/2020: BUN 14; Creatinine, Ser 0.89; NT-Pro BNP 898; Potassium 3.7; Sodium 141 11/26/2020: Hemoglobin 14.7; Platelets 239  Recent Lipid Panel No results found for: CHOL, TRIG, HDL, CHOLHDL, VLDL, LDLCALC, LDLDIRECT  Physical Exam:    VS:  BP 126/86   Pulse 100    Ht 5\' 6"  (1.676 m)   Wt 188 lb (85.3 kg)   SpO2 98%   BMI 30.34 kg/m     Wt Readings from Last 3 Encounters:  02/07/21 188 lb (85.3 kg)  01/07/21 185 lb (83.9 kg)  11/26/20 185 lb (83.9 kg)     GEN:  Well nourished, well developed in no acute distress HEENT: Normal NECK: No JVD; No carotid  bruits LYMPHATICS: No lymphadenopathy CARDIAC: Irregular S1 variable RRR, no murmurs, rubs, gallops RESPIRATORY:  Clear to auscultation without rales, wheezing or rhonchi  ABDOMEN: Soft, non-tender, non-distended MUSCULOSKELETAL:  No edema; No deformity  SKIN: Warm and dry NEUROLOGIC:  Alert and oriented x 3 PSYCHIATRIC:  Normal affect    Signed, Shirlee More, MD  02/07/2021 10:28 AM    [facility]

## 2021-02-07 ENCOUNTER — Encounter: Payer: Self-pay | Admitting: Cardiology

## 2021-02-07 ENCOUNTER — Ambulatory Visit: Payer: Medicare HMO | Admitting: Cardiology

## 2021-02-07 ENCOUNTER — Other Ambulatory Visit: Payer: Self-pay

## 2021-02-07 VITALS — BP 126/86 | HR 100 | Ht 66.0 in | Wt 188.0 lb

## 2021-02-07 DIAGNOSIS — I4819 Other persistent atrial fibrillation: Secondary | ICD-10-CM

## 2021-02-07 DIAGNOSIS — I119 Hypertensive heart disease without heart failure: Secondary | ICD-10-CM | POA: Diagnosis not present

## 2021-02-07 DIAGNOSIS — Z7901 Long term (current) use of anticoagulants: Secondary | ICD-10-CM

## 2021-02-07 MED ORDER — APIXABAN 5 MG PO TABS: 5.0000 mg | ORAL_TABLET | Freq: Two times a day (BID) | ORAL | 3 refills | Status: DC

## 2021-02-07 NOTE — Patient Instructions (Signed)
Medication Instructions:  Your physician has recommended you make the following change in your medication:  STOP: Aspirin START: Eliquis 5 mg take one tablet by mouth twice daily.  *If you need a refill on your cardiac medications before your next appointment, please call your pharmacy*   Lab Work: None If you have labs (blood work) drawn today and your tests are completely normal, you will receive your results only by: Marland Kitchen MyChart Message (if you have MyChart) OR . A paper copy in the mail If you have any lab test that is abnormal or we need to change your treatment, we will call you to review the results.   Testing/Procedures:    Follow-Up: At [facility], you and your health needs are our priority.  As part of our continuing mission to provide you with exceptional heart care, we have created designated Provider Care Teams.  These Care Teams include your primary Cardiologist (physician) and Advanced Practice Providers (APPs -  Physician Assistants and Nurse Practitioners) who all work together to provide you with the care you need, when you need it.  We recommend signing up for the patient portal called "MyChart".  Sign up information is provided on this After Visit Summary.  MyChart is used to connect with patients for Virtual Visits (Telemedicine).  Patients are able to view lab/test results, encounter notes, upcoming appointments, etc.  Non-urgent messages can be sent to your provider as well.   To learn more about what you can do with MyChart, go to [email or web address].    Your next appointment:   4 week(s)  The format for your next appointment:   In Person  Provider:   Shirlee More, MD   Other Instructions

## 2021-02-14 ENCOUNTER — Other Ambulatory Visit: Payer: Self-pay

## 2021-02-14 MED ORDER — FUROSEMIDE 20 MG PO TABS
ORAL_TABLET | ORAL | 3 refills | Status: DC
Start: 2021-02-14 — End: 2021-05-01

## 2021-02-14 NOTE — Telephone Encounter (Signed)
Furosemide sent to [facility]. Potassium CL was discontinue on last appt.

## 2021-02-18 ENCOUNTER — Telehealth: Payer: Self-pay | Admitting: Cardiology

## 2021-02-18 NOTE — Telephone Encounter (Signed)
New message:    Patient stating that she is taking a new medication and states she need a sooner apt, because she need a refill on them and she is not sure if need another refill. Please call patient

## 2021-02-18 NOTE — Telephone Encounter (Signed)
Called patient. She was upset thinking that she was seeing Dr. Bettina Gavia her 5 weeks after her last appointment when it was supposed to be 4 weeks I confirmed her next appointment is actually 4 weeks to the day from her last appointment. She understood. She wanted to know if she should pick up her eliquis and entresto prescriptions since she will run out soon I assured her yes she needs to pick them up. She understood she will be working on patient assistance for both medications I informed her the steps to take. No further questions.

## 2021-02-27 ENCOUNTER — Telehealth: Payer: Self-pay | Admitting: Cardiology

## 2021-02-27 NOTE — Telephone Encounter (Signed)
Patient calling the office for samples of medication:   1.  What medication and dosage are you requesting samples for? sacubitril-valsartan (ENTRESTO) 24-26 MG apixaban (ELIQUIS) 5 MG TABS tablet  2.  Are you currently out of this medication?   No, but she states she will be out of Eliquis on 03/04/21.

## 2021-02-27 NOTE — Telephone Encounter (Signed)
Spoke to the patient just now and let her know that these samples are ready for her to pick up at her earliest convenience. She verbalizes understanding and thanks me for the call back.

## 2021-03-06 NOTE — H&P (View-Only) (Signed)
Cardiology Office Note:    Date:  03/07/2021   ID:  Briana Barrett, DOB 08-02-1946, MRN [id number]  PCP:  [name], MD  Cardiologist:  Shirlee More, MD    Referring MD: [name], MD    ASSESSMENT:    1. Persistent atrial fibrillation (Briana Barrett)   2. Chronic anticoagulation   3. Hypertensive heart disease without heart failure    PLAN:    In order of problems listed above:  1. She remains in atrial fibrillation quite symptomatic with marked exercise intolerance and exertional shortness of breath but no edema orthopnea chest pain palpitation she is compliant with her anticoagulant and will plan cardioversion.  If ineffective referral to EP and continue her anticoagulant uninterrupted.  Her rate is controlled without suppressant therapy 2. Her blood pressure is relatively low 1 72-6 20 systolic decrease the dose of hydralazine and continue Entresto.  She has no volume overload check proBNP and continue her loop diuretic   Next appointment: 6 to 8 weeks   Medication Adjustments/Labs and Tests Ordered: Current medicines are reviewed at length with the patient today.  Concerns regarding medicines are outlined above.  Orders Placed This Encounter  Procedures  . EKG 12-Lead   No orders of the defined types were placed in this encounter.   Chief Complaint  Patient presents with  . Follow-up  . Diabetes Mellitus    History of Present Illness:    Briana Barrett is a 75 y.o. female with a hx of persistent atrial fibrillation she has initiated on anticoagulation is pending cardioversion, hypertensive heart disease and coronary artery calcification on CT scan last seen 02/07/2021.Her Echocardiogram performed August 2021 showed an EF of 40 to 45% with pseudo normal diastolic function indicating elevated filling pressure.  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 She remains quite symptomatic struggles with light housework and even with ADLs. Her predominant problem is fatigue and exercise intolerance. She has no edema orthopnea chest pain palpitation or syncope. She is compliant with her diuretic and anticoagulant without bleeding complication  Her granddaughter is present participate in the evaluation decision making and process of informed consent Past Medical History:  Diagnosis Date  . Age-related osteoporosis without current pathological fracture 01/21/2016   Last Assessment & Plan:  Relevant Hx: Course: Daily Update: Today's Plan:she feels this is stable for her   Electronically signed by: [name], NP 05/11/16 1430  . Allergic rhinitis 01/21/2016   Last Assessment & Plan:  Relevant Hx: Course: Daily Update: Today's Plan:this is stable for her  Electronically signed by: [name], NP 05/11/16 1427  . Anemia, iron deficiency 01/21/2016   Last Assessment & Plan:  Her last iron level was 68 and she is taking the iron daily see her CBC  . Anxiety 04/13/2016   Last Assessment & Plan:  She is taking the xanax more daily and not her zoloft and she thinks it helps her more  . Atrial fibrillation ([place])   . Bradycardia 07/02/2017  . Calcification of lung 01/21/2016  . Coronary artery calcification seen on CT scan 01/21/2016  . DDD (degenerative disc disease), lumbosacral 01/21/2016   Last Assessment & Plan:  Relevant Hx: Course: Daily Update: Today's Plan:she is working again at third shift at the [employer] and she is on her feet and that is making this worse for her  Electronically  signed by: [name], NP 05/11/16 1429  . Diabetes mellitus type 2, controlled ([place]) 01/21/2016   Last Assessment & Plan:  She does not check her sugar as her last average was good for her   . Dyslipidemia 07/23/2016  . Encounter for long-term (current) use of high-risk medication 01/21/2016  . Episodic  lightheadedness 07/02/2017  . Gastroesophageal reflux disease without esophagitis 01/21/2016  . Hiatal hernia 01/21/2016   Described as large on chest x-ray 2019  . History of compression fracture of spine 01/21/2016  . Hypercholesteremia   . Hypertension   . Hypertension, essential 08/27/2015   Last Assessment & Plan:  Her BP readings that she brings in here are up and down, she has a pending appt with cardiology to FU on this, and she has an extreme amount of stress at home as well that is contributing to this . She and I talked about her BP meds and she is taking a low dose of the clonidine but at this time she wants to monitor this, she is aware of how to take the losartan and is taki  . Impaired functional mobility, balance, gait, and endurance 10/19/2018  . Localized edema 01/21/2016  . Malaise and fatigue 01/21/2016   Last Assessment & Plan:  I really feel her S/S are tied to her BP and the heart rate and it not ideally being controlled for her with her inability to take multiple meds due to her S/e, she is frustrated with this and she stopped her amiodarone last Thursday, and she did not have any episodes since august 4-5, but is taking coreg as directed  . Malignant neoplasm of right breast ([place]) 01/21/2016  . Mixed hyperlipidemia 01/21/2016   Last Assessment & Plan:  Relevant Hx: Course: Daily Update: Today's Plan:update this for her fasting  Electronically signed by: [name], NP 05/11/16 1432  . Moderate recurrent major depression ([place]) 01/21/2016   Last Assessment & Plan:  Relevant Hx: Course: Daily Update: Today's Plan:this has been stable for her  Electronically signed by: [name], NP 05/11/16 1432  . 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[place]) 09/13/2015  . 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 Osteoarthritis, generalized 01/21/2016   Last Assessment & Plan:  Relevant Hx: Course: Daily Update: Today's Plan:this is stable for her  at this time  Electronically signed by: [name], NP 05/11/16 1430  . Ovarian cyst, right 01/21/2016  . Paroxysmal atrial flutter ([place]) 07/23/2016   Overview:  Chads score equals 1, prefers aspirin only Overview:  Overview:  Chads score equals 1, prefers aspirin only  . Plantar fasciitis   . Primary insomnia 04/13/2016   Last Assessment & Plan:  Relevant Hx: Course: Daily Update: Today's Plan:this has been more difficult with her working her third shift  Electronically signed by: [name], NP 05/11/16 1433  . Renal cyst, right 01/21/2016  . Swelling 07/02/2017  . 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 ANKLE SURGERY    . BREAST SURGERY    . CATARACT EXTRACTION    . CHOLECYSTECTOMY    . FOOT SURGERY     right  . SHOULDER SURGERY    . SKIN SURGERY  07/2020   nose  . TUBAL LIGATION      Current Medications: Current Meds  Medication Sig  . ALPRAZolam (XANAX) 0.5 MG tablet Take 1 tablet by mouth  twice a day as needed.  Refills after prescription expiration require follow-up evaluation at the office.  Marland Kitchen apixaban (ELIQUIS) 5 MG TABS tablet Take 1 tablet (5 mg total) by mouth 2 (two) times daily.  . Fluticasone-Umeclidin-Vilant (TRELEGY ELLIPTA) 100-62.5-25 MCG/INH AEPB Inhale 1 puff into the lungs daily.  . furosemide (LASIX) 20 MG tablet Take two tablets (40 mg) total by mouth daily in the morning and one tablet (20 mg) total by mouth daily in the evening.  . hydrALAZINE (APRESOLINE) 25 MG tablet Take 25 mg by mouth 3 (three) times daily.   Marland Kitchen omeprazole (PRILOSEC) 40 MG capsule Take 40 mg by mouth daily.  . potassium chloride SA (KLOR-CON) 20 MEQ tablet Take 1 tablet by mouth 2 (two) times daily.  . sacubitril-valsartan (ENTRESTO) 24-26 MG Take 1 tablet by mouth 2 (two) times daily.     Allergies:   Amlodipine besylate, Buprenorphine hcl, Morphine and related, and Morphine and related    Social History   Socioeconomic History  . Marital status: Married    Spouse name: Not on file  . Number of children: Not on file  . Years of education: Not on file  . Highest education level: Not on file  Occupational History  . Not on file  Tobacco Use  . Smoking status: Never Smoker  . Smokeless tobacco: Never Used  Vaping Use  . Vaping Use: Never used  Substance and Sexual Activity  . Alcohol use: No  . Drug use: No  . Sexual activity: Not on file  Other Topics Concern  . Not on file  Social History Narrative   ** Merged History Encounter **       Social Determinants of Health   Financial Resource Strain: Not on file  Food Insecurity: Not on file  Transportation Needs: Not on file  Physical Activity: Not on file  Stress: Not on file  Social Connections: Not on file     Family History: The patient's family history includes COPD in her father; Cancer in her mother; Heart failure in her sister; Hypertension in her father and mother. ROS:   Please see the history of present illness.    All other systems reviewed and are negative.  EKGs/Labs/Other Studies Reviewed:    The following studies were reviewed today:  EKG:  EKG ordered today and personally reviewed.  The ekg ordered today demonstrates atrial fibrillation controlled ventricular rate left bundle branch block QRS duration 124 ms  Recent Labs: 10/29/2020: BUN 14; Creatinine, Ser 0.89; NT-Pro BNP 898; Potassium 3.7; Sodium 141 11/26/2020: Hemoglobin 14.7; Platelets 239  Recent Lipid Panel No results found for: CHOL, TRIG, HDL, CHOLHDL, VLDL, LDLCALC, LDLDIRECT  Physical Exam:    VS:  BP 118/84   Pulse 76   Ht 5\' 6"  (1.676 m)   Wt 186 lb (84.4 kg)   SpO2 99%   BMI 30.02 kg/m     Wt Readings from Last 3 Encounters:  03/07/21 186 lb (84.4 kg)  02/07/21 188 lb (85.3 kg)  01/07/21 185 lb (83.9 kg)     GEN:  Well nourished, well developed in no acute distress HEENT: Normal NECK: No JVD; No carotid  bruits LYMPHATICS: No lymphadenopathy CARDIAC: S1 is variable irregular rhythm RRR, no murmurs, rubs, gallops RESPIRATORY:  Clear to auscultation without rales, wheezing or rhonchi  ABDOMEN: Soft, non-tender, non-distended MUSCULOSKELETAL:  No edema; No deformity she has stasis changes and varicosities lower extremities SKIN: Warm and dry NEUROLOGIC:  Alert and oriented x 3 PSYCHIATRIC:  Normal affect    Signed, Shirlee More, MD  03/07/2021 10:37 AM    [facility]

## 2021-03-06 NOTE — Progress Notes (Unsigned)
Cardiology Office Note:    Date:  03/07/2021   ID:  Briana Barrett, DOB 1946/05/13, MRN [id number]  PCP:  [name], MD  Cardiologist:  Shirlee More, MD    Referring MD: [name], MD    ASSESSMENT:    1. Persistent atrial fibrillation ([place])   2. Chronic anticoagulation   3. Hypertensive heart disease without heart failure    PLAN:    In order of problems listed above:  1. She remains in atrial fibrillation quite symptomatic with marked exercise intolerance and exertional shortness of breath but no edema orthopnea chest pain palpitation she is compliant with her anticoagulant and will plan cardioversion.  If ineffective referral to EP and continue her anticoagulant uninterrupted.  Her rate is controlled without suppressant therapy 2. Her blood pressure is relatively low 1 01-6 20 systolic decrease the dose of hydralazine and continue Entresto.  She has no volume overload check proBNP and continue her loop diuretic   Next appointment: 6 to 8 weeks   Medication Adjustments/Labs and Tests Ordered: Current medicines are reviewed at length with the patient today.  Concerns regarding medicines are outlined above.  Orders Placed This Encounter  Procedures  . EKG 12-Lead   No orders of the defined types were placed in this encounter.   Chief Complaint  Patient presents with  . Follow-up  . Diabetes Mellitus    History of Present Illness:    Briana Barrett is a 75 y.o. female with a hx of persistent atrial fibrillation she has initiated on anticoagulation is pending cardioversion, hypertensive heart disease and coronary artery calcification on CT scan last seen 02/07/2021.Her Echocardiogram performed August 2021 showed an EF of 40 to 45% with pseudo normal diastolic function indicating elevated filling pressure.  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 She remains quite symptomatic struggles with light housework and even with ADLs. Her predominant problem is fatigue and exercise intolerance. She has no edema orthopnea chest pain palpitation or syncope. She is compliant with her diuretic and anticoagulant without bleeding complication  Her granddaughter is present participate in the evaluation decision making and process of informed consent Past Medical History:  Diagnosis Date  . Age-related osteoporosis without current pathological fracture 01/21/2016   Last Assessment & Plan:  Relevant Hx: Course: Daily Update: Today's Plan:she feels this is stable for her   Electronically signed by: [name], NP 05/11/16 1430  . Allergic rhinitis 01/21/2016   Last Assessment & Plan:  Relevant Hx: Course: Daily Update: Today's Plan:this is stable for her  Electronically signed by: [name], NP 05/11/16 1427  . Anemia, iron deficiency 01/21/2016   Last Assessment & Plan:  Her last iron level was 68 and she is taking the iron daily see her CBC  . Anxiety 04/13/2016   Last Assessment & Plan:  She is taking the xanax more daily and not her zoloft and she thinks it helps her more  . Atrial fibrillation ([place])   . Bradycardia 07/02/2017  . Calcification of lung 01/21/2016  . Coronary artery calcification seen on CT scan 01/21/2016  . DDD (degenerative disc disease), lumbosacral 01/21/2016   Last Assessment & Plan:  Relevant Hx: Course: Daily Update: Today's Plan:she is working again at third shift at the [employer] and she is on her feet and that is making this worse for her  Electronically  signed by: [name], NP 05/11/16 1429  . Diabetes mellitus type 2, controlled ([place]) 01/21/2016   Last Assessment & Plan:  She does not check her sugar as her last average was good for her   . Dyslipidemia 07/23/2016  . Encounter for long-term (current) use of high-risk medication 01/21/2016  . Episodic  lightheadedness 07/02/2017  . Gastroesophageal reflux disease without esophagitis 01/21/2016  . Hiatal hernia 01/21/2016   Described as large on chest x-ray 2019  . History of compression fracture of spine 01/21/2016  . Hypercholesteremia   . Hypertension   . Hypertension, essential 08/27/2015   Last Assessment & Plan:  Her BP readings that she brings in here are up and down, she has a pending appt with cardiology to FU on this, and she has an extreme amount of stress at home as well that is contributing to this . She and I talked about her BP meds and she is taking a low dose of the clonidine but at this time she wants to monitor this, she is aware of how to take the losartan and is taki  . Impaired functional mobility, balance, gait, and endurance 10/19/2018  . Localized edema 01/21/2016  . Malaise and fatigue 01/21/2016   Last Assessment & Plan:  I really feel her S/S are tied to her BP and the heart rate and it not ideally being controlled for her with her inability to take multiple meds due to her S/e, she is frustrated with this and she stopped her amiodarone last Thursday, and she did not have any episodes since august 4-5, but is taking coreg as directed  . Malignant neoplasm of right breast ([place]) 01/21/2016  . Mixed hyperlipidemia 01/21/2016   Last Assessment & Plan:  Relevant Hx: Course: Daily Update: Today's Plan:update this for her fasting  Electronically signed by: [name], NP 05/11/16 1432  . Moderate recurrent major depression ([place]) 01/21/2016   Last Assessment & Plan:  Relevant Hx: Course: Daily Update: Today's Plan:this has been stable for her  Electronically signed by: [name], NP 05/11/16 1432  . 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[place]) 09/13/2015  . 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 Osteoarthritis, generalized 01/21/2016   Last Assessment & Plan:  Relevant Hx: Course: Daily Update: Today's Plan:this is stable for her  at this time  Electronically signed by: [name], NP 05/11/16 1430  . Ovarian cyst, right 01/21/2016  . Paroxysmal atrial flutter ([place]) 07/23/2016   Overview:  Chads score equals 1, prefers aspirin only Overview:  Overview:  Chads score equals 1, prefers aspirin only  . Plantar fasciitis   . Primary insomnia 04/13/2016   Last Assessment & Plan:  Relevant Hx: Course: Daily Update: Today's Plan:this has been more difficult with her working her third shift  Electronically signed by: [name], NP 05/11/16 1433  . Renal cyst, right 01/21/2016  . Swelling 07/02/2017  . 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 ANKLE SURGERY    . BREAST SURGERY    . CATARACT EXTRACTION    . CHOLECYSTECTOMY    . FOOT SURGERY     right  . SHOULDER SURGERY    . SKIN SURGERY  07/2020   nose  . TUBAL LIGATION      Current Medications: Current Meds  Medication Sig  . ALPRAZolam (XANAX) 0.5 MG tablet Take 1 tablet by mouth  twice a day as needed.  Refills after prescription expiration require follow-up evaluation at the office.  Marland Kitchen apixaban (ELIQUIS) 5 MG TABS tablet Take 1 tablet (5 mg total) by mouth 2 (two) times daily.  . Fluticasone-Umeclidin-Vilant (TRELEGY ELLIPTA) 100-62.5-25 MCG/INH AEPB Inhale 1 puff into the lungs daily.  . furosemide (LASIX) 20 MG tablet Take two tablets (40 mg) total by mouth daily in the morning and one tablet (20 mg) total by mouth daily in the evening.  . hydrALAZINE (APRESOLINE) 25 MG tablet Take 25 mg by mouth 3 (three) times daily.   Marland Kitchen omeprazole (PRILOSEC) 40 MG capsule Take 40 mg by mouth daily.  . potassium chloride SA (KLOR-CON) 20 MEQ tablet Take 1 tablet by mouth 2 (two) times daily.  . sacubitril-valsartan (ENTRESTO) 24-26 MG Take 1 tablet by mouth 2 (two) times daily.     Allergies:   Amlodipine besylate, Buprenorphine hcl, Morphine and related, and Morphine and related    Social History   Socioeconomic History  . Marital status: Married    Spouse name: Not on file  . Number of children: Not on file  . Years of education: Not on file  . Highest education level: Not on file  Occupational History  . Not on file  Tobacco Use  . Smoking status: Never Smoker  . Smokeless tobacco: Never Used  Vaping Use  . Vaping Use: Never used  Substance and Sexual Activity  . Alcohol use: No  . Drug use: No  . Sexual activity: Not on file  Other Topics Concern  . Not on file  Social History Narrative   ** Merged History Encounter **       Social Determinants of Health   Financial Resource Strain: Not on file  Food Insecurity: Not on file  Transportation Needs: Not on file  Physical Activity: Not on file  Stress: Not on file  Social Connections: Not on file     Family History: The patient's family history includes COPD in her father; Cancer in her mother; Heart failure in her sister; Hypertension in her father and mother. ROS:   Please see the history of present illness.    All other systems reviewed and are negative.  EKGs/Labs/Other Studies Reviewed:    The following studies were reviewed today:  EKG:  EKG ordered today and personally reviewed.  The ekg ordered today demonstrates atrial fibrillation controlled ventricular rate left bundle branch block QRS duration 124 ms  Recent Labs: 10/29/2020: BUN 14; Creatinine, Ser 0.89; NT-Pro BNP 898; Potassium 3.7; Sodium 141 11/26/2020: Hemoglobin 14.7; Platelets 239  Recent Lipid Panel No results found for: CHOL, TRIG, HDL, CHOLHDL, VLDL, LDLCALC, LDLDIRECT  Physical Exam:    VS:  BP 118/84   Pulse 76   Ht 5\' 6"  (1.676 m)   Wt 186 lb (84.4 kg)   SpO2 99%   BMI 30.02 kg/m     Wt Readings from Last 3 Encounters:  03/07/21 186 lb (84.4 kg)  02/07/21 188 lb (85.3 kg)  01/07/21 185 lb (83.9 kg)     GEN:  Well nourished, well developed in no acute distress HEENT: Normal NECK: No JVD; No carotid  bruits LYMPHATICS: No lymphadenopathy CARDIAC: S1 is variable irregular rhythm RRR, no murmurs, rubs, gallops RESPIRATORY:  Clear to auscultation without rales, wheezing or rhonchi  ABDOMEN: Soft, non-tender, non-distended MUSCULOSKELETAL:  No edema; No deformity she has stasis changes and varicosities lower extremities SKIN: Warm and dry NEUROLOGIC:  Alert and oriented x 3 PSYCHIATRIC:  Normal affect    Signed, Shirlee More, MD  03/07/2021 10:37 AM    [facility]

## 2021-03-07 ENCOUNTER — Other Ambulatory Visit: Payer: Self-pay

## 2021-03-07 ENCOUNTER — Encounter: Payer: Self-pay | Admitting: Cardiology

## 2021-03-07 ENCOUNTER — Ambulatory Visit: Payer: Medicare HMO | Admitting: Cardiology

## 2021-03-07 VITALS — BP 118/84 | HR 76 | Ht 66.0 in | Wt 186.0 lb

## 2021-03-07 DIAGNOSIS — Z7901 Long term (current) use of anticoagulants: Secondary | ICD-10-CM | POA: Diagnosis not present

## 2021-03-07 DIAGNOSIS — I4819 Other persistent atrial fibrillation: Secondary | ICD-10-CM | POA: Diagnosis not present

## 2021-03-07 DIAGNOSIS — I119 Hypertensive heart disease without heart failure: Secondary | ICD-10-CM | POA: Diagnosis not present

## 2021-03-07 MED ORDER — HYDRALAZINE HCL 25 MG PO TABS: 12.5000 mg | ORAL_TABLET | Freq: Three times a day (TID) | ORAL | 3 refills | Status: DC

## 2021-03-07 NOTE — Patient Instructions (Signed)
Medication Instructions:  Your physician has recommended you make the following change in your medication:  DECREASE: Hydralazine 12.5 mg take 0.5 tablet by mouth three times daily.  *If you need a refill on your cardiac medications before your next appointment, please call your pharmacy*   Lab Work: Your physician recommends that you return for lab work in: TODAY CBC, BMP, ProBNP If you have labs (blood work) drawn today and your tests are completely normal, you will receive your results only by: Marland Kitchen MyChart Message (if you have MyChart) OR . A paper copy in the mail If you have any lab test that is abnormal or we need to change your treatment, we will call you to review the results.   Testing/Procedures: You are scheduled for a Cardioversion on 03/12/2021 with Dr. Acie Fredrickson.  Please arrive at the [facility] (Main Entrance A) at [facility]: [address] [place], [place] [address] at 11 am.   DIET: Nothing to eat or drink after midnight except a sip of water with medications (see medication instructions below)  Medication Instructions:  Continue your anticoagulant: Eliquis You will need to continue your anticoagulant after your procedure until you are told by your provider that it is safe to stop.   Labs:  YOU HAD YOUR LABS DRAWN TODAY 03/07/2021  You must have a responsible person to drive you home and stay in the waiting area during your procedure. Failure to do so could result in cancellation.  Bring your insurance cards.  *Special Note: Every effort is made to have your procedure done on time. Occasionally there are emergencies that occur at the hospital that may cause delays. Please be patient if a delay does occur.     Follow-Up: At [facility], you and your health needs are our priority.  As part of our continuing mission to provide you with exceptional heart care, we have created designated Provider Care Teams.  These Care Teams include your primary Cardiologist  (physician) and Advanced Practice Providers (APPs -  Physician Assistants and Nurse Practitioners) who all work together to provide you with the care you need, when you need it.  We recommend signing up for the patient portal called "MyChart".  Sign up information is provided on this After Visit Summary.  MyChart is used to connect with patients for Virtual Visits (Telemedicine).  Patients are able to view lab/test results, encounter notes, upcoming appointments, etc.  Non-urgent messages can be sent to your provider as well.   To learn more about what you can do with MyChart, go to [email or web address].    Your next appointment:   8 week(s)  The format for your next appointment:   In Person  Provider:   Shirlee More, MD   Other Instructions

## 2021-03-08 LAB — CBC
Hematocrit: 45.9 % (ref 34.0–46.6)
Hemoglobin: 14.7 g/dL (ref 11.1–15.9)
MCH: 27.4 pg (ref 26.6–33.0)
MCHC: 32 g/dL (ref 31.5–35.7)
MCV: 86 fL (ref 79–97)
Platelets: 268 10*3/uL (ref 150–450)
RBC: 5.36 x10E6/uL — ABNORMAL HIGH (ref 3.77–5.28)
RDW: 12 % (ref 11.7–15.4)
WBC: 6.4 10*3/uL (ref 3.4–10.8)

## 2021-03-08 LAB — BASIC METABOLIC PANEL
BUN/Creatinine Ratio: 15 (ref 12–28)
BUN: 15 mg/dL (ref 8–27)
CO2: 22 mmol/L (ref 20–29)
Calcium: 9 mg/dL (ref 8.7–10.3)
Chloride: 101 mmol/L (ref 96–106)
Creatinine, Ser: 0.99 mg/dL (ref 0.57–1.00)
Glucose: 124 mg/dL — ABNORMAL HIGH (ref 65–99)
Potassium: 3.9 mmol/L (ref 3.5–5.2)
Sodium: 138 mmol/L (ref 134–144)
eGFR: 59 mL/min/{1.73_m2} — ABNORMAL LOW (ref >59)

## 2021-03-08 LAB — PRO B NATRIURETIC PEPTIDE: NT-Pro BNP: 1226 pg/mL — ABNORMAL HIGH (ref 0–738)

## 2021-03-10 ENCOUNTER — Other Ambulatory Visit (HOSPITAL_COMMUNITY)
Admission: RE | Admit: 2021-03-10 | Discharge: 2021-03-10 | Disposition: A | Discharge status: Home / Self Care | Payer: Medicare HMO | Source: Ambulatory Visit | Attending: Cardiovascular Disease | Admitting: Cardiovascular Disease

## 2021-03-10 ENCOUNTER — Telehealth: Payer: Self-pay

## 2021-03-10 DIAGNOSIS — Z20822 Contact with and (suspected) exposure to covid-19: Secondary | ICD-10-CM | POA: Diagnosis not present

## 2021-03-10 DIAGNOSIS — Z01812 Encounter for preprocedural laboratory examination: Secondary | ICD-10-CM | POA: Insufficient documentation

## 2021-03-10 NOTE — Telephone Encounter (Signed)
Left message on patients voicemail to please return our call.   

## 2021-03-10 NOTE — Telephone Encounter (Signed)
Results reviewed with pt as per Dr. Munley's note.  Pt verbalized understanding and had no additional questions. Routed to PCP  

## 2021-03-10 NOTE — Telephone Encounter (Signed)
-----   Message from Richardo Priest, MD sent at 03/09/2021  7:28 PM EDT ----- Good stable results for cardioversion

## 2021-03-10 NOTE — Telephone Encounter (Signed)
Patient returning call.

## 2021-03-11 LAB — SARS CORONAVIRUS 2 (TAT 6-24 HRS): SARS Coronavirus 2: NEGATIVE

## 2021-03-12 ENCOUNTER — Ambulatory Visit (HOSPITAL_COMMUNITY): Payer: Medicare HMO | Admitting: Certified Registered"

## 2021-03-12 ENCOUNTER — Ambulatory Visit (HOSPITAL_COMMUNITY)
Admission: RE | Admit: 2021-03-12 | Discharge: 2021-03-12 | Disposition: A | Discharge status: Home / Self Care | Payer: Medicare HMO | Attending: Cardiovascular Disease | Admitting: Cardiovascular Disease

## 2021-03-12 ENCOUNTER — Encounter (HOSPITAL_COMMUNITY): Payer: Self-pay | Admitting: Cardiovascular Disease

## 2021-03-12 ENCOUNTER — Encounter (HOSPITAL_COMMUNITY)
Admission: RE | Disposition: A | Discharge status: Home / Self Care | Payer: Self-pay | Source: Home / Self Care | Attending: Cardiovascular Disease

## 2021-03-12 DIAGNOSIS — Z79899 Other long term (current) drug therapy: Secondary | ICD-10-CM | POA: Diagnosis not present

## 2021-03-12 DIAGNOSIS — I119 Hypertensive heart disease without heart failure: Secondary | ICD-10-CM | POA: Insufficient documentation

## 2021-03-12 DIAGNOSIS — I4819 Other persistent atrial fibrillation: Secondary | ICD-10-CM | POA: Diagnosis present

## 2021-03-12 DIAGNOSIS — Z885 Allergy status to narcotic agent status: Secondary | ICD-10-CM | POA: Diagnosis not present

## 2021-03-12 DIAGNOSIS — Z7901 Long term (current) use of anticoagulants: Secondary | ICD-10-CM | POA: Insufficient documentation

## 2021-03-12 DIAGNOSIS — Z888 Allergy status to other drugs, medicaments and biological substances status: Secondary | ICD-10-CM | POA: Insufficient documentation

## 2021-03-12 HISTORY — PX: CARDIOVERSION: SHX1299

## 2021-03-12 SURGERY — CARDIOVERSION
Anesthesia: General

## 2021-03-12 MED ORDER — PROPOFOL 10 MG/ML IV BOLUS
INTRAVENOUS | Status: DC | PRN
Start: 2021-03-12 — End: 2021-03-12
  Administered 2021-03-12: 70 mg via INTRAVENOUS

## 2021-03-12 MED ORDER — SODIUM CHLORIDE 0.9 % IV SOLN: INTRAVENOUS | Status: DC | PRN

## 2021-03-12 MED ORDER — LIDOCAINE 2% (20 MG/ML) 5 ML SYRINGE
INTRAMUSCULAR | Status: DC | PRN
  Administered 2021-03-12: 60 mg via INTRAVENOUS

## 2021-03-12 NOTE — Transfer of Care (Signed)
Immediate Anesthesia Transfer of Care Note  Patient: Briana Barrett  Procedure(s) Performed: CARDIOVERSION (N/A )  Patient Location: Endoscopy Unit  Anesthesia Type:General  Level of Consciousness: drowsy  Airway & Oxygen Therapy: Patient Spontanous Breathing  Post-op Assessment: Report given to RN and Post -op Vital signs reviewed and stable  Post vital signs: Reviewed and stable  Last Vitals:  Vitals Value Taken Time  BP    Temp    Pulse    Resp    SpO2      Last Pain:  Vitals:   03/12/21 1115  TempSrc: Temporal  PainSc: 0-No pain         Complications: No complications documented.

## 2021-03-12 NOTE — CV Procedure (Signed)
    Cardioversion Note  Carina Chaplin [id number] Oct 03, 1946  Procedure: DC Cardioversion Indications: atrial fib   Procedure Details Consent: Obtained Time Out: Verified patient identification, verified procedure, site/side was marked, verified correct patient position, special equipment/implants available, Radiology Safety Procedures followed,  medications/allergies/relevent history reviewed, required imaging and test results available.  Performed  The patient has been on adequate anticoagulation.  The patient received IV Lidocaine 60 mg followed by Propofol 70 mg IV .  for sedation.  Synchronous cardioversion was performed at  200  joules.  The cardioversion was  Successful     Complications: No apparent complications Patient did tolerate procedure well.   Thayer Headings, Brooke Bonito., MD, [facility] 03/12/2021, 12:11 PM

## 2021-03-12 NOTE — Anesthesia Postprocedure Evaluation (Signed)
Anesthesia Post Note  Patient: Briana Barrett  Procedure(s) Performed: CARDIOVERSION (N/A )     Patient location during evaluation: Endoscopy Anesthesia Type: General Level of consciousness: sedated Pain management: pain level controlled Vital Signs Assessment: post-procedure vital signs reviewed and stable Respiratory status: spontaneous breathing Cardiovascular status: stable Postop Assessment: no apparent nausea or vomiting Anesthetic complications: no   No complications documented.  Last Vitals:  Vitals:   03/12/21 1220 03/12/21 1230  BP:    Pulse: 78 73  Resp: 19 13  Temp:    SpO2: 97% 98%    Last Pain:  Vitals:   03/12/21 1213  TempSrc: Temporal  PainSc:                  Huston Foley

## 2021-03-12 NOTE — Interval H&P Note (Signed)
History and Physical Interval Note:  03/12/2021 11:58 AM  Briana Barrett  has presented today for surgery, with the diagnosis of AFIB.  The various methods of treatment have been discussed with the patient and family. After consideration of risks, benefits and other options for treatment, the patient has consented to  Procedure(s): CARDIOVERSION (N/A) as a surgical intervention.  The patient's history has been reviewed, patient examined, no change in status, stable for surgery.  I have reviewed the patient's chart and labs.  Questions were answered to the patient's satisfaction.     Mertie Moores

## 2021-03-12 NOTE — Discharge Instructions (Signed)
YOU HAD AN CARDIAC PROCEDURE TODAY: Refer to the procedure report and other information in the discharge instructions given to you for any specific questions about what was found during the examination. If this information does not answer your questions, please call [facility] office at [phone] to clarify.   DIET: Your first meal following the procedure should be a light meal and then it is ok to progress to your normal diet. A half-sandwich or bowl of soup is an example of a good first meal. Heavy or fried foods are harder to digest and may make you feel nauseous or bloated. Drink plenty of fluids but you should avoid alcoholic beverages for 24 hours. If you had a esophageal dilation, please see attached instructions for diet.   ACTIVITY: Your care partner should take you home directly after the procedure. You should plan to take it easy, moving slowly for the rest of the day. You can resume normal activity the day after the procedure however YOU SHOULD NOT DRIVE, use power tools, machinery or perform tasks that involve climbing or major physical exertion for 24 hours (because of the sedation medicines used during the test).   SYMPTOMS TO REPORT IMMEDIATELY: A cardiologist can be reached at any hour. Please call [phone] for any of the following symptoms:   New, significant chest pain or pain under the shoulder blades  New shortness of breath   FOLLOW UP:  Please also call with any specific questions about appointments or follow up tests.   Electrical Cardioversion Electrical cardioversion is the delivery of a jolt of electricity to restore a normal rhythm to the heart. A rhythm that is too fast or is not regular keeps the heart from pumping well. In this procedure, sticky patches or metal paddles are placed on the chest to deliver electricity to the heart from a device. This procedure may be done in an emergency if:  There is low or no blood pressure as a result of the heart  rhythm.  Normal rhythm must be restored as fast as possible to protect the brain and heart from further damage.  It may save a life. This may also be a scheduled procedure for irregular or fast heart rhythms that are not immediately life-threatening. Tell a health care provider about:  Any allergies you have.  All medicines you are taking, including vitamins, herbs, eye drops, creams, and over-the-counter medicines.  Any problems you or family members have had with anesthetic medicines.  Any blood disorders you have.  Any surgeries you have had.  Any medical conditions you have.  Whether you are pregnant or may be pregnant. What are the risks? Generally, this is a safe procedure. However, problems may occur, including:  Allergic reactions to medicines.  A blood clot that breaks free and travels to other parts of your body.  The possible return of an abnormal heart rhythm within hours or days after the procedure.  Your heart stopping (cardiac arrest). This is rare. What happens before the procedure? Medicines  Your health care provider may have you start taking: ? Blood-thinning medicines (anticoagulants) so your blood does not clot as easily. ? Medicines to help stabilize your heart rate and rhythm.  Ask your health care provider about: ? Changing or stopping your regular medicines. This is especially important if you are taking diabetes medicines or blood thinners. ? Taking medicines such as aspirin and ibuprofen. These medicines can thin your blood. Do not take these medicines unless your health care provider  tells you to take them. ? Taking over-the-counter medicines, vitamins, herbs, and supplements. General instructions  Follow instructions from your health care provider about eating or drinking restrictions.  Plan to have someone take you home from the hospital or clinic.  If you will be going home right after the procedure, plan to have someone with you for 24  hours.  Ask your health care provider what steps will be taken to help prevent infection. These may include washing your skin with a germ-killing soap. What happens during the procedure?  An IV will be inserted into one of your veins.  Sticky patches (electrodes) or metal paddles may be placed on your chest.  You will be given a medicine to help you relax (sedative).  An electrical shock will be delivered. The procedure may vary among health care providers and hospitals.   What can I expect after the procedure?  Your blood pressure, heart rate, breathing rate, and blood oxygen level will be monitored until you leave the hospital or clinic.  Your heart rhythm will be watched to make sure it does not change.  You may have some redness on the skin where the shocks were given. Follow these instructions at home:  Do not drive for 24 hours if you were given a sedative during your procedure.  Take over-the-counter and prescription medicines only as told by your health care provider.  Ask your health care provider how to check your pulse. Check it often.  Rest for 48 hours after the procedure or as told by your health care provider.  Avoid or limit your caffeine use as told by your health care provider.  Keep all follow-up visits as told by your health care provider. This is important. Contact a health care provider if:  You feel like your heart is beating too quickly or your pulse is not regular.  You have a serious muscle cramp that does not go away. Get help right away if:  You have discomfort in your chest.  You are dizzy or you feel faint.  You have trouble breathing or you are short of breath.  Your speech is slurred.  You have trouble moving an arm or leg on one side of your body.  Your fingers or toes turn cold or blue. Summary  Electrical cardioversion is the delivery of a jolt of electricity to restore a normal rhythm to the heart.  This procedure may be done  right away in an emergency or may be a scheduled procedure if the condition is not an emergency.  Generally, this is a safe procedure.  After the procedure, check your pulse often as told by your health care provider. This information is not intended to replace advice given to you by your health care provider. Make sure you discuss any questions you have with your health care provider. Document Revised: 07/10/2019 Document Reviewed: 07/10/2019 Elsevier Patient Education  [place].

## 2021-03-12 NOTE — Anesthesia Preprocedure Evaluation (Signed)
Anesthesia Evaluation  Patient identified by MRN, date of birth, ID band Patient awake    Reviewed: Allergy & Precautions, NPO status , Patient's Chart, lab work & pertinent test results  Airway Mallampati: I       Dental no notable dental hx.    Pulmonary neg pulmonary ROS,    Pulmonary exam normal        Cardiovascular hypertension, Pt. on medications + CAD and +CHF  Normal cardiovascular exam     Neuro/Psych PSYCHIATRIC DISORDERS Anxiety Depression    GI/Hepatic GERD  Medicated,  Endo/Other  diabetes, Well Controlled, Type 2  Renal/GU      Musculoskeletal   Abdominal Normal abdominal exam  (+)   Peds  Hematology   Anesthesia Other Findings   Reproductive/Obstetrics                             Anesthesia Physical Anesthesia Plan  ASA: II  Anesthesia Plan: General   Post-op Pain Management:    Induction:   PONV Risk Score and Plan: Propofol infusion  Airway Management Planned: Natural Airway and Simple Face Mask  Additional Equipment: None  Intra-op Plan:   Post-operative Plan:   Informed Consent: I have reviewed the patients History and Physical, chart, labs and discussed the procedure including the risks, benefits and alternatives for the proposed anesthesia with the patient or authorized representative who has indicated his/her understanding and acceptance.     Dental advisory given  Plan Discussed with:   Anesthesia Plan Comments:         Anesthesia Quick Evaluation

## 2021-03-12 NOTE — Anesthesia Procedure Notes (Signed)
Procedure Name: General with mask airway Date/Time: 03/12/2021 12:03 PM Performed by: Imagene Riches, CRNA Pre-anesthesia Checklist: Patient identified, Emergency Drugs available, Suction available, Patient being monitored and Timeout performed Patient Re-evaluated:Patient Re-evaluated prior to induction Oxygen Delivery Method: Ambu bag

## 2021-03-15 ENCOUNTER — Encounter (HOSPITAL_COMMUNITY): Payer: Self-pay | Admitting: Cardiovascular Disease

## 2021-03-25 DIAGNOSIS — M170 Bilateral primary osteoarthritis of knee: Secondary | ICD-10-CM

## 2021-03-25 HISTORY — DX: Bilateral primary osteoarthritis of knee: M17.0

## 2021-04-21 ENCOUNTER — Telehealth: Payer: Self-pay | Admitting: Oncology

## 2021-04-21 NOTE — Telephone Encounter (Signed)
Patient called to scheduled Follow Up after her scheduled 5/5 Mammogram.  Patient scheduled for 5/12 at 9:30 am to go over the Results

## 2021-04-28 ENCOUNTER — Telehealth: Payer: Self-pay | Admitting: Cardiology

## 2021-04-28 NOTE — Telephone Encounter (Signed)
Pt aware that we have samples for her up front. Entresto [id number] 7/24 Eliquis [id number]

## 2021-04-28 NOTE — Telephone Encounter (Signed)
Patient calling the office for samples of medication:   1.  What medication and dosage are you requesting samples for? Enstressto and Elquis  2.  Are you currently out of this medication? Patient has one week worth

## 2021-04-29 NOTE — Progress Notes (Signed)
[facility]  [address] [place],  [place]  [address] [phone]  Clinic Day:  05/01/2021  Referring physician: [name], MD  This document serves as a record of services personally performed by Dequincy Macarthur Critchley, MD. It was created on their behalf by [facility], a trained medical scribe. The creation of this record is based on the scribe's personal observations and the provider's statements to them.  HISTORY OF PRESENT ILLNESS:  The patient is a 75 y.o. female with stage IA (T1a N0 M0) hormone positive breast cancer, status post a lumpectomy in November 2013.  She also has a history of iron deficiency anemia, for which IV and oral iron were effective in replenishing her iron stores.  She comes in today for her annual follow-up.  Since her last visit, the patient has been doing well.  She denies having any changes in her breasts which concern her for disease recurrence.  From an iron deficiency anemia standpoint, she denies having increased fatigue or any overt forms of blood loss.  Of note, her mammogram done last week showed benign calcifications for which short-term follow-up was recommended.     PHYSICAL EXAM:  Blood pressure (!) 160/88, pulse 84, temperature 99.1 F (37.3 C), resp. rate 16, height 5\' 6"  (1.676 m), weight 184 lb (83.5 kg), SpO2 97 %. Wt Readings from Last 3 Encounters:  05/01/21 184 lb (83.5 kg)  03/07/21 186 lb (84.4 kg)  02/07/21 188 lb (85.3 kg)   Body mass index is 29.7 kg/m. Performance status (ECOG): 0 - Asymptomatic Physical Exam Constitutional:      Appearance: Normal appearance. She is not ill-appearing.  HENT:     Mouth/Throat:     Mouth: Mucous membranes are moist.     Pharynx: Oropharynx is clear. No oropharyngeal exudate or posterior oropharyngeal erythema.  Cardiovascular:     Rate and Rhythm: Normal rate and regular rhythm.     Heart sounds: No murmur heard. No friction rub. No gallop.    Pulmonary:     Effort: Pulmonary effort is normal. No respiratory distress.     Breath sounds: Normal breath sounds. No wheezing, rhonchi or rales.  Chest:  Breasts:     Right: No axillary adenopathy or supraclavicular adenopathy.     Left: No axillary adenopathy or supraclavicular adenopathy.    Abdominal:     General: Bowel sounds are normal. There is no distension.     Palpations: Abdomen is soft. There is no mass.     Tenderness: There is no abdominal tenderness.  Musculoskeletal:        General: No swelling.     Right lower leg: No edema.     Left lower leg: No edema.  Lymphadenopathy:     Cervical: No cervical adenopathy.     Upper Body:     Right upper body: No supraclavicular or axillary adenopathy.     Left upper body: No supraclavicular or axillary adenopathy.     Lower Body: No right inguinal adenopathy. No left inguinal adenopathy.  Skin:    General: Skin is warm.     Coloration: Skin is not jaundiced.     Findings: No lesion or rash.  Neurological:     General: No focal deficit present.     Mental Status: She is alert and oriented to person, place, and time. Mental status is at baseline.     Cranial Nerves: Cranial nerves are intact.  Psychiatric:  Mood and Affect: Mood normal.        Behavior: Behavior normal.        Thought Content: Thought content normal.     STUDIES: EXAM: 04/24/2021 DIGITAL DIAGNOSTIC UNILATERAL RIGHT MAMMOGRAM WITH TOMOSYNTHESIS AND CAD  FINDINGS: Magnified views are performed of calcifications in the UPPER INNER QUADRANT of the RIGHT breast. On magnified views there is a group of coarse dystrophic calcifications spanning 0.5 x 0.5 x 0.3 centimeters. No suspicious morphology or distribution of calcifications identified. Calcifications appear stable. No new or suspicious findings in the RIGHT breast.  IMPRESSION: Stable, likely benign RIGHT breast calcifications.  LABS:   CBC Latest Ref Rng & Units 03/07/2021 11/26/2020  07/23/2020  WBC 3.4 - 10.8 x10E3/uL 6.4 13.2 7.6  Hemoglobin 11.1 - 15.9 g/dL 14.7 14.7 15.4  Hematocrit 34.0 - 46.6 % 45.9 45 46.9(H)  Platelets 150 - 450 x10E3/uL 268 239 259   CMP Latest Ref Rng & Units 03/07/2021 10/29/2020 07/23/2020  Glucose 65 - 99 mg/dL 124(H) 126(H) 94  BUN 8 - 27 mg/dL 15 14 16   Creatinine 0.57 - 1.00 mg/dL 0.99 0.89 0.99  Sodium 134 - 144 mmol/L 138 141 143  Potassium 3.5 - 5.2 mmol/L 3.9 3.7 4.2  Chloride 96 - 106 mmol/L 101 101 104  CO2 20 - 29 mmol/L 22 26 26   Calcium 8.7 - 10.3 mg/dL 9.0 9.0 9.0    Lab Results  Component Value Date   TIBC 359 11/26/2020   FERRITIN 54.8 11/26/2020   IRONPCTSAT 21.7 11/26/2020     Ref. Range 11/26/2020 00:00  Iron Unknown 78  TIBC Unknown 359  %SAT Unknown 21.7  Ferritin Unknown 54.8    ASSESSMENT & PLAN:   Assessment/Plan:  A 75 y.o. female with stage IA (T1a N0 M0) hormone positive breast cancer, status post a lumpectomy in November 2013.  Based upon her clinical breast exam today and recent mammography, she appears to be disease free.  She is already scheduled for a repeat mammogram in 6 months for her continued disease surveillance.  From a hematologic standpoint, her recent hemoglobin and iron levels remain fine.  I will see her back in 1 year for repeat clinical assessment.  The patient understands all the plans discussed today and is in agreement with them.     I, Rita Ohara, am acting as scribe for Marice Potter, MD    I have reviewed this report as typed by the medical scribe, and it is complete and accurate.  Dequincy Macarthur Critchley, MD

## 2021-04-30 ENCOUNTER — Other Ambulatory Visit: Payer: Self-pay | Admitting: Oncology

## 2021-04-30 DIAGNOSIS — D509 Iron deficiency anemia, unspecified: Secondary | ICD-10-CM

## 2021-05-01 ENCOUNTER — Other Ambulatory Visit: Payer: Self-pay

## 2021-05-01 ENCOUNTER — Other Ambulatory Visit: Payer: Self-pay | Admitting: Oncology

## 2021-05-01 ENCOUNTER — Other Ambulatory Visit: Payer: Self-pay | Admitting: Cardiology

## 2021-05-01 ENCOUNTER — Inpatient Hospital Stay: Payer: Medicare HMO | Attending: Oncology | Admitting: Oncology

## 2021-05-01 ENCOUNTER — Telehealth: Payer: Self-pay | Admitting: Oncology

## 2021-05-01 VITALS — BP 160/88 | HR 84 | Temp 99.1°F | Resp 16 | Ht 66.0 in | Wt 184.0 lb

## 2021-05-01 DIAGNOSIS — C50411 Malignant neoplasm of upper-outer quadrant of right female breast: Secondary | ICD-10-CM | POA: Diagnosis not present

## 2021-05-01 DIAGNOSIS — D509 Iron deficiency anemia, unspecified: Secondary | ICD-10-CM

## 2021-05-01 DIAGNOSIS — Z170 Estrogen receptor positive status [ER+]: Secondary | ICD-10-CM

## 2021-05-01 NOTE — Telephone Encounter (Signed)
Per 5/12 los next appt scheduled and given to patient.Mammogram order sent to scheduling

## 2021-05-13 ENCOUNTER — Ambulatory Visit: Payer: Medicare HMO | Admitting: Cardiology

## 2021-05-26 ENCOUNTER — Other Ambulatory Visit: Payer: Self-pay

## 2021-05-26 MED ORDER — POTASSIUM CHLORIDE CRYS ER 20 MEQ PO TBCR: 20.0000 meq | EXTENDED_RELEASE_TABLET | Freq: Two times a day (BID) | ORAL | 2 refills | Status: DC

## 2021-05-26 NOTE — Telephone Encounter (Signed)
Refill of Potassium Chloride 20 mEq sent to [facility].

## 2021-05-27 ENCOUNTER — Encounter: Payer: Self-pay | Admitting: Cardiology

## 2021-05-27 ENCOUNTER — Ambulatory Visit: Payer: Medicare HMO | Admitting: Cardiology

## 2021-05-27 ENCOUNTER — Other Ambulatory Visit: Payer: Self-pay

## 2021-05-27 VITALS — BP 126/70 | HR 74 | Ht 66.0 in | Wt 181.6 lb

## 2021-05-27 DIAGNOSIS — I119 Hypertensive heart disease without heart failure: Secondary | ICD-10-CM | POA: Diagnosis not present

## 2021-05-27 DIAGNOSIS — Z7901 Long term (current) use of anticoagulants: Secondary | ICD-10-CM

## 2021-05-27 DIAGNOSIS — I4819 Other persistent atrial fibrillation: Secondary | ICD-10-CM | POA: Diagnosis not present

## 2021-05-27 MED ORDER — FUROSEMIDE 20 MG PO TABS: 20.0000 mg | ORAL_TABLET | Freq: Two times a day (BID) | ORAL | 3 refills | Status: DC

## 2021-05-27 NOTE — Patient Instructions (Signed)
Medication Instructions:  Your physician has recommended you make the following change in your medication:   Stop hydrochlorothiazide Decrease your furosemide to 20 mg 1 tablet twice daaily.  *If you need a refill on your cardiac medications before your next appointment, please call your pharmacy*   Lab Work: None ordered If you have labs (blood work) drawn today and your tests are completely normal, you will receive your results only by: Marland Kitchen MyChart Message (if you have MyChart) OR . A paper copy in the mail If you have any lab test that is abnormal or we need to change your treatment, we will call you to review the results.   Testing/Procedures: None ordered   Follow-Up: At [facility], you and your health needs are our priority.  As part of our continuing mission to provide you with exceptional heart care, we have created designated Provider Care Teams.  These Care Teams include your primary Cardiologist (physician) and Advanced Practice Providers (APPs -  Physician Assistants and Nurse Practitioners) who all work together to provide you with the care you need, when you need it.  We recommend signing up for the patient portal called "MyChart".  Sign up information is provided on this After Visit Summary.  MyChart is used to connect with patients for Virtual Visits (Telemedicine).  Patients are able to view lab/test results, encounter notes, upcoming appointments, etc.  Non-urgent messages can be sent to your provider as well.   To learn more about what you can do with MyChart, go to [email or web address].    Your next appointment:   3 month(s)  The format for your next appointment:   In Person  Provider:   Shirlee More, MD   Other Instructions NA

## 2021-05-27 NOTE — Progress Notes (Signed)
Cardiology Office Note:    Date:  05/27/2021   ID:  Briana Barrett, DOB [birth year], MRN [id number]  PCP:  Briana Barrett., MD  Cardiologist:  Shirlee More, MD    Referring MD: Briana Barrett., MD    ASSESSMENT:    1. Persistent atrial fibrillation ([place])   2. Chronic anticoagulation   3. Hypertensive heart disease without heart failure    PLAN:    In order of problems listed above:  1. Unfortunately back in atrial fibrillation post cardioversion rate is controlled she is anticoagulated I think she do best to be seen by EP for consideration of PVI with cardiomyopathy and heart failure or alternatively to load her with amiodarone and repeat cardioversion as an outpatient. 2. Continue anticoagulation 3. Symptomatic hypotension associated COVID-19 infection, will discontinue hydralazine and reduce her loop diuretic and continue to trend blood pressure at home   Next appointment: 3 months    Medication Adjustments/Labs and Tests Ordered: Current medicines are reviewed at length with the patient today.  Concerns regarding medicines are outlined above.  No orders of the defined types were placed in this encounter.  No orders of the defined types were placed in this encounter.   Chief Complaint  Patient presents with  . Follow-up  . Atrial Fibrillation    History of Present Illness:    Briana Barrett is a 76 y.o. female with a hx of persistent atrial fibrillation anticoagulation and hypertensive heart disease last seen 03/07/2021.  She was subsequently cardioverted to sinus rhythm 03/12/2021.  She was seen by her PCP after an episode of syncope associated with chills diffuse myalgia diarrhea and weakness associated with COVID-19 infection.  Laboratory studies performed that day shows a white count of 5300 hemoglobin 14.7 platelets 204,000 potassium 4.2 GFR 75 cc Compliance with diet, lifestyle and medications: Yes  She still does not feel well Occasional  lightheaded systolic blood pressures less than 100. No shortness of breath edema chest pain no recurrent syncope. She is in atrial fibrillation today was unaware she was in atrial fibrillation in the emergency room at [facility]. With atrial fibrillation heart failure and cardiomyopathy ongoing refer to EP for consideration of PVI or drug therapy with amiodarone and repeat cardioversion. We will decrease her loop diuretic and stop hydralazine. Past Medical History:  Diagnosis Date  . Age-related osteoporosis without current pathological fracture 01/21/2016   Last Assessment & Plan:  Relevant Hx: Course: Daily Update: Today's Plan:she feels this is stable for her   Electronically signed by: [name], NP 05/11/16 1430  . Allergic rhinitis 01/21/2016   Last Assessment & Plan:  Relevant Hx: Course: Daily Update: Today's Plan:this is stable for her  Electronically signed by: [name], NP 05/11/16 1427  . Anemia, iron deficiency 01/21/2016   Last Assessment & Plan:  Her last iron level was 68 and she is taking the iron daily see her CBC  . Anxiety 04/13/2016   Last Assessment & Plan:  She is taking the xanax more daily and not her zoloft and she thinks it helps her more  . Atrial fibrillation ([place])   . Bradycardia 07/02/2017  . Calcification of lung 01/21/2016  . Coronary artery calcification seen on CT scan 01/21/2016  . DDD (degenerative disc disease), lumbosacral 01/21/2016   Last Assessment & Plan:  Relevant Hx: Course: Daily Update: Today's Plan:she is working again at third shift at the [employer] and she is on her feet and that is making  this worse for her  Electronically signed by: [name], NP 05/11/16 1429  . Diabetes mellitus type 2, controlled ([place]) 01/21/2016   Last Assessment & Plan:  She does not check her sugar as her last average was good for her   . Dyslipidemia 07/23/2016  . Encounter for long-term (current) use of high-risk  medication 01/21/2016  . Episodic lightheadedness 07/02/2017  . Gastroesophageal reflux disease without esophagitis 01/21/2016  . Hiatal hernia 01/21/2016   Described as large on chest x-ray 2019  . History of compression fracture of spine 01/21/2016  . Hypercholesteremia   . Hypertension   . Hypertension, essential 08/27/2015   Last Assessment & Plan:  Her BP readings that she brings in here are up and down, she has a pending appt with cardiology to FU on this, and she has an extreme amount of stress at home as well that is contributing to this . She and I talked about her BP meds and she is taking a low dose of the clonidine but at this time she wants to monitor this, she is aware of how to take the losartan and is taki  . Impaired functional mobility, balance, gait, and endurance 10/19/2018  . Localized edema 01/21/2016  . Malaise and fatigue 01/21/2016   Last Assessment & Plan:  I really feel her S/S are tied to her BP and the heart rate and it not ideally being controlled for her with her inability to take multiple meds due to her S/e, she is frustrated with this and she stopped her amiodarone last Thursday, and she did not have any episodes since august 4-5, but is taking coreg as directed  . Malignant neoplasm of right breast ([place]) 01/21/2016  . Mixed hyperlipidemia 01/21/2016   Last Assessment & Plan:  Relevant Hx: Course: Daily Update: Today's Plan:update this for her fasting  Electronically signed by: [name], NP 05/11/16 1432  . Moderate recurrent major depression ([place]) 01/21/2016   Last Assessment & Plan:  Relevant Hx: Course: Daily Update: Today's Plan:this has been stable for her  Electronically signed by: [name], NP 05/11/16 1432  . 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[place]) 09/13/2015  . 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 Osteoarthritis, generalized 01/21/2016   Last Assessment & Plan:  Relevant Hx: Course: Daily Update:  Today's Plan:this is stable for her at this time  Electronically signed by: [name], NP 05/11/16 1430  . Ovarian cyst, right 01/21/2016  . Paroxysmal atrial flutter ([place]) 07/23/2016   Overview:  Chads score equals 1, prefers aspirin only Overview:  Overview:  Chads score equals 1, prefers aspirin only  . Plantar fasciitis   . Primary insomnia 04/13/2016   Last Assessment & Plan:  Relevant Hx: Course: Daily Update: Today's Plan:this has been more difficult with her working her third shift  Electronically signed by: [name], NP 05/11/16 1433  . Renal cyst, right 01/21/2016  . Swelling 07/02/2017  . 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 ANKLE SURGERY    . BREAST SURGERY    . CARDIOVERSION N/A 03/12/2021   Procedure: CARDIOVERSION;  Surgeon: Acie Fredrickson Wonda Cheng, MD;  Location: [place];  Service: Cardiovascular;  Laterality: N/A;  . CATARACT EXTRACTION    . CHOLECYSTECTOMY    . FOOT SURGERY     right  . SHOULDER SURGERY    . SKIN SURGERY  07/2020  nose  . TUBAL LIGATION      Current Medications: Current Meds  Medication Sig  . ALPRAZolam (XANAX) 0.5 MG tablet Take 0.5 mg by mouth at bedtime.  Marland Kitchen apixaban (ELIQUIS) 5 MG TABS tablet Take 1 tablet (5 mg total) by mouth 2 (two) times daily.  . Fluticasone-Umeclidin-Vilant (TRELEGY ELLIPTA) 100-62.5-25 MCG/INH AEPB Inhale 1 puff into the lungs daily as needed (Shortness of breath).  . furosemide (LASIX) 20 MG tablet TAKE 2 TABLETS IN THE MORNING AND TAKE 1 TABLET IN THE EVENING  . omeprazole (PRILOSEC) 40 MG capsule Take 40 mg by mouth at bedtime.  . potassium chloride SA (KLOR-CON) 20 MEQ tablet Take 1 tablet (20 mEq total) by mouth 2 (two) times daily.  . sacubitril-valsartan (ENTRESTO) 24-26 MG Take 1 tablet by mouth 2 (two) times daily.  . [DISCONTINUED] hydrALAZINE (APRESOLINE) 25 MG tablet Take 0.5 tablets (12.5 mg total) by mouth 3  (three) times daily.     Allergies:   Amlodipine besylate, Buprenorphine hcl, and Morphine and related   Social History   Socioeconomic History  . Marital status: Married    Spouse name: Not on file  . Number of children: Not on file  . Years of education: Not on file  . Highest education level: Not on file  Occupational History  . Not on file  Tobacco Use  . Smoking status: Never Smoker  . Smokeless tobacco: Never Used  Vaping Use  . Vaping Use: Never used  Substance and Sexual Activity  . Alcohol use: No  . Drug use: No  . Sexual activity: Not on file  Other Topics Concern  . Not on file  Social History Narrative   ** Merged History Encounter **       Social Determinants of Health   Financial Resource Strain: Not on file  Food Insecurity: Not on file  Transportation Needs: Not on file  Physical Activity: Not on file  Stress: Not on file  Social Connections: Not on file     Family History: The patient's family history includes COPD in her father; Cancer in her mother; Heart failure in her sister; Hypertension in her father and mother. ROS:   Please see the history of present illness.    All other systems reviewed and are negative.  EKGs/Labs/Other Studies Reviewed:    The following studies were reviewed today:  EKG:  EKG ordered today and personally reviewed.  The ekg ordered today demonstrates atrial fibrillation controlled ventricular rate incomplete left bundle branch block QRS duration 114 ms  Recent Labs: 03/07/2021: BUN 15; Creatinine, Ser 0.99; Hemoglobin 14.7; NT-Pro BNP 1,226; Platelets 268; Potassium 3.9; Sodium 138  Recent Lipid Panel No results found for: CHOL, TRIG, HDL, CHOLHDL, VLDL, LDLCALC, LDLDIRECT  Physical Exam:    VS:  BP 126/70   Pulse 74   Ht 5\' 6"  (1.676 m)   Wt 181 lb 9.6 Barrett (82.4 kg)   SpO2 98%   BMI 29.31 kg/m     Wt Readings from Last 3 Encounters:  05/27/21 181 lb 9.6 Barrett (82.4 kg)  05/01/21 184 lb (83.5 kg)  03/07/21  186 lb (84.4 kg)     GEN: Appears her age well nourished, well developed in no acute distress HEENT: Normal NECK: No JVD; No carotid bruits LYMPHATICS: No lymphadenopathy CARDIAC: Irregular first heart sound is variable , no murmurs, rubs, gallops RESPIRATORY:  Clear to auscultation without rales, wheezing or rhonchi  ABDOMEN: Soft, non-tender, non-distended MUSCULOSKELETAL:  No edema; No deformity  SKIN: Warm and dry NEUROLOGIC:  Alert and oriented x 3 PSYCHIATRIC:  Normal affect    Signed, Shirlee More, MD  05/27/2021 9:41 AM    [place]

## 2021-06-16 ENCOUNTER — Telehealth: Payer: Self-pay | Admitting: Cardiology

## 2021-06-16 NOTE — Telephone Encounter (Signed)
Spoke to patient informed her that we do not have samples of entresto and we are not supposed give maintenance samples. Patient states paperwork is supposed to be being sent to Dr. Bettina Gavia from her insurance company to fill out. His RN as of earlier had not received yet. Patient asking this been filled out and sent back as soon as possible. She will get a few day supply from pharmacy for now until hopefully the paperwork is filled out from Dr. Bettina Gavia and hopefully approved. Advised her to let me know if she needs anything further.

## 2021-06-16 NOTE — Telephone Encounter (Signed)
Patient calling the office for samples of medication:   1.  What medication and dosage are you requesting samples for? sacubitril-valsartan (ENTRESTO) 24-26 MG apixaban (ELIQUIS) 5 MG TABS tablet  2.  Are you currently out of this medication? Pt only has a few pill left

## 2021-06-16 NOTE — Telephone Encounter (Signed)
Pt is calling in regards to pharmacy assistance from [facility] about a tier 2 medicine Delene Loll

## 2021-06-16 NOTE — Telephone Encounter (Signed)
Spoke to the patient just now and she let me know that she applied for the "prescription D plan" through her [employer] Rossetti and was approved. She states that they were going to send Dr. Bettina Gavia a fax for more information on why she is taking Entresto.   I advised that we have not received anything from them as of yet but that I will keep an eye out for it. She verbalizes understanding.

## 2021-06-17 NOTE — Telephone Encounter (Signed)
Follow up:    Patient calling to speak with a nurse caoncering her last message.

## 2021-06-17 NOTE — Telephone Encounter (Signed)
Spoke to the patient just now and let her know that I have not received anything from her insurance company in regards to her care. I gave her our fax number and she is going to call them back to have them fax it again.

## 2021-06-30 ENCOUNTER — Other Ambulatory Visit: Payer: Self-pay

## 2021-06-30 ENCOUNTER — Encounter: Payer: Self-pay | Admitting: Cardiology

## 2021-06-30 ENCOUNTER — Ambulatory Visit: Payer: Medicare HMO | Admitting: Cardiology

## 2021-06-30 VITALS — BP 159/90 | HR 68 | Ht 66.0 in | Wt 180.8 lb

## 2021-06-30 DIAGNOSIS — I4819 Other persistent atrial fibrillation: Secondary | ICD-10-CM | POA: Diagnosis not present

## 2021-06-30 NOTE — Patient Instructions (Signed)
Medication Instructions:  Your physician recommends that you continue on your current medications as directed. Please refer to the Current Medication list given to you today.  *If you need a refill on your cardiac medications before your next appointment, please call your pharmacy*   Lab Work: Pre procedure labs between _________________  for : BMET & CBC You do NOT need to be fasting.  You can stop by the [facility] office anytime during business hours (avoid lunch time hours)  If you have labs (blood work) drawn today and your tests are completely normal, you will receive your results only by: [place] (if you have MyChart) OR A paper copy in the mail If you have any lab test that is abnormal or we need to change your treatment, we will call you to review the results.   Testing/Procedures: Your physician has requested that you have cardiac CT. Cardiac computed tomography (CT) is a painless test that uses an x-ray machine to take clear, detailed pictures of your heart. Please follow instructions below under "other instructions".  Your physician has recommended that you have an ablation. Catheter ablation is a medical procedure used to treat some cardiac arrhythmias (irregular heartbeats). During catheter ablation, a long, thin, flexible tube is put into a blood vessel in your groin (upper thigh), or neck. This tube is called an ablation catheter. It is then guided to your heart through the blood vessel. Radio frequency waves destroy small areas of heart tissue where abnormal heartbeats may cause an arrhythmia to start. Please follow instructions below under "other instructions".    Follow-Up: At [facility], you and your health needs are our priority.  As part of our continuing mission to provide you with exceptional heart care, we have created designated Provider Care Teams.  These Care Teams include your primary Cardiologist (physician) and Advanced Practice Providers (APPs -   Physician Assistants and Nurse Practitioners) who all work together to provide you with the care you need, when you need it.  Your next appointment:   4 week(s) after your ablation  The format for your next appointment:   In Person  Provider:   Allegra Lai, MD    Thank you for choosing [facility]!!   Trinidad Curet, RN [phone]   Other Instructions  CT INSTRUCTIONS Your cardiac CT will be scheduled at:  [facility] [address] [place], [place] [address] [phone]  Please arrive at the [facility] main entrance of [facility] 30 minutes prior to test start time. Proceed to the [facility] Radiology Department (first floor) to check-in and test prep.   Please follow these instructions carefully (unless otherwise directed):  On the Night Before the Test: Be sure to Drink plenty of water. Do not consume any caffeinated/decaffeinated beverages or chocolate 12 hours prior to your test. Do not take any antihistamines 12 hours prior to your test.  On the Day of the Test: Drink plenty of water until 1 hour prior to the test. Do not eat any food 4 hours prior to the test. You may take your regular medications prior to the test.  Take metoprolol (Lopressor) two hours prior to test. HOLD Furosemide/Hydrochlorothiazide morning of the test. FEMALES- please wear underwire-free bra if available       After the Test: Drink plenty of water. After receiving IV contrast, you may experience a mild flushed feeling. This is normal. On occasion, you may experience a mild rash up to 24 hours after the  test. This is not dangerous. If this occurs, you can take Benadryl 25 mg and increase your fluid intake. If you experience trouble breathing, this can be serious. If it is severe call 911 IMMEDIATELY. If it is mild, please call our office. If you take any of these medications: Glipizide/Metformin, Avandament, Glucavance, please do not take 48 hours after  completing test unless otherwise instructed.   Once we have confirmed authorization from your insurance company, we will call you to set up a date and time for your test. Based on how quickly your insurance processes prior authorizations requests, please allow up to 4 weeks to be contacted for scheduling your Cardiac CT appointment. Be advised that routine Cardiac CT appointments could be scheduled as many as 8 weeks after your provider has ordered it.  For non-scheduling related questions, please contact the cardiac imaging nurse navigator should you have any questions/concerns: Marchia Bond, Cardiac Imaging Nurse Navigator Gordy Clement, Cardiac Imaging Nurse Navigator [facility] Direct Office Dial: [phone]   For scheduling needs, including cancellations and rescheduling, please call [place], [phone].       Electrophysiology/Ablation Procedure Instructions   You are scheduled for a(n)  ablation on 08/15/2021 with Dr. Allegra Lai.   1.   Pre procedure testing-             A.  LAB WORK --- On ____________  for your pre procedure blood work.  You do NOT need to be fasting.   PROCEDURE DAY: On the day of your procedure 08/15/2021 you will go to [facility] [phone]) at 5:30 am.  Dennis Bast will go to the main entrance A [employer]) and enter where the [employer] are.  Your driver will drop you off and you will head down the hallway to ADMITTING.  You may have one support person come in to the hospital with you.  They will be asked to wait in the waiting room.  It is OK to have someone drop you off and come back when you are ready to be discharged.   3.   Do not eat or drink after midnight prior to your procedure.   4.   Do not miss any doses of your blood thinner prior to the morning of your procedure or your procedure will need to be rescheduled.       Do NOT take any medications the morning of your procedure.   5.  Plan for  an overnight stay, but you may be discharged home after your procedure. If you use your phone frequently bring your phone charger, in case you have to stay.  If you are discharged after your procedure you will need someone to drive you home and be with your for 24 hours after your procedure.   6. You will follow up with the AFIB clinic 4 weeks after your procedure.  You will follow up with Dr. Curt Bears  3 months after your procedure.  These appointments will be made for you.  7. FYI: For your safety, and to allow [place] to monitor your vital signs accurately during the surgery/procedure we request that if you have artificial nails, gel coating, SNS etc. Please have those removed prior to your surgery/procedure. Not having the nail coverings /polish removed may result in cancellation or delay of your surgery/procedure.   * If you have ANY questions please call the office [phone] and ask for Polette Nofsinger RN or send me a MyChart message   *  Occasionally, EP Studies and ablations can become lengthy.  Please make your family aware of this before your procedure starts.  Average time ranges from 2-8 hours for EP studies/ablations.  Your physician will call your family after the procedure with the results.                                    Cardiac Ablation Cardiac ablation is a procedure to destroy (ablate) some heart tissue that is sending bad signals. These bad signals causeproblems in heart rhythm. The heart has many areas that make these signals. If there are problems in these areas, they can make the heart beat in a way that is not normal.Destroying some tissues can help make the heart rhythm normal. Tell your doctor about: Any allergies you have. All medicines you are taking. These include vitamins, herbs, eye drops, creams, and over-the-counter medicines. Any problems you or family members have had with medicines that make you fall asleep (anesthetics). Any blood disorders you have. Any surgeries you  have had. Any medical conditions you have, such as kidney failure. Whether you are pregnant or may be pregnant. What are the risks? This is a safe procedure. But problems may occur, including: Infection. Bruising and bleeding. Bleeding into the chest. Stroke or blood clots. Damage to nearby areas of your body. Allergies to medicines or dyes. The need for a pacemaker if the normal system is damaged. Failure of the procedure to treat the problem. What happens before the procedure? Medicines Ask your doctor about: Changing or stopping your normal medicines. This is important. Taking aspirin and ibuprofen. Do not take these medicines unless your doctor tells you to take them. Taking other medicines, vitamins, herbs, and supplements. General instructions Follow instructions from your doctor about what you cannot eat or drink. Plan to have someone take you home from the hospital or clinic. If you will be going home right after the procedure, plan to have someone with you for 24 hours. Ask your doctor what steps will be taken to prevent infection. What happens during the procedure?  An IV tube will be put into one of your veins. You will be given a medicine to help you relax. The skin on your neck or groin will be numbed. A cut (incision) will be made in your neck or groin. A needle will be put through your cut and into a large vein. A tube (catheter) will be put into the needle. The tube will be moved to your heart. Dye may be put through the tube. This helps your doctor see your heart. Small devices (electrodes) on the tube will send out signals. A type of energy will be used to destroy some heart tissue. The tube will be taken out. Pressure will be held on your cut. This helps stop bleeding. A bandage will be put over your cut. The exact procedure may vary among doctors and hospitals. What happens after the procedure? You will be watched until you leave the hospital or clinic. This  includes checking your heart rate, breathing rate, oxygen, and blood pressure. Your cut will be watched for bleeding. You will need to lie still for a few hours. Do not drive for 24 hours or as long as your doctor tells you. Summary Cardiac ablation is a procedure to destroy some heart tissue. This is done to treat heart rhythm problems. Tell your doctor about any medical conditions you may  have. Tell him or her about all medicines you are taking to treat them. This is a safe procedure. But problems may occur. These include infection, bruising, bleeding, and damage to nearby areas of your body. Follow what your doctor tells you about food and drink. You may also be told to change or stop some of your medicines. After the procedure, do not drive for 24 hours or as long as your doctor tells you. This information is not intended to replace advice given to you by your health care provider. Make sure you discuss any questions you have with your healthcare provider. Document Revised: 11/09/2019 Document Reviewed: 11/09/2019 Elsevier Patient Education  2022 [employer].

## 2021-06-30 NOTE — Progress Notes (Signed)
Electrophysiology Office Note   Date:  06/30/2021   ID:  [name], [name] 06-Jan-1946, MRN [id number]  PCP:  [name], MD  Cardiologist: Briana Gavia Primary Electrophysiologist:  Briana Tebbetts Meredith Leeds, MD    Chief Complaint: Atrial fibrillation   History of Present Illness: Briana Barrett is a 75 y.o. female who is being seen today for the evaluation of atrial fibrillation at the request of Briana Gavia Hilton Cork, MD. Presenting today for electrophysiology evaluation.  She has a history significant for persistent atrial fibrillation, hypertension, hyperlipidemia, type 2 diabetes, coronary artery calcifications and mild chronic systolic heart failure.  She had a cardioversion 03/12/2021, but unfortunately went back into atrial fibrillation.  Today, she denies symptoms of palpitations, chest pain, orthopnea, PND, lower extremity edema, claudication, dizziness, presyncope, syncope, bleeding, or neurologic sequela. The patient is tolerating medications without difficulties.  She continues to have episodes of fatigue and weakness as well as shortness of breath.  After cardioversion, she was diagnosed with COVID, and she feels that this is potentially what caused her to go back into atrial fibrillation.  She states that she has difficulty with her daily tasks.  She gets tired when doing the dishes, watering her garden, and washing her hair.   Past Medical History:  Diagnosis Date   Age-related osteoporosis without current pathological fracture 01/21/2016   Last Assessment & Plan:  Relevant Hx: Course: Daily Update: Today's Plan:she feels this is stable for her   Electronically signed by: Briana Camel, NP 05/11/16 1430   Allergic rhinitis 01/21/2016   Last Assessment & Plan:  Relevant Hx: Course: Daily Update: Today's Plan:this is stable for her  Electronically signed by: Briana Camel, NP 05/11/16 1427   Anemia, iron deficiency 01/21/2016   Last Assessment &  Plan:  Her last iron level was 68 and she is taking the iron daily see her CBC   Anxiety 04/13/2016   Last Assessment & Plan:  She is taking the xanax more daily and not her zoloft and she thinks it helps her more   Atrial fibrillation ([place])    Bradycardia 07/02/2017   Calcification of lung 01/21/2016   Coronary artery calcification seen on CT scan 01/21/2016   DDD (degenerative disc disease), lumbosacral 01/21/2016   Last Assessment & Plan:  Relevant Hx: Course: Daily Update: Today's Plan:she is working again at third shift at the [employer] and she is on her feet and that is making this worse for her  Electronically signed by: Briana Camel, NP 05/11/16 1429   Diabetes mellitus type 2, controlled ([place]) 01/21/2016   Last Assessment & Plan:  She does not check her sugar as her last average was good for her    Dyslipidemia 07/23/2016   Encounter for long-term (current) use of high-risk medication 01/21/2016   Episodic lightheadedness 07/02/2017   Gastroesophageal reflux disease without esophagitis 01/21/2016   Hiatal hernia 01/21/2016   Described as large on chest x-ray 2019   History of compression fracture of spine 01/21/2016   Hypercholesteremia    Hypertension    Hypertension, essential 08/27/2015   Last Assessment & Plan:  Her BP readings that she brings in here are up and down, she has a pending appt with cardiology to FU on this, and she has an extreme amount of stress at home as well that is contributing to this . She and I talked about her BP meds and she is taking a low dose of the clonidine but at this  time she wants to monitor this, she is aware of how to take the losartan and is taki   Impaired functional mobility, balance, gait, and endurance 10/19/2018   Localized edema 01/21/2016   Malaise and fatigue 01/21/2016   Last Assessment & Plan:  I really feel her S/S are tied to her BP and the heart rate and it not ideally being controlled for her with her inability to take multiple  meds due to her S/e, she is frustrated with this and she stopped her amiodarone last Thursday, and she did not have any episodes since august 4-5, but is taking coreg as directed   Malignant neoplasm of right breast ([place]) 01/21/2016   Mixed hyperlipidemia 01/21/2016   Last Assessment & Plan:  Relevant Hx: Course: Daily Update: Today's Plan:update this for her fasting  Electronically signed by: Briana Camel, NP 05/11/16 1432   Moderate recurrent major depression ([place]) 01/21/2016   Last Assessment & Plan:  Relevant Hx: Course: Daily Update: Today's Plan:this has been stable for her  Electronically signed by: Briana Camel, NP 05/11/16 1432   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[place]) 09/13/2015   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   Last Assessment & Plan:  Her last TSH was normal   Osteoarthritis, generalized 01/21/2016   Last Assessment & Plan:  Relevant Hx: Course: Daily Update: Today's Plan:this is stable for her at this time  Electronically signed by: Briana Camel, NP 05/11/16 1430   Ovarian cyst, right 01/21/2016   Paroxysmal atrial flutter ([place]) 07/23/2016   Overview:  Chads score equals 1, prefers aspirin only Overview:  Overview:  Chads score equals 1, prefers aspirin only   Plantar fasciitis    Primary insomnia 04/13/2016   Last Assessment & Plan:  Relevant Hx: Course: Daily Update: Today's Plan:this has been more difficult with her working her third shift  Electronically signed by: Briana Camel, NP 05/11/16 1433   Renal cyst, right 01/21/2016   Swelling 07/02/2017   Thoracic degenerative disc disease 06/16/2018   Past Surgical History:  Procedure Laterality Date   ANKLE SURGERY     BREAST SURGERY     CARDIOVERSION N/A 03/12/2021   Procedure: CARDIOVERSION;  Surgeon: Nahser, Wonda Cheng, MD;  Location: [facility];  Service: Cardiovascular;  Laterality: N/A;   CATARACT EXTRACTION     CHOLECYSTECTOMY     FOOT SURGERY     right   SHOULDER SURGERY     SKIN SURGERY   07/2020   nose   TUBAL LIGATION       Current Outpatient Medications  Medication Sig Dispense Refill   ALPRAZolam (XANAX) 0.5 MG tablet Take 0.5 mg by mouth at bedtime.     apixaban (ELIQUIS) 5 MG TABS tablet Take 1 tablet (5 mg total) by mouth 2 (two) times daily. 60 tablet 3   furosemide (LASIX) 20 MG tablet Take 1 tablet (20 mg total) by mouth 2 (two) times daily. 180 tablet 3   omeprazole (PRILOSEC) 40 MG capsule Take 40 mg by mouth at bedtime.     potassium chloride SA (KLOR-CON) 20 MEQ tablet Take 1 tablet (20 mEq total) by mouth 2 (two) times daily. 180 tablet 2   sacubitril-valsartan (ENTRESTO) 24-26 MG Take 1 tablet by mouth 2 (two) times daily. 60 tablet 3   Fluticasone-Umeclidin-Vilant (TRELEGY ELLIPTA) 100-62.5-25 MCG/INH AEPB Inhale 1 puff into the lungs daily as needed (Shortness of breath). (Patient not taking: Reported on 06/30/2021)     No current facility-administered medications for this visit.  Allergies:   Amlodipine besylate, Buprenorphine hcl, and Morphine and related   Social History:  The patient  reports that she has never smoked. She has never used smokeless tobacco. She reports that she does not drink alcohol and does not use drugs.   Family History:  The patient's family history includes COPD in her father; Cancer in her mother; Heart failure in her sister; Hypertension in her father and mother.    ROS:  Please see the history of present illness.   Otherwise, review of systems is positive for none.   All other systems are reviewed and negative.    PHYSICAL EXAM: VS:  BP (!) 159/90   Pulse 68   Ht 5\' 6"  (1.676 m)   Wt 180 lb 12.8 oz (82 kg)   SpO2 95%   BMI 29.18 kg/m  , BMI Body mass index is 29.18 kg/m. GEN: Well nourished, well developed, in no acute distress  HEENT: normal  Neck: no JVD, carotid bruits, or masses Cardiac: Irregular; no murmurs, rubs, or gallops,no edema  Respiratory:  clear to auscultation bilaterally, normal work of  breathing GI: soft, nontender, nondistended, + BS MS: no deformity or atrophy  Skin: warm and dry Neuro:  Strength and sensation are intact Psych: euthymic mood, full affect  EKG:  EKG is ordered today. Personal review of the ekg ordered shows atrial fibrillation, rate 68, septal Q waves  Recent Labs: 03/07/2021: BUN 15; Creatinine, Ser 0.99; Hemoglobin 14.7; NT-Pro BNP 1,226; Platelets 268; Potassium 3.9; Sodium 138    Lipid Panel  No results found for: CHOL, TRIG, HDL, CHOLHDL, VLDL, LDLCALC, LDLDIRECT   Wt Readings from Last 3 Encounters:  06/30/21 180 lb 12.8 oz (82 kg)  05/27/21 181 lb 9.6 oz (82.4 kg)  05/01/21 184 lb (83.5 kg)      Other studies Reviewed: Additional studies/ records that were reviewed today include: TTE 08/19/20  Review of the above records today demonstrates:   1. Left ventricular ejection fraction, by estimation, is 40 to 45%. The  left ventricle has mildly decreased function. The left ventricle  demonstrates global hypokinesis. There is mild concentric left ventricular  hypertrophy. Left ventricular diastolic  parameters are consistent with Grade II diastolic dysfunction  (pseudonormalization).   2. Abnormal left ventricular global longitudinal strain ( -7.5 %).   3. Right ventricular systolic function is normal. The right ventricular  size is normal. There is normal pulmonary artery systolic pressure.   4. Left atrial size was severely dilated.   5. The mitral valve is normal in structure. Trivial mitral valve  regurgitation. No evidence of mitral stenosis.   6. The aortic valve is normal in structure. Aortic valve regurgitation is  not visualized. Mild aortic valve sclerosis is present, with no evidence  of aortic valve stenosis.   7. There is mild dilatation of the ascending aorta measuring 36 mm.   8. The inferior vena cava is normal in size with greater than 50%  respiratory variability, suggesting right atrial pressure of 3 mmHg.  Cardiac  monitor 10/09/2020 personally reviewed persistent atrial fibrillation with good heart rate control.  Although ventricular ectopy was rare the triggered and diary events are associated with isolated PVCs.  ASSESSMENT AND PLAN:  1.  Persistent atrial fibrillation: Currently on Eliquis.  CHA2DS2-VASc of 6.  She has had a cardioversion, but unfortunately went back into atrial fibrillation.  At this point, she would like a rhythm control strategy as she feels quite poorly in atrial fibrillation.  Due  to that, Briana Barrett plan for ablation.  Risk, benefits, and alternatives to EP study and radiofrequency ablation for afib were also discussed in detail today. These risks include but are not limited to stroke, bleeding, vascular damage, tamponade, perforation, damage to the esophagus, lungs, and other structures, pulmonary vein stenosis, worsening renal function, and death. The patient understands these risk and wishes to proceed.  We Briana Barrett therefore proceed with catheter ablation at the next available time.  Carto, ICE, anesthesia are requested for the procedure.  Briana Barrett also obtain CT PV protocol prior to the procedure to exclude LAA thrombus and further evaluate atrial anatomy.  2.  Mild chronic systolic heart failure: Currently on Entresto.  She is having trouble affording Entresto.  We Edrian Melucci switch her to losartan and refer her to pharmacy for assistance.  Hopefully she can get back on Entresto.  Case discussed with primary cardiology  Current medicines are reviewed at length with the patient today.   The patient does not have concerns regarding her medicines.  The following changes were made today: Stop Entresto, start losartan  Labs/ tests ordered today include:  Orders Placed This Encounter  Procedures   EKG 12-Lead     Disposition:   FU with Elim Peale 3 months  Signed, Baruc Tugwell Meredith Leeds, MD  06/30/2021 1:45 PM     [place]  [id number] [phone] (office) [phone] (fax)

## 2021-07-28 ENCOUNTER — Telehealth: Payer: Self-pay | Admitting: Licensed Clinical Social Worker

## 2021-07-28 ENCOUNTER — Telehealth: Payer: Self-pay | Admitting: Cardiology

## 2021-07-28 DIAGNOSIS — I4891 Unspecified atrial fibrillation: Secondary | ICD-10-CM

## 2021-07-28 MED ORDER — LOSARTAN POTASSIUM 100 MG PO TABS: 100.0000 mg | ORAL_TABLET | Freq: Every day | ORAL | 3 refills | Status: DC

## 2021-07-28 MED ORDER — METOPROLOL TARTRATE 50 MG PO TABS
ORAL_TABLET | ORAL | 0 refills | Status: DC
Start: 2021-07-28 — End: 2021-08-15

## 2021-07-28 NOTE — Progress Notes (Signed)
Heart and Vascular Care Navigation  07/28/2021  Delilia Teaff 1946/10/20 [id number]  Reason for Referral:  Engaged with patient by telephone for initial visit for Heart and Vascular Care Coordination.                                                                                                   Assessment:         LCSW received referral from Sonia Baller, Therapist, sports at [facility]. She shares pt expressed concerns about financial strain caused by ongoing medical work up. LCSW called and was able to reach pt at her cell phone [phone]. Introduced self, role, reason for call. Pt confirmed home address, PCP and that her daughter and granddaughter remain her emergency contacts. Pt shares her husband passed last year and making ends meet on one income has been difficult; she receives SSI and a small pension ($52/mo).  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 She had received SNAP benefits but they have been reduced to only about $20/mo. Her regular expenses include utilities, mobile home insurance, car insurance and other costs of living. When she had her previous cardiac work up she did a payment plan for that bill but worries about adding additional bills on top of that and being able to make ends meet. LCSW discussed that we have a Patient Care Fund that may be able to help with her bills to free up money for the anticipated bills. Pt hesistant at first but then open to assistance with her electric bill and with her mobile home insurance.  We also discussed the cost of medications, she had applied for Medicaid after her husband passed but was denied. She had spoken with the [facility] program at [employer] in [place]. They were able to assist her with getting her costs of some medications lowered. She is not sure if they completed an Extra Help application with her and she may be eligible for that [place] assistance. LCSW  completed application with her on the phone, encouraged her to f/u with me or [facility] counselors if she receives any additional information.                               HRT/VAS Care Coordination     Patients Home Cardiology Office [facility]   Outpatient Care Team Social Worker   Social Worker Name: [name], LCSW, [facility]   Living arrangements for the past 2 months Mobile Home   Lives with: Self   Patient Current Insurance Coverage Managed Medicare   Patient Has Concern With [place] Yes   Patient Concerns With Medical Bills on payment plan, ongoing medical work up   Medical Bill Referrals: [facility], assistance from [facility] [place] to utilize income for outstanding bills   Does Patient Have Prescription Coverage? Yes   Patient Prescription Assistance Programs Other   Other Assistance Programs Medications general copay assistance  Social History:                                                                             SDOH Screenings   Alcohol Screen: Not on file  Depression (PHQ2-9): Not on file  Financial Resource Strain: High Risk   Difficulty of Paying Living Expenses: Hard  Food Insecurity: Food Insecurity Present   Worried About Charity fundraiser in the Last Year: Sometimes true   Ran Out of Food in the Last Year: Sometimes true  Housing: Low Risk    Last Housing Risk Score: 0  Physical Activity: Not on file  Social Connections: Not on file  Stress: Stress Concern Present   Feeling of Stress : Rather much  Tobacco Use: Low Risk    Smoking Tobacco Use: Never   Smokeless Tobacco Use: Never  Transportation Needs: No Transportation Needs   Lack of Transportation (Medical): No   Lack of Transportation (Non-Medical): No    SDOH Interventions: Financial Resources:  Financial Strain Interventions: Other (Comment) (patient care fund referral, Extra Help application submitted, connection to [employer])  Food  Insecurity:  Food Insecurity Interventions: Other (Comment) [facility] food assistance; pt recieves $20 in [employer])  Housing Insecurity:  Housing Interventions: Intervention Not Indicated  Transportation:   Transportation Interventions: Intervention Not Indicated     Other Care Navigation Interventions:     Provided Pharmacy assistance resources Other- extra help application  Patient expressed Mental Health concerns Yes, Referred to:  provided support, encouraged pt to reach out if interested in mental health/grief support   Follow-up plan:   LCSW has contacted both [employer] and [employer] to see if we could assist with pt Warehouse manager and mobile home [employer]. Pt will need to bring in bills to [facility] and will attempt to coordinate that with her tomorrow. I have mailed pt my card and a copy of the Extra Help application along with Medicare helpline number to check on status. Encouraged pt to reach out to me if we could be of any additional assistance.

## 2021-07-28 NOTE — Telephone Encounter (Signed)
Pt c/o medication issue:  1. Name of Medication:  Losartan   2. How are you currently taking this medication (dosage and times per day)?  Patient hasn't started  3. Are you having a reaction (difficulty breathing--STAT)?  No   4. What is your medication issue?  Patient is requesting to speak with Dr. Curt Bears' nurse. She states in the past she discussed starting Losartan after completing Entresto samples. She states she is ready to start taking Losartan.

## 2021-07-28 NOTE — Telephone Encounter (Signed)
Orders entered for lab work, cardiac CT  Ablation scheduled for 08/15/2021  Message sent to precert and [place] to schedule CT  Per pharmacy-Pt will switch from Entresto to losartan 100 mg daily per last note (WC).   Message sent to carto.  Orders entered.  Pt called and went over all instructions.  She will get lab work this week.  Advised she would get call from [place] to schedule cardiac CT.  Confirmed instructions/order for metoprolol sent.  Work up complete for afib ablation

## 2021-07-28 NOTE — Telephone Encounter (Signed)
Returned call to patient who states she has 2 weeks of Entresto samples left and was advised to call back to get a prescription for Losartan. She has contacted PA for [facility] and cannot afford the cost. Per Dr. Macky Lower note from 7/11, she was advised to start Losartan at the end of the [facility] trial if she could not afford it. His note does not indicate the dose of Losartan, so I will reach out to pharmacy for advice as Dr. Curt Bears is out of the office all week. Patient states she is scheduled for a fib ablation on Aug. 26 and is supposed to get a cardiac CT prior. I reviewed patient's chart and do not see an order for the CT, however patient was given instructions for this procedure. I have asked Willeen Cass, RN for assistance with getting this procedure scheduled as she has more experience with EP procedures.

## 2021-07-29 DIAGNOSIS — N029 Recurrent and persistent hematuria with unspecified morphologic changes: Secondary | ICD-10-CM

## 2021-07-29 HISTORY — DX: Recurrent and persistent hematuria with unspecified morphologic changes: N02.9

## 2021-07-30 LAB — BASIC METABOLIC PANEL
BUN/Creatinine Ratio: 14 (ref 12–28)
BUN: 14 mg/dL (ref 8–27)
CO2: 24 mmol/L (ref 20–29)
Calcium: 8.9 mg/dL (ref 8.7–10.3)
Chloride: 106 mmol/L (ref 96–106)
Creatinine, Ser: 1.01 mg/dL — ABNORMAL HIGH (ref 0.57–1.00)
Glucose: 117 mg/dL — ABNORMAL HIGH (ref 65–99)
Potassium: 4 mmol/L (ref 3.5–5.2)
Sodium: 145 mmol/L — ABNORMAL HIGH (ref 134–144)
eGFR: 57 mL/min/{1.73_m2} — ABNORMAL LOW (ref >59)

## 2021-07-30 LAB — CBC WITH DIFFERENTIAL/PLATELET
Basophils Absolute: 0 10*3/uL (ref 0.0–0.2)
Basos: 1 %
EOS (ABSOLUTE): 0.1 10*3/uL (ref 0.0–0.4)
Eos: 2 %
Hematocrit: 44.9 % (ref 34.0–46.6)
Hemoglobin: 14.1 g/dL (ref 11.1–15.9)
Immature Grans (Abs): 0 10*3/uL (ref 0.0–0.1)
Immature Granulocytes: 0 %
Lymphocytes Absolute: 1.9 10*3/uL (ref 0.7–3.1)
Lymphs: 35 %
MCH: 26.1 pg — ABNORMAL LOW (ref 26.6–33.0)
MCHC: 31.4 g/dL — ABNORMAL LOW (ref 31.5–35.7)
MCV: 83 fL (ref 79–97)
Monocytes Absolute: 0.5 10*3/uL (ref 0.1–0.9)
Monocytes: 9 %
Neutrophils Absolute: 2.9 10*3/uL (ref 1.4–7.0)
Neutrophils: 53 %
Platelets: 262 10*3/uL (ref 150–450)
RBC: 5.41 x10E6/uL — ABNORMAL HIGH (ref 3.77–5.28)
RDW: 13.2 % (ref 11.7–15.4)
WBC: 5.5 10*3/uL (ref 3.4–10.8)

## 2021-07-31 ENCOUNTER — Telehealth: Payer: Self-pay | Admitting: Licensed Clinical Social Worker

## 2021-07-31 NOTE — Telephone Encounter (Signed)
I was able to speak with [facility] this morning [phone]) regarding pt mobile home insurance [phone])- we were able to assist through [facility] [place]. I have spoken with team lead Tressia Danas, regarding additional assistance w/ electric bill- we will also be able to provide assistance w/ this. I have received W9 from [employer], need copy of bill.   I was able to call and speak with pt about above- she will bring bill or have family member bring by today. Johnna, front desk representative is aware and will scan it to me. Once received I will sent to [facility] [place] for request.   3:00pm- received bill, have submitted to [facility] [place] via team lead Kennyth Lose.   [name], MSW, [place]  [phone]

## 2021-08-06 ENCOUNTER — Telehealth: Payer: Self-pay | Admitting: Licensed Clinical Social Worker

## 2021-08-06 NOTE — Telephone Encounter (Signed)
Pt called this Probation officer for update regarding her assistance for electric, nervous it will be late. I shared with her that the check is ready today and we will put it in the mail on her behalf this afternoon.  Pt shares appreciation for assistance. She has had a tough week so far. She thinks that she is not going to be able to continue in her current job as they require standing during work that puts stress on a "herniated disc I've had for awhile." She has looked into the alternate employment that [employer] provided her but she is not sure that she would be able to tolerate that as well. Pt also supporting her grandchildren emotionally this week as their father passed away last week.   All of these various stressors make her even more nervous for her upcoming visits next week. LCSW provided brief counseling and support that pt is doing the best that she can with everything on her plate. I encouraged her to reach out to me if she needed to process anything or if any additional concerns arise. Pt made aware I will check in with her next week to see if any additional support would be appreciated. I remain available.   [name], MSW, [place]  [phone]

## 2021-08-08 ENCOUNTER — Telehealth (HOSPITAL_COMMUNITY): Payer: Self-pay | Admitting: Emergency Medicine

## 2021-08-08 NOTE — Telephone Encounter (Signed)
Reaching out to patient to offer assistance regarding upcoming cardiac imaging study; pt verbalizes understanding of appt date/time, parking situation and where to check in, pre-test NPO status and medications ordered, and verified current allergies; name and call back number provided for further questions should they arise Marchia Bond RN Navigator Cardiac Imaging [facility] [phone] office [phone] cell  L ARM ONLY  [place]- has xanax? Left decision up to patient Pt instructed to take 1/2 of her '50mg'$  metoprolol

## 2021-08-11 ENCOUNTER — Ambulatory Visit (HOSPITAL_COMMUNITY)
Admission: RE | Admit: 2021-08-11 | Discharge: 2021-08-11 | Disposition: A | Discharge status: Home / Self Care | Payer: Medicare HMO | Source: Ambulatory Visit | Attending: Cardiology | Admitting: Cardiology

## 2021-08-11 ENCOUNTER — Other Ambulatory Visit: Payer: Self-pay

## 2021-08-11 DIAGNOSIS — I4891 Unspecified atrial fibrillation: Secondary | ICD-10-CM | POA: Insufficient documentation

## 2021-08-11 MED ORDER — IOHEXOL 350 MG/ML SOLN
95.0000 mL | Freq: Once | INTRAVENOUS | Status: AC | PRN
  Administered 2021-08-11: 95 mL via INTRAVENOUS

## 2021-08-14 ENCOUNTER — Telehealth: Payer: Self-pay | Admitting: Cardiology

## 2021-08-14 NOTE — Telephone Encounter (Signed)
Called patient and reviewed the following:  On the day of your procedure 08/15/2021 you will go to [facility] [phone]) at 5:30 am.  Dennis Bast will go to the main entrance [address] [employer]) and enter where the [employer] are.  Your driver will drop you off and you will head down the hallway to ADMITTING.  You may have one support person come in to the hospital with you.  They will be asked to wait in the waiting room.  It is OK to have someone drop you off and come back when you are ready to be discharged.   3.   Do not eat or drink after midnight prior to your procedure.   4.   Do not miss any doses of your blood thinner prior to the morning of your procedure or your procedure will need to be rescheduled.       Do NOT take any medications the morning of your procedure.   5.  Plan for an overnight stay, but you may be discharged home after your procedure. If you use your phone frequently bring your phone charger, in case you have to stay.  If you are discharged after your procedure you will need someone to drive you home and be with your for 24 hours after your procedure.   6. You will follow up with the AFIB clinic 4 weeks after your procedure.  You will follow up with Dr. Curt Bears  3 months after your procedure.  These appointments will be made for you.   7. FYI: For your safety, and to allow [place] to monitor your vital signs accurately during the surgery/procedure we request that if you have artificial nails, gel coating, SNS etc. Please have those removed prior to your surgery/procedure. Not having the nail coverings /polish removed may result in cancellation or delay of your surgery/procedure.  She repeated these instructions to me several times and verbalized understanding. She had no additional needs at this time.

## 2021-08-14 NOTE — Telephone Encounter (Signed)
Pt is having an upcoming procedure 08/15/21. Pt is wanting someone to give her a call so she will know what she needs to to before her procedure. Please advise pt further

## 2021-08-14 NOTE — Anesthesia Preprocedure Evaluation (Addendum)
Anesthesia Evaluation  Patient identified by MRN, date of birth, ID band Patient awake    Reviewed: Allergy & Precautions, NPO status , Patient's Chart, lab work & pertinent test results  Airway Mallampati: II  TM Distance: >3 FB Neck ROM: Full    Dental no notable dental hx. (+) Edentulous Upper, Dental Advisory Given   Pulmonary neg pulmonary ROS,    Pulmonary exam normal breath sounds clear to auscultation       Cardiovascular hypertension, + CAD and +CHF  Normal cardiovascular exam+ dysrhythmias Atrial Fibrillation  Rhythm:Irregular Rate:Normal     Neuro/Psych Anxiety    GI/Hepatic hiatal hernia, GERD  ,  Endo/Other  diabetes  Renal/GU Renal disease     Musculoskeletal  (+) Arthritis ,   Abdominal   Peds  Hematology Lab Results      Component                Value               Date                      WBC                      5.5                 07/29/2021                HGB                      14.1                07/29/2021                HCT                      44.9                07/29/2021                MCV                      83                  07/29/2021                PLT                      262                 07/29/2021              Anesthesia Other Findings All: Amlodipine, morphine, buprenorphine    Reproductive/Obstetrics                            Anesthesia Physical Anesthesia Plan  ASA: 3  Anesthesia Plan: General   Post-op Pain Management:    Induction: Intravenous  PONV Risk Score and Plan: 4 or greater and Treatment may vary due to age or medical condition and Ondansetron  Airway Management Planned: Oral ETT  Additional Equipment: None  Intra-op Plan:   Post-operative Plan: Extubation in OR  Informed Consent: I have reviewed the patients History and Physical, chart, labs and discussed the procedure including the risks, benefits and alternatives  for the proposed anesthesia with the patient  or authorized representative who has indicated his/her understanding and acceptance.     Dental advisory given  Plan Discussed with:   Anesthesia Plan Comments: (GA)       Anesthesia Quick Evaluation

## 2021-08-14 NOTE — Pre-Procedure Instructions (Signed)
Instructed patient on the following items: Arrival time 0530 Nothing to eat or drink after midnight No meds AM of procedure Responsible person to drive you home and stay with you for 24 hrs  Have you missed any doses of anti-coagulant Eliquis- hasn't missed any doses    

## 2021-08-15 ENCOUNTER — Ambulatory Visit (HOSPITAL_COMMUNITY): Payer: Medicare HMO | Admitting: Anesthesiology

## 2021-08-15 ENCOUNTER — Ambulatory Visit (HOSPITAL_COMMUNITY)
Admission: RE | Admit: 2021-08-15 | Discharge: 2021-08-15 | Disposition: A | Discharge status: Home / Self Care | Payer: Medicare HMO | Attending: Cardiology | Admitting: Cardiology

## 2021-08-15 ENCOUNTER — Encounter (HOSPITAL_COMMUNITY): Payer: Self-pay | Admitting: Cardiology

## 2021-08-15 ENCOUNTER — Encounter (HOSPITAL_COMMUNITY)
Admission: RE | Disposition: A | Discharge status: Home / Self Care | Payer: Self-pay | Source: Home / Self Care | Attending: Cardiology

## 2021-08-15 ENCOUNTER — Other Ambulatory Visit: Payer: Self-pay

## 2021-08-15 DIAGNOSIS — I4819 Other persistent atrial fibrillation: Secondary | ICD-10-CM | POA: Insufficient documentation

## 2021-08-15 DIAGNOSIS — Z885 Allergy status to narcotic agent status: Secondary | ICD-10-CM | POA: Diagnosis not present

## 2021-08-15 DIAGNOSIS — E119 Type 2 diabetes mellitus without complications: Secondary | ICD-10-CM | POA: Diagnosis not present

## 2021-08-15 DIAGNOSIS — Z8249 Family history of ischemic heart disease and other diseases of the circulatory system: Secondary | ICD-10-CM | POA: Diagnosis not present

## 2021-08-15 DIAGNOSIS — I5022 Chronic systolic (congestive) heart failure: Secondary | ICD-10-CM | POA: Insufficient documentation

## 2021-08-15 DIAGNOSIS — E782 Mixed hyperlipidemia: Secondary | ICD-10-CM | POA: Diagnosis not present

## 2021-08-15 DIAGNOSIS — Z9049 Acquired absence of other specified parts of digestive tract: Secondary | ICD-10-CM | POA: Insufficient documentation

## 2021-08-15 DIAGNOSIS — I2510 Atherosclerotic heart disease of native coronary artery without angina pectoris: Secondary | ICD-10-CM | POA: Insufficient documentation

## 2021-08-15 DIAGNOSIS — Z853 Personal history of malignant neoplasm of breast: Secondary | ICD-10-CM | POA: Diagnosis not present

## 2021-08-15 DIAGNOSIS — I110 Hypertensive heart disease with heart failure: Secondary | ICD-10-CM | POA: Insufficient documentation

## 2021-08-15 DIAGNOSIS — I4892 Unspecified atrial flutter: Secondary | ICD-10-CM | POA: Diagnosis not present

## 2021-08-15 HISTORY — PX: ATRIAL FIBRILLATION ABLATION: EP1191

## 2021-08-15 SURGERY — ATRIAL FIBRILLATION ABLATION
Anesthesia: General

## 2021-08-15 MED ORDER — ACETAMINOPHEN 325 MG PO TABS
650.0000 mg | ORAL_TABLET | ORAL | Status: DC | PRN
  Administered 2021-08-15: 650 mg via ORAL

## 2021-08-15 MED ORDER — ACETAMINOPHEN 325 MG PO TABS
ORAL_TABLET | ORAL | Status: AC
  Filled 2021-08-15: qty 2

## 2021-08-15 MED ORDER — SODIUM CHLORIDE 0.9 % IV SOLN: 250.0000 mL | INTRAVENOUS | Status: DC | PRN

## 2021-08-15 MED ORDER — FENTANYL CITRATE (PF) 250 MCG/5ML IJ SOLN
INTRAMUSCULAR | Status: DC | PRN
  Administered 2021-08-15: 100 ug via INTRAVENOUS

## 2021-08-15 MED ORDER — SUGAMMADEX SODIUM 200 MG/2ML IV SOLN
INTRAVENOUS | Status: DC | PRN
  Administered 2021-08-15: 150 mg via INTRAVENOUS

## 2021-08-15 MED ORDER — DEXAMETHASONE SODIUM PHOSPHATE 10 MG/ML IJ SOLN
INTRAMUSCULAR | Status: DC | PRN
  Administered 2021-08-15: 4 mg via INTRAVENOUS

## 2021-08-15 MED ORDER — ONDANSETRON HCL 4 MG/2ML IJ SOLN
INTRAMUSCULAR | Status: DC | PRN
  Administered 2021-08-15: 4 mg via INTRAVENOUS

## 2021-08-15 MED ORDER — SODIUM CHLORIDE 0.9% FLUSH: 3.0000 mL | Freq: Two times a day (BID) | INTRAVENOUS | Status: DC

## 2021-08-15 MED ORDER — METOPROLOL TARTRATE 5 MG/5ML IV SOLN
5.0000 mg | Freq: Once | INTRAVENOUS | Status: AC
  Administered 2021-08-15: 5 mg via INTRAVENOUS
  Filled 2021-08-15: qty 5

## 2021-08-15 MED ORDER — HEPARIN (PORCINE) IN NACL 1000-0.9 UT/500ML-% IV SOLN
INTRAVENOUS | Status: AC
  Filled 2021-08-15: qty 2000

## 2021-08-15 MED ORDER — HEPARIN SODIUM (PORCINE) 1000 UNIT/ML IJ SOLN
INTRAMUSCULAR | Status: DC | PRN
  Administered 2021-08-15: 1000 [IU] via INTRAVENOUS

## 2021-08-15 MED ORDER — HEPARIN SODIUM (PORCINE) 1000 UNIT/ML IJ SOLN
INTRAMUSCULAR | Status: DC | PRN
  Administered 2021-08-15: 2000 [IU] via INTRAVENOUS
  Administered 2021-08-15: 15000 [IU] via INTRAVENOUS
  Administered 2021-08-15: 2000 [IU] via INTRAVENOUS

## 2021-08-15 MED ORDER — HEPARIN SODIUM (PORCINE) 1000 UNIT/ML IJ SOLN
INTRAMUSCULAR | Status: AC
  Filled 2021-08-15: qty 1

## 2021-08-15 MED ORDER — ROCURONIUM BROMIDE 10 MG/ML (PF) SYRINGE
PREFILLED_SYRINGE | INTRAVENOUS | Status: DC | PRN
  Administered 2021-08-15: 50 mg via INTRAVENOUS
  Administered 2021-08-15: 20 mg via INTRAVENOUS

## 2021-08-15 MED ORDER — APIXABAN 5 MG PO TABS
5.0000 mg | ORAL_TABLET | Freq: Once | ORAL | Status: AC
  Administered 2021-08-15: 5 mg via ORAL
  Filled 2021-08-15: qty 1

## 2021-08-15 MED ORDER — METOPROLOL TARTRATE 50 MG PO TABS
50.0000 mg | ORAL_TABLET | Freq: Once | ORAL | Status: AC
  Administered 2021-08-15: 50 mg via ORAL
  Filled 2021-08-15: qty 1

## 2021-08-15 MED ORDER — HEPARIN (PORCINE) IN NACL 1000-0.9 UT/500ML-% IV SOLN
INTRAVENOUS | Status: DC | PRN
  Administered 2021-08-15 (×5): 500 mL

## 2021-08-15 MED ORDER — PHENYLEPHRINE 40 MCG/ML (10ML) SYRINGE FOR IV PUSH (FOR BLOOD PRESSURE SUPPORT)
PREFILLED_SYRINGE | INTRAVENOUS | Status: DC | PRN
  Administered 2021-08-15: 120 ug via INTRAVENOUS
  Administered 2021-08-15: 40 ug via INTRAVENOUS
  Administered 2021-08-15 (×3): 80 ug via INTRAVENOUS

## 2021-08-15 MED ORDER — SODIUM CHLORIDE 0.9 % IV SOLN: INTRAVENOUS | Status: DC

## 2021-08-15 MED ORDER — LIDOCAINE 2% (20 MG/ML) 5 ML SYRINGE
INTRAMUSCULAR | Status: DC | PRN
  Administered 2021-08-15: 80 mg via INTRAVENOUS

## 2021-08-15 MED ORDER — METOPROLOL TARTRATE 25 MG PO TABS: 25.0000 mg | ORAL_TABLET | Freq: Two times a day (BID) | ORAL | 11 refills | Status: DC

## 2021-08-15 MED ORDER — PROPOFOL 10 MG/ML IV BOLUS
INTRAVENOUS | Status: DC | PRN
  Administered 2021-08-15: 30 mg via INTRAVENOUS
  Administered 2021-08-15: 140 mg via INTRAVENOUS

## 2021-08-15 MED ORDER — PROTAMINE SULFATE 10 MG/ML IV SOLN
INTRAVENOUS | Status: DC | PRN
  Administered 2021-08-15: 10 mg via INTRAVENOUS

## 2021-08-15 MED ORDER — METOPROLOL SUCCINATE ER 100 MG PO TB24: 100.0000 mg | ORAL_TABLET | Freq: Every day | ORAL | 11 refills | Status: DC

## 2021-08-15 MED ORDER — HEPARIN (PORCINE) IN NACL 1000-0.9 UT/500ML-% IV SOLN
INTRAVENOUS | Status: AC
  Filled 2021-08-15: qty 500

## 2021-08-15 MED ORDER — ONDANSETRON HCL 4 MG/2ML IJ SOLN: 4.0000 mg | Freq: Four times a day (QID) | INTRAMUSCULAR | Status: DC | PRN

## 2021-08-15 MED ORDER — SODIUM CHLORIDE 0.9% FLUSH: 3.0000 mL | INTRAVENOUS | Status: DC | PRN

## 2021-08-15 SURGICAL SUPPLY — 21 items
BAG SNAP BAND KOVER 36X36 (MISCELLANEOUS) ×2 IMPLANT
CATH OCTARAY 2.0 F 3-3-3-3-3 (CATHETERS) ×2 IMPLANT
CATH S CIRCA THERM PROBE 10F (CATHETERS) ×2 IMPLANT
CATH SMTCH THERMOCOOL SF DF (CATHETERS) ×2 IMPLANT
CATH SOUNDSTAR ECO 8FR (CATHETERS) ×2 IMPLANT
CATH WEBSTER BI DIR CS D-F CRV (CATHETERS) ×2 IMPLANT
CLOSURE PERCLOSE PROSTYLE (VASCULAR PRODUCTS) ×8 IMPLANT
COVER SWIFTLINK CONNECTOR (BAG) ×2 IMPLANT
PACK EP LATEX FREE (CUSTOM PROCEDURE TRAY) ×2
PACK EP LF (CUSTOM PROCEDURE TRAY) ×1 IMPLANT
PAD PRO RADIOLUCENT 2001M-C (PAD) ×2 IMPLANT
PATCH CARTO3 (PAD) ×2 IMPLANT
SHEATH BAYLIS SUREFLEX  M 8.5 (SHEATH) ×2
SHEATH BAYLIS SUREFLEX M 8.5 (SHEATH) ×1 IMPLANT
SHEATH BAYLIS TRANSSEPTAL 98CM (NEEDLE) ×2 IMPLANT
SHEATH CARTO VIZIGO SM CVD (SHEATH) ×2 IMPLANT
SHEATH PINNACLE 7F 10CM (SHEATH) ×2 IMPLANT
SHEATH PINNACLE 8F 10CM (SHEATH) ×4 IMPLANT
SHEATH PINNACLE 9F 10CM (SHEATH) ×2 IMPLANT
SHEATH PROBE COVER 6X72 (BAG) ×2 IMPLANT
TUBING SMART ABLATE COOLFLOW (TUBING) ×2 IMPLANT

## 2021-08-15 NOTE — Anesthesia Procedure Notes (Signed)
Procedure Name: Intubation Date/Time: 08/15/2021 7:52 AM Performed by: [name], CRNA Pre-anesthesia Checklist: Patient identified, Emergency Drugs available, Suction available and Patient being monitored Patient Re-evaluated:Patient Re-evaluated prior to induction Oxygen Delivery Method: Circle System Utilized Preoxygenation: Pre-oxygenation with 100% oxygen Induction Type: IV induction Ventilation: Mask ventilation without difficulty Laryngoscope Size: Miller and 2 Grade View: Grade I Tube type: Oral Tube size: 7.0 mm Number of attempts: 1 Airway Equipment and Method: Stylet and Oral airway Placement Confirmation: ETT inserted through vocal cords under direct vision, positive ETCO2 and breath sounds checked- equal and bilateral Secured at: 20 cm Tube secured with: Tape Dental Injury: Teeth and Oropharynx as per pre-operative assessment

## 2021-08-15 NOTE — Anesthesia Postprocedure Evaluation (Signed)
Anesthesia Post Note  Patient: Briana Barrett  Procedure(s) Performed: ATRIAL FIBRILLATION ABLATION     Patient location during evaluation: Cath Lab Anesthesia Type: General Level of consciousness: awake and alert Pain management: pain level controlled Vital Signs Assessment: post-procedure vital signs reviewed and stable Respiratory status: spontaneous breathing, nonlabored ventilation, respiratory function stable and patient connected to nasal cannula oxygen Cardiovascular status: blood pressure returned to baseline and stable Postop Assessment: no apparent nausea or vomiting Anesthetic complications: no   There were no known notable events for this encounter.  Last Vitals:  Vitals:   08/15/21 1225 08/15/21 1240  BP: 136/85 135/88  Pulse: 82 87  Resp: 19 (!) 21  Temp:    SpO2: 90% (!) 88%    Last Pain:  Vitals:   08/15/21 1225  TempSrc:   PainSc: 0-No pain                 Barnet Glasgow

## 2021-08-15 NOTE — Discharge Instructions (Addendum)
Post procedure care instructions No driving for 4 days. No lifting over 5 lbs for 1 week. No vigorous or sexual activity for 1 week. You may return to work/your usual activities on 08/23/21. Keep procedure site clean & dry. If you notice increased pain, swelling, bleeding or pus, call/return!  You may shower after 24 hours, but no soaking in baths/hot tubs/pools for 1 week.    You have an appointment set up with the [facility].  Multiple studies have shown that being followed by a dedicated atrial fibrillation clinic in addition to the standard care you receive from your other physicians improves health. We believe that enrollment in the atrial fibrillation clinic will allow [place] to better care for you.   The phone number to the Mobile Clinic is [phone]. The clinic is staffed Monday through Friday from 8:30am to 5pm.  Parking Directions: The clinic is located in the Heart and Vascular Building connected to [facility]. 1)From [address] turn on to [address] and go to the 3rd entrance  (Heart and Vascular entrance) on the right. 2)Look to the right for Heart &Vascular Parking Garage. 3)A code for the entrance is required, for September is 4455   4)Take the elevators to the 1st floor. Registration is in the room with the glass walls at the end of the hallway.  If you have any trouble parking or locating the clinic, please don't hesitate to call [phone].   Cardiac Ablation, Care After  This sheet gives you information about how to care for yourself after your procedure. Your health care provider may also give you more specific instructions. If you have problems or questions, contact your health care provider. What can I expect after the procedure? After the procedure, it is common to have: Bruising around your puncture site. Tenderness around your puncture site. Skipped heartbeats. Tiredness (fatigue).  Follow these instructions at  home: Puncture site care  Follow instructions from your health care provider about how to take care of your puncture site. Make sure you: If present, leave stitches (sutures), skin glue, or adhesive strips in place. These skin closures may need to stay in place for up to 2 weeks. If adhesive strip edges start to loosen and curl up, you may trim the loose edges. Do not remove adhesive strips completely unless your health care provider tells you to do that. If a large square bandage is present, this may be removed 24 hours after surgery.  Check your puncture site every day for signs of infection. Check for: Redness, swelling, or pain. Fluid or blood. If your puncture site starts to bleed, lie down on your back, apply firm pressure to the area, and contact your health care provider. Warmth. Pus or a bad smell. Driving Do not drive for at least 4 days after your procedure or however long your health care provider recommends. (Do not resume driving if you have previously been instructed not to drive for other health reasons.) Do not drive or use heavy machinery while taking prescription pain medicine. Activity Avoid activities that take a lot of effort for at least 7 days after your procedure. Do not lift anything that is heavier than 5 lb (4.5 kg) for one week.  No sexual activity for 1 week.  Return to your normal activities as told by your health care provider. Ask your health care provider what activities are safe for you. General instructions Take over-the-counter and prescription medicines only as told by your health care  provider. Do not use any products that contain nicotine or tobacco, such as cigarettes and e-cigarettes. If you need help quitting, ask your health care provider. You may shower after 24 hours, but Do not take baths, swim, or use a hot tub for 1 week.  Do not drink alcohol for 24 hours after your procedure. Keep all follow-up visits as told by your health care provider. This  is important. Contact a health care provider if: You have redness, mild swelling, or pain around your puncture site. You have fluid or blood coming from your puncture site that stops after applying firm pressure to the area. Your puncture site feels warm to the touch. You have pus or a bad smell coming from your puncture site. You have a fever. You have chest pain or discomfort that spreads to your neck, jaw, or arm. You are sweating a lot. You feel nauseous. You have a fast or irregular heartbeat. You have shortness of breath. You are dizzy or light-headed and feel the need to lie down. You have pain or numbness in the arm or leg closest to your puncture site. Get help right away if: Your puncture site suddenly swells. Your puncture site is bleeding and the bleeding does not stop after applying firm pressure to the area. These symptoms may represent a serious problem that is an emergency. Do not wait to see if the symptoms will go away. Get medical help right away. Call your local emergency services (911 in the [place] Do not drive yourself to the hospital. Summary After the procedure, it is normal to have bruising and tenderness at the puncture site in your groin, neck, or forearm. Check your puncture site every day for signs of infection. Get help right away if your puncture site is bleeding and the bleeding does not stop after applying firm pressure to the area. This is a medical emergency. This information is not intended to replace advice given to you by your health care provider. Make sure you discuss any questions you have with your health care provider.

## 2021-08-15 NOTE — Progress Notes (Signed)
Pt ambulated without difficulty or bleeding.   Discharged home with her grandson who will drive and stay with pt x 24 hrs.

## 2021-08-15 NOTE — Progress Notes (Signed)
Around 1453, pt's HR went up to the 130's ST. Pt felt okay but could feel her heart racing. Pt due for D/C. [place] paged, to come to bedside.  New orders given. Will watch pt for another hour per Dr Curt Bears.

## 2021-08-15 NOTE — H&P (Signed)
Electrophysiology Office Note   Date:  08/15/2021   ID:  Briana Barrett, Briana Barrett 02-19-1946, MRN [id number]  PCP:  [name], MD  Cardiologist: Bettina Gavia Primary Electrophysiologist:  Jeffry Vogelsang Meredith Leeds, MD    Chief Complaint: Atrial fibrillation   History of Present Illness: Briana Barrett is a 75 y.o. female who is being seen today for the evaluation of atrial fibrillation at the request of No ref. provider found. Presenting today for electrophysiology evaluation.  She has a history significant for persistent atrial fibrillation, hypertension, hyperlipidemia, type 2 diabetes, coronary artery calcifications and mild chronic systolic heart failure.  She had a cardioversion 03/12/2021, but unfortunately went back into atrial fibrillation.  Today, denies symptoms of palpitations, chest pain, shortness of breath, orthopnea, PND, lower extremity edema, claudication, dizziness, presyncope, syncope, bleeding, or neurologic sequela. The patient is tolerating medications without difficulties. Plan for AF ablation today.    Past Medical History:  Diagnosis Date   Age-related osteoporosis without current pathological fracture 01/21/2016   Last Assessment & Plan:  Relevant Hx: Course: Daily Update: Today's Plan:she feels this is stable for her   Electronically signed by: [name], NP 05/11/16 1430   Allergic rhinitis 01/21/2016   Last Assessment & Plan:  Relevant Hx: Course: Daily Update: Today's Plan:this is stable for her  Electronically signed by: [name], NP 05/11/16 1427   Anemia, iron deficiency 01/21/2016   Last Assessment & Plan:  Her last iron level was 68 and she is taking the iron daily see her CBC   Anxiety 04/13/2016   Last Assessment & Plan:  She is taking the xanax more daily and not her zoloft and she thinks it helps her more   Atrial fibrillation ([place])    Bradycardia 07/02/2017   Calcification of lung 01/21/2016   Coronary artery  calcification seen on CT scan 01/21/2016   DDD (degenerative disc disease), lumbosacral 01/21/2016   Last Assessment & Plan:  Relevant Hx: Course: Daily Update: Today's Plan:she is working again at third shift at the [employer] and she is on her feet and that is making this worse for her  Electronically signed by: [name], NP 05/11/16 1429   Diabetes mellitus type 2, controlled ([place]) 01/21/2016   Last Assessment & Plan:  She does not check her sugar as her last average was good for her    Dyslipidemia 07/23/2016   Encounter for long-term (current) use of high-risk medication 01/21/2016   Episodic lightheadedness 07/02/2017   Gastroesophageal reflux disease without esophagitis 01/21/2016   Hiatal hernia 01/21/2016   Described as large on chest x-ray 2019   History of compression fracture of spine 01/21/2016   Hypercholesteremia    Hypertension    Hypertension, essential 08/27/2015   Last Assessment & Plan:  Her BP readings that she brings in here are up and down, she has a pending appt with cardiology to FU on this, and she has an extreme amount of stress at home as well that is contributing to this . She and I talked about her BP meds and she is taking a low dose of the clonidine but at this time she wants to monitor this, she is aware of how to take the losartan and is taki   Impaired functional mobility, balance, gait, and endurance 10/19/2018   Localized edema 01/21/2016   Malaise and fatigue 01/21/2016   Last Assessment & Plan:  I really feel her S/S are tied to her BP and the  heart rate and it not ideally being controlled for her with her inability to take multiple meds due to her S/e, she is frustrated with this and she stopped her amiodarone last Thursday, and she did not have any episodes since august 4-5, but is taking coreg as directed   Malignant neoplasm of right breast ([place]) 01/21/2016   Mixed hyperlipidemia 01/21/2016   Last Assessment & Plan:  Relevant Hx: Course: Daily  Update: Today's Plan:update this for her fasting  Electronically signed by: [name], NP 05/11/16 1432   Moderate recurrent major depression ([place]) 01/21/2016   Last Assessment & Plan:  Relevant Hx: Course: Daily Update: Today's Plan:this has been stable for her  Electronically signed by: [name], NP 05/11/16 1432   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[place]) 09/13/2015   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   Last Assessment & Plan:  Her last TSH was normal   Osteoarthritis, generalized 01/21/2016   Last Assessment & Plan:  Relevant Hx: Course: Daily Update: Today's Plan:this is stable for her at this time  Electronically signed by: [name], NP 05/11/16 1430   Ovarian cyst, right 01/21/2016   Paroxysmal atrial flutter ([place]) 07/23/2016   Overview:  Chads score equals 1, prefers aspirin only Overview:  Overview:  Chads score equals 1, prefers aspirin only   Plantar fasciitis    Primary insomnia 04/13/2016   Last Assessment & Plan:  Relevant Hx: Course: Daily Update: Today's Plan:this has been more difficult with her working her third shift  Electronically signed by: [name], NP 05/11/16 1433   Renal cyst, right 01/21/2016   Swelling 07/02/2017   Thoracic degenerative disc disease 06/16/2018   Past Surgical History:  Procedure Laterality Date   ANKLE SURGERY     BREAST SURGERY     CARDIOVERSION N/A 03/12/2021   Procedure: CARDIOVERSION;  Surgeon: Nahser, Wonda Cheng, MD;  Location: [facility];  Service: Cardiovascular;  Laterality: N/A;   CATARACT EXTRACTION     CHOLECYSTECTOMY     FOOT SURGERY     right   SHOULDER SURGERY     SKIN SURGERY  07/2020   nose   TUBAL LIGATION       Current Facility-Administered Medications  Medication Dose Route Frequency Provider Last Rate Last Admin   0.9 %  sodium chloride infusion   Intravenous Continuous Constance Haw, MD 50 mL/hr at 08/15/21 0548 New Bag at 08/15/21 0548    Allergies:   Amlodipine  besylate, Buprenorphine hcl, and Morphine and related   Social History:  The patient  reports that she has never smoked. She has never used smokeless tobacco. She reports that she does not drink alcohol and does not use drugs.   Family History:  The patient's family history includes COPD in her father; Cancer in her mother; Heart failure in her sister; Hypertension in her father and mother.   ROS:  Please see the history of present illness.   Otherwise, review of systems is positive for none.   All other systems are reviewed and negative.   PHYSICAL EXAM: VS:  BP (!) 176/74   Pulse 66   Temp (!) 97.5 F (36.4 C) (Oral)   Resp 18   Ht '5\' 6"'$  (1.676 m)   Wt 79.4 kg   SpO2 97%   BMI 28.25 kg/m  , BMI Body mass index is 28.25 kg/m. GEN: Well nourished, well developed, in no acute distress  HEENT: normal  Neck: no JVD, carotid bruits, or masses Cardiac: irregular; no  murmurs, rubs, or gallops,no edema  Respiratory:  clear to auscultation bilaterally, normal work of breathing GI: soft, nontender, nondistended, + BS MS: no deformity or atrophy  Skin: warm and dry Neuro:  Strength and sensation are intact Psych: euthymic mood, full affect   Recent Labs: 03/07/2021: NT-Pro BNP 1,226 07/29/2021: BUN 14; Creatinine, Ser 1.01; Hemoglobin 14.1; Platelets 262; Potassium 4.0; Sodium 145    Lipid Panel  No results found for: CHOL, TRIG, HDL, CHOLHDL, VLDL, LDLCALC, LDLDIRECT   Wt Readings from Last 3 Encounters:  08/15/21 79.4 kg  06/30/21 82 kg  05/27/21 82.4 kg      Other studies Reviewed: Additional studies/ records that were reviewed today include: TTE 08/19/20  Review of the above records today demonstrates:   1. Left ventricular ejection fraction, by estimation, is 40 to 45%. The  left ventricle has mildly decreased function. The left ventricle  demonstrates global hypokinesis. There is mild concentric left ventricular  hypertrophy. Left ventricular diastolic  parameters are  consistent with Grade II diastolic dysfunction  (pseudonormalization).   2. Abnormal left ventricular global longitudinal strain ( -7.5 %).   3. Right ventricular systolic function is normal. The right ventricular  size is normal. There is normal pulmonary artery systolic pressure.   4. Left atrial size was severely dilated.   5. The mitral valve is normal in structure. Trivial mitral valve  regurgitation. No evidence of mitral stenosis.   6. The aortic valve is normal in structure. Aortic valve regurgitation is  not visualized. Mild aortic valve sclerosis is present, with no evidence  of aortic valve stenosis.   7. There is mild dilatation of the ascending aorta measuring 36 mm.   8. The inferior vena cava is normal in size with greater than 50%  respiratory variability, suggesting right atrial pressure of 3 mmHg.  Cardiac monitor 10/09/2020 personally reviewed persistent atrial fibrillation with good heart rate control.  Although ventricular ectopy was rare the triggered and diary events are associated with isolated PVCs.  ASSESSMENT AND PLAN:  1.  Persistent atrial fibrillation:  Briana Barrett has presented today for surgery, with the diagnosis of atrial fibrillation.  The various methods of treatment have been discussed with the patient and family. After consideration of risks, benefits and other options for treatment, the patient has consented to  Procedure(s): Catheter ablation as a surgical intervention .  Risks include but not limited to complete heart block, stroke, esophageal damage, nerve damage, bleeding, vascular damage, tamponade, perforation, MI, and death. The patient's history has been reviewed, patient examined, no change in status, stable for surgery.  I have reviewed the patient's chart and labs.  Questions were answered to the patient's satisfaction.    Cristino Degroff Curt Bears, MD 08/15/2021 7:06 AM

## 2021-08-15 NOTE — Transfer of Care (Signed)
Immediate Anesthesia Transfer of Care Note  Patient: Briana Barrett  Procedure(s) Performed: ATRIAL FIBRILLATION ABLATION  Patient Location: PACU  Anesthesia Type:General  Level of Consciousness: oriented, sedated and patient cooperative  Airway & Oxygen Therapy: Patient Spontanous Breathing and Patient connected to nasal cannula oxygen  Post-op Assessment: Report given to RN and Post -op Vital signs reviewed and stable  Post vital signs: Reviewed  Last Vitals:  Vitals Value Taken Time  BP 134/73 08/15/21 1113  Temp    Pulse 80 08/15/21 1115  Resp 19 08/15/21 1115  SpO2 88 % 08/15/21 1115  Vitals shown include unvalidated device data.  Last Pain:  Vitals:   08/15/21 0540  TempSrc:   PainSc: 4       Patients Stated Pain Goal: 4 ([id number])  Complications: There were no known notable events for this encounter.

## 2021-08-17 ENCOUNTER — Encounter (HOSPITAL_COMMUNITY): Payer: Self-pay | Admitting: Cardiology

## 2021-08-18 ENCOUNTER — Telehealth: Payer: Self-pay | Admitting: Cardiology

## 2021-08-18 NOTE — Telephone Encounter (Signed)
Returned call to Pt.  Per Pt she is SOB when she ambulates to the bathroom.  Once she sits back down and takes some deep breaths she is better.  Pt states she does not weigh daily, but can tell that she has swelling in her fingers.  Per Ricky-have Pt take an extra fluid pill today. Pt rescheduled follow up for tomorrow at 9:30 am with afib clinic.  Pt indicates understanding.    Given directions to afib clinic.  Also given phone number to call if any further concerns.

## 2021-08-18 NOTE — Telephone Encounter (Signed)
Pt c/o Shortness Of Breath: STAT if SOB developed within the last 24 hours or pt is noticeably SOB on the phone  1. Are you currently SOB (can you hear that pt is SOB on the phone)? no  2. How long have you been experiencing SOB? this weekend  3. Are you SOB when sitting or when up moving around? Moving around  4. Are you currently experiencing any other symptoms? No  Patient states she had an ablation last week and all this weekend she has had SOB. She states when she sits it seems okay. She says it happens when she gets up to go to the bathroom and seems to take a long time to get back to normal. She says she has to take really deep breathes to get back to breathing normally. She was not SOB on the phone.

## 2021-08-19 ENCOUNTER — Ambulatory Visit (HOSPITAL_COMMUNITY)
Admission: RE | Admit: 2021-08-19 | Discharge: 2021-08-19 | Disposition: A | Discharge status: Home / Self Care | Payer: Medicare HMO | Source: Ambulatory Visit | Attending: Physician Assistant | Admitting: Physician Assistant

## 2021-08-19 ENCOUNTER — Encounter (HOSPITAL_COMMUNITY): Payer: Self-pay | Admitting: Physician Assistant

## 2021-08-19 ENCOUNTER — Ambulatory Visit (HOSPITAL_BASED_OUTPATIENT_CLINIC_OR_DEPARTMENT_OTHER)
Admission: RE | Admit: 2021-08-19 | Discharge: 2021-08-19 | Disposition: A | Discharge status: Home / Self Care | Payer: Medicare HMO | Source: Ambulatory Visit | Attending: Physician Assistant | Admitting: Physician Assistant

## 2021-08-19 ENCOUNTER — Other Ambulatory Visit: Payer: Self-pay

## 2021-08-19 VITALS — BP 146/90 | HR 65 | Ht 66.0 in | Wt 181.0 lb

## 2021-08-19 DIAGNOSIS — Z596 Low income: Secondary | ICD-10-CM | POA: Diagnosis not present

## 2021-08-19 DIAGNOSIS — I083 Combined rheumatic disorders of mitral, aortic and tricuspid valves: Secondary | ICD-10-CM | POA: Diagnosis not present

## 2021-08-19 DIAGNOSIS — E119 Type 2 diabetes mellitus without complications: Secondary | ICD-10-CM | POA: Insufficient documentation

## 2021-08-19 DIAGNOSIS — Z5941 Food insecurity: Secondary | ICD-10-CM | POA: Diagnosis not present

## 2021-08-19 DIAGNOSIS — I4819 Other persistent atrial fibrillation: Secondary | ICD-10-CM

## 2021-08-19 DIAGNOSIS — Z888 Allergy status to other drugs, medicaments and biological substances status: Secondary | ICD-10-CM | POA: Insufficient documentation

## 2021-08-19 DIAGNOSIS — I5023 Acute on chronic systolic (congestive) heart failure: Secondary | ICD-10-CM | POA: Diagnosis not present

## 2021-08-19 DIAGNOSIS — Z79899 Other long term (current) drug therapy: Secondary | ICD-10-CM | POA: Diagnosis not present

## 2021-08-19 DIAGNOSIS — Z885 Allergy status to narcotic agent status: Secondary | ICD-10-CM | POA: Diagnosis not present

## 2021-08-19 DIAGNOSIS — I110 Hypertensive heart disease with heart failure: Secondary | ICD-10-CM | POA: Insufficient documentation

## 2021-08-19 DIAGNOSIS — Z7901 Long term (current) use of anticoagulants: Secondary | ICD-10-CM | POA: Insufficient documentation

## 2021-08-19 DIAGNOSIS — R0600 Dyspnea, unspecified: Secondary | ICD-10-CM

## 2021-08-19 DIAGNOSIS — D6869 Other thrombophilia: Secondary | ICD-10-CM

## 2021-08-19 DIAGNOSIS — E785 Hyperlipidemia, unspecified: Secondary | ICD-10-CM | POA: Insufficient documentation

## 2021-08-19 DIAGNOSIS — R0609 Other forms of dyspnea: Secondary | ICD-10-CM | POA: Insufficient documentation

## 2021-08-19 DIAGNOSIS — Z792 Long term (current) use of antibiotics: Secondary | ICD-10-CM | POA: Diagnosis not present

## 2021-08-19 HISTORY — DX: Other thrombophilia: D68.69

## 2021-08-19 LAB — COMPREHENSIVE METABOLIC PANEL
ALT: 14 U/L (ref 0–44)
AST: 16 U/L (ref 15–41)
Albumin: 2.9 g/dL — ABNORMAL LOW (ref 3.5–5.0)
Alkaline Phosphatase: 57 U/L (ref 38–126)
Anion gap: 7 (ref 5–15)
BUN: 13 mg/dL (ref 8–23)
CO2: 27 mmol/L (ref 22–32)
Calcium: 8.2 mg/dL — ABNORMAL LOW (ref 8.9–10.3)
Chloride: 103 mmol/L (ref 98–111)
Creatinine, Ser: 0.96 mg/dL (ref 0.44–1.00)
GFR, Estimated: >60 mL/min (ref >60)
Glucose, Bld: 118 mg/dL — ABNORMAL HIGH (ref 70–99)
Potassium: 3.4 mmol/L — ABNORMAL LOW (ref 3.5–5.1)
Sodium: 137 mmol/L (ref 135–145)
Total Bilirubin: 1.1 mg/dL (ref 0.3–1.2)
Total Protein: 5.8 g/dL — ABNORMAL LOW (ref 6.5–8.1)

## 2021-08-19 LAB — ECHOCARDIOGRAM COMPLETE
AR max vel: 1.41 cm2
AV Area VTI: 1.51 cm2
AV Area mean vel: 1.44 cm2
AV Mean grad: 5 mmHg
AV Peak grad: 10 mmHg
Ao pk vel: 1.58 m/s
Area-P 1/2: 2.69 cm2
Height: 66 in
S' Lateral: 3.2 cm
Weight: 2896 oz

## 2021-08-19 LAB — POCT ACTIVATED CLOTTING TIME
Activated Clotting Time: 318 seconds
Activated Clotting Time: 341 seconds
Activated Clotting Time: 335 seconds
Activated Clotting Time: 347 seconds

## 2021-08-19 MED ORDER — FUROSEMIDE 20 MG PO TABS: ORAL_TABLET | ORAL | 3 refills | Status: DC

## 2021-08-19 NOTE — Progress Notes (Signed)
  Echocardiogram 2D Echocardiogram has been performed.  Briana Barrett 08/19/2021, 10:33 AM

## 2021-08-19 NOTE — Progress Notes (Addendum)
Primary Care Physician: [name], MD Primary Cardiologist: Dr Bettina Gavia Primary Electrophysiologist: Dr Curt Bears Referring Physician: Dr Alfredo Batty Briana Barrett is a 75 y.o. female with a history of HTN, HLD, DM, HFmrEF, and atrial fibrillation who presents for consultation in the [facility]. Patient is on Eliquis for a CHADS2VASC score of 6. Patient underwent afib ablation with Dr Curt Bears on 08/15/21. She reports significant dyspnea with exertion starting the next day. She states she can't walk to the bathroom without taking a break to catch her breath. She denies any chest pain, dizziness, heart racing or palpitations. Her cath sites are healing. She admits she did not take her Lasix Saturday or Sunday.  Today, she denies symptoms of palpitations, chest pain, orthopnea, PND, lower extremity edema, dizziness, presyncope, syncope, snoring, daytime somnolence, bleeding, or neurologic sequela. The patient is tolerating medications without difficulties and is otherwise without complaint today.    Atrial Fibrillation Risk Factors:  she does not have symptoms or diagnosis of sleep apnea. she does not have a history of rheumatic fever. she does not have a history of alcohol use.   she has a BMI of Body mass index is 29.21 kg/m.Marland Kitchen Filed Weights   08/19/21 0909  Weight: 82.1 kg    Family History  Problem Relation Age of Onset   Cancer Mother    Hypertension Mother    COPD Father    Hypertension Father    Heart failure Sister      Atrial Fibrillation Management history:  Previous antiarrhythmic drugs: none Previous cardioversions: 03/12/21 Previous ablations: 08/15/21 CHADS2VASC score: 6 Anticoagulation history: Eliquis   Past Medical History:  Diagnosis Date   Age-related osteoporosis without current pathological fracture 01/21/2016   Last Assessment & Plan:  Relevant Hx: Course: Daily Update: Today's Plan:she feels this is stable for her    Electronically signed by: [name], NP 05/11/16 1430   Allergic rhinitis 01/21/2016   Last Assessment & Plan:  Relevant Hx: Course: Daily Update: Today's Plan:this is stable for her  Electronically signed by: [name], NP 05/11/16 1427   Anemia, iron deficiency 01/21/2016   Last Assessment & Plan:  Her last iron level was 68 and she is taking the iron daily see her CBC   Anxiety 04/13/2016   Last Assessment & Plan:  She is taking the xanax more daily and not her zoloft and she thinks it helps her more   Atrial fibrillation ([place])    Bradycardia 07/02/2017   Calcification of lung 01/21/2016   Coronary artery calcification seen on CT scan 01/21/2016   DDD (degenerative disc disease), lumbosacral 01/21/2016   Last Assessment & Plan:  Relevant Hx: Course: Daily Update: Today's Plan:she is working again at third shift at the [employer] and she is on her feet and that is making this worse for her  Electronically signed by: [name], NP 05/11/16 1429   Diabetes mellitus type 2, controlled ([place]) 01/21/2016   Last Assessment & Plan:  She does not check her sugar as her last average was good for her    Dyslipidemia 07/23/2016   Encounter for long-term (current) use of high-risk medication 01/21/2016   Episodic lightheadedness 07/02/2017   Gastroesophageal reflux disease without esophagitis 01/21/2016   Hiatal hernia 01/21/2016   Described as large on chest x-ray 2019   History of compression fracture of spine 01/21/2016   Hypercholesteremia    Hypertension    Hypertension, essential 08/27/2015  Last Assessment & Plan:  Her BP readings that she brings in here are up and down, she has a pending appt with cardiology to FU on this, and she has an extreme amount of stress at home as well that is contributing to this . She and I talked about her BP meds and she is taking a low dose of the clonidine but at this time she wants to monitor this, she is aware of how to  take the losartan and is taki   Impaired functional mobility, balance, gait, and endurance 10/19/2018   Localized edema 01/21/2016   Malaise and fatigue 01/21/2016   Last Assessment & Plan:  I really feel her S/S are tied to her BP and the heart rate and it not ideally being controlled for her with her inability to take multiple meds due to her S/e, she is frustrated with this and she stopped her amiodarone last Thursday, and she did not have any episodes since august 4-5, but is taking coreg as directed   Malignant neoplasm of right breast ([place]) 01/21/2016   Mixed hyperlipidemia 01/21/2016   Last Assessment & Plan:  Relevant Hx: Course: Daily Update: Today's Plan:update this for her fasting  Electronically signed by: [name], NP 05/11/16 1432   Moderate recurrent major depression ([place]) 01/21/2016   Last Assessment & Plan:  Relevant Hx: Course: Daily Update: Today's Plan:this has been stable for her  Electronically signed by: [name], NP 05/11/16 1432   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[place]) 09/13/2015   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   Last Assessment & Plan:  Her last TSH was normal   Osteoarthritis, generalized 01/21/2016   Last Assessment & Plan:  Relevant Hx: Course: Daily Update: Today's Plan:this is stable for her at this time  Electronically signed by: [name], NP 05/11/16 1430   Ovarian cyst, right 01/21/2016   Paroxysmal atrial flutter ([place]) 07/23/2016   Overview:  Chads score equals 1, prefers aspirin only Overview:  Overview:  Chads score equals 1, prefers aspirin only   Plantar fasciitis    Primary insomnia 04/13/2016   Last Assessment & Plan:  Relevant Hx: Course: Daily Update: Today's Plan:this has been more difficult with her working her third shift  Electronically signed by: [name], NP 05/11/16 1433   Renal cyst, right 01/21/2016   Swelling 07/02/2017   Thoracic degenerative disc disease 06/16/2018   Past Surgical History:   Procedure Laterality Date   ANKLE SURGERY     ATRIAL FIBRILLATION ABLATION N/A 08/15/2021   Procedure: ATRIAL FIBRILLATION ABLATION;  Surgeon: Constance Haw, MD;  Location: [facility];  Service: Cardiovascular;  Laterality: N/A;   BREAST SURGERY     CARDIOVERSION N/A 03/12/2021   Procedure: CARDIOVERSION;  Surgeon: Acie Fredrickson Wonda Cheng, MD;  Location: [facility];  Service: Cardiovascular;  Laterality: N/A;   CATARACT EXTRACTION     CHOLECYSTECTOMY     FOOT SURGERY     right   SHOULDER SURGERY     SKIN SURGERY  07/2020   nose   TUBAL LIGATION      Current Outpatient Medications  Medication Sig Dispense Refill   acetaminophen (TYLENOL) 650 MG CR tablet Take 650 mg by mouth every 8 (eight) hours as needed for pain.     ALPRAZolam (XANAX) 0.5 MG tablet Take 0.5 mg by mouth at bedtime.     apixaban (ELIQUIS) 5 MG TABS tablet Take 1 tablet (5 mg total) by mouth 2 (two) times daily. 60 tablet  3   ciprofloxacin (CIPRO) 500 MG tablet Take 500 mg by mouth 2 (two) times daily.     furosemide (LASIX) 20 MG tablet Take 1 tablet (20 mg total) by mouth 2 (two) times daily. 180 tablet 3   losartan (COZAAR) 100 MG tablet Take 1 tablet (100 mg total) by mouth daily. 90 tablet 3   metoprolol succinate (TOPROL XL) 100 MG 24 hr tablet Take 1 tablet (100 mg total) by mouth daily. Take with or immediately following a meal. 30 tablet 11   metoprolol tartrate (LOPRESSOR) 25 MG tablet Take 1 tablet (25 mg total) by mouth 2 (two) times daily. 60 tablet 11   omeprazole (PRILOSEC) 40 MG capsule Take 40 mg by mouth at bedtime.     potassium chloride SA (KLOR-CON) 20 MEQ tablet Take 1 tablet (20 mEq total) by mouth 2 (two) times daily. 180 tablet 2   trolamine salicylate (ASPERCREME) 10 % cream Apply 1 application topically daily as needed for muscle pain.     No current facility-administered medications for this encounter.    Allergies  Allergen Reactions   Amlodipine Besylate Swelling    Buprenorphine Hcl Other (See Comments)    hypotensive     Morphine And Related     hypotensive    Social History   Socioeconomic History   Marital status: Widowed    Spouse name: Not on file   Number of children: Not on file   Years of education: Not on file   Highest education level: Not on file  Occupational History   Not on file  Tobacco Use   Smoking status: Never   Smokeless tobacco: Never  Vaping Use   Vaping Use: Never used  Substance and Sexual Activity   Alcohol use: No   Drug use: No   Sexual activity: Not on file  Other Topics Concern   Not on file  Social History Narrative   ** Merged History Encounter **       Social Determinants of Health   Financial Resource Strain: High Risk   Difficulty of Paying Living Expenses: Hard  Food Insecurity: Food Insecurity Present   Worried About Running Out of Food in the Last Year: Sometimes true   Ran Out of Food in the Last Year: Sometimes true  Transportation Needs: No Transportation Needs   Lack of Transportation (Medical): No   Lack of Transportation (Non-Medical): No  Physical Activity: Not on file  Stress: Stress Concern Present   Feeling of Stress : Rather much  Social Connections: Not on file  Intimate Partner Violence: Not on file     ROS- All systems are reviewed and negative except as per the HPI above.  Physical Exam: Vitals:   08/19/21 0909  BP: (!) 146/90  Pulse: 65  SpO2: 94%  Weight: 82.1 kg  Height: '5\' 6"'$  (1.676 m)    GEN- The patient is a well appearing elderly female, alert and oriented x 3 today.   Head- normocephalic, atraumatic Eyes-  Sclera clear, conjunctiva pink Ears- hearing intact Oropharynx- clear Neck- supple  Lungs- Clear to ausculation bilaterally, labored breathing on standing Heart- Regular rate and rhythm, no murmurs, rubs or gallops  GI- soft, NT, ND, + BS Extremities- no clubbing, cyanosis, or edema MS- no significant deformity or atrophy Skin- no rash or  lesion Psych- euthymic mood, full affect Neuro- strength and sensation are intact  Wt Readings from Last 3 Encounters:  08/19/21 82.1 kg  08/15/21 79.4 kg  06/30/21 82  kg    EKG today demonstrates  SR, PAC Vent. rate 65 BPM PR interval 184 ms QRS duration 114 ms QT/QTcB 390/405 ms  Echo 08/19/20 demonstrated   1. Left ventricular ejection fraction, by estimation, is 40 to 45%. The left ventricle has mildly decreased function. The left ventricle  demonstrates global hypokinesis. There is mild concentric left ventricular hypertrophy. Left ventricular diastolic parameters are consistent with Grade II diastolic dysfunction (pseudonormalization).   2. Abnormal left ventricular global longitudinal strain ( -7.5 %).   3. Right ventricular systolic function is normal. The right ventricular  size is normal. There is normal pulmonary artery systolic pressure.   4. Left atrial size was severely dilated.   5. The mitral valve is normal in structure. Trivial mitral valve  regurgitation. No evidence of mitral stenosis.   6. The aortic valve is normal in structure. Aortic valve regurgitation is not visualized. Mild aortic valve sclerosis is present, with no evidence of aortic valve stenosis.   7. There is mild dilatation of the ascending aorta measuring 36 mm.   8. The inferior vena cava is normal in size with greater than 50%  respiratory variability, suggesting right atrial pressure of 3 mmHg.   Comparison(s): A prior study was performed on 10/24/2018. Changes in the  ejection fraction is noted was 55-60% and now depressed to 40-45%.   Epic records are reviewed at length today  CHA2DS2-VASc Score = 6  The patient's score is based upon: CHF History: Yes HTN History: Yes Diabetes History: Yes Stroke History: No Vascular Disease History: No Age Score: 2 Gender Score: 1      ASSESSMENT AND PLAN: 1. Persistent Atrial Fibrillation (ICD10:  I48.19) The patient's CHA2DS2-VASc score is 6,  indicating a 9.7% annual risk of stroke.   S/p afib ablation with Dr Curt Bears on 08/15/21 Patient appears to be maintaining SR. Continue Toprol 100 mg daily Continue Eliquis 5 mg BID with no missed doses for 3 months post ablation.   2. Secondary Hypercoagulable State (ICD10:  D68.69) The patient is at significant risk for stroke/thromboembolism based upon her CHA2DS2-VASc Score of 6.  Continue Apixaban (Eliquis).   3. Acute on chronic HFmrEF Weight is stable however CXR shows bilataleral pleural effusions with pulmonary vascular congestion.  Echo at visit today shows no pericardial effusion.  Increase Lasix to 60 mg BID x 3 days. Also increase KCL to 60 meq daily.  Check cmet  4. Dyspnea Patient having dyspnea with minimal exertion today. O2 sat dropped from 95% to 87% just with standing and taking a few steps.  See plans above.    Follow up with Dr Bettina Gavia and Dr Curt Bears as scheduled. Patient to call clinic back if she does not improve over the next 1-2 days.    [facility] [address] [place], [place] [address] [phone] 08/19/2021 9:51 AM

## 2021-08-20 ENCOUNTER — Ambulatory Visit (HOSPITAL_COMMUNITY): Payer: Medicare HMO | Admitting: Physician Assistant

## 2021-08-22 ENCOUNTER — Other Ambulatory Visit: Payer: Self-pay

## 2021-08-22 ENCOUNTER — Telehealth (HOSPITAL_COMMUNITY): Payer: Self-pay | Admitting: *Deleted

## 2021-08-22 NOTE — Telephone Encounter (Signed)
Pt called with update = breathing much improved after increasing lasix. She is returning to her normal dose of lasix today. She still has fatigue but she is starting to increase her activity. Pt has follow up with Dr. Bettina Gavia next week but she will call if issues arise. Pt very appreciative for her care this week.

## 2021-08-26 ENCOUNTER — Telehealth: Payer: Self-pay | Admitting: Licensed Clinical Social Worker

## 2021-08-26 NOTE — Telephone Encounter (Signed)
LCSW attempted check in call to pt today ([phone]; preferred #).  Phone rang but unfortunately no voicemail set up at this time. LCSW will reattempt later this week as pt may be at work currently.   [name], MSW, [place]  [phone]

## 2021-08-26 NOTE — Telephone Encounter (Signed)
Pt returned my call. She shares that after her visits last week she has been progressively feeling much better. She has spent the morning though speaking with [facility] since she had elected to change her Medicare benefits to that plan but she has had some confusion around what her benefits and coverage is going to look like. She has had a hard time getting answers/in touch with someone about whether or not she has coverage and she is concerned it has lapsed for the month. LCSW shared the [facility] line for [facility] and encouraged pt to call to see what her next steps should be; she likely will have to continue to try and communicate with [facility] directly to understand benefits. I will also try and reach out to billing here to see if we can see anything from our office.   [name], MSW, [place]  [phone]

## 2021-08-29 ENCOUNTER — Ambulatory Visit: Payer: Medicare Other | Admitting: Cardiology

## 2021-08-29 ENCOUNTER — Encounter: Payer: Self-pay | Admitting: Cardiology

## 2021-08-29 ENCOUNTER — Other Ambulatory Visit: Payer: Self-pay

## 2021-08-29 VITALS — BP 142/72 | HR 54 | Ht 66.0 in | Wt 174.2 lb

## 2021-08-29 DIAGNOSIS — I480 Paroxysmal atrial fibrillation: Secondary | ICD-10-CM

## 2021-08-29 DIAGNOSIS — I119 Hypertensive heart disease without heart failure: Secondary | ICD-10-CM | POA: Diagnosis not present

## 2021-08-29 DIAGNOSIS — Z7901 Long term (current) use of anticoagulants: Secondary | ICD-10-CM | POA: Diagnosis not present

## 2021-08-29 NOTE — Patient Instructions (Signed)
Medication Instructions:  Your physician recommends that you continue on your current medications as directed. Please refer to the Current Medication list given to you today.  *If you need a refill on your cardiac medications before your next appointment, please call your pharmacy*   Lab Work: Your physician recommends that you return for lab work in: [place] If you have labs (blood work) drawn today and your tests are completely normal, you will receive your results only by: [place] (if you have [place]) OR A paper copy in the mail If you have any lab test that is abnormal or we need to change your treatment, we will call you to review the results.   Testing/Procedures: None   Follow-Up: At [facility], you and your health needs are our priority.  As part of our continuing mission to provide you with exceptional heart care, we have created designated Provider Care Teams.  These Care Teams include your primary Cardiologist (physician) and Advanced Practice Providers (APPs -  Physician Assistants and Nurse Practitioners) who all work together to provide you with the care you need, when you need it.  We recommend signing up for the patient portal called "MyChart".  Sign up information is provided on this After Visit Summary.  MyChart is used to connect with patients for Virtual Visits (Telemedicine).  Patients are able to view lab/test results, encounter notes, upcoming appointments, etc.  Non-urgent messages can be sent to your provider as well.   To learn more about what you can do with MyChart, go to [email or web address].    Your next appointment:   3 month(s)  The format for your next appointment:   In Person  Provider:   Shirlee More, MD   Other Instructions Please check your weight daily. If you gain more then 3 lbs in one day then take 40 mg of your furosemide by mouth twice daily until you get back to your baseline.

## 2021-08-29 NOTE — Progress Notes (Signed)
Cardiology Office Note:    Date:  08/29/2021   ID:  Briana Barrett, DOB [birth year], MRN [id number]  PCP:  [name], MD  Cardiologist:  Shirlee More, MD    Referring MD: [name], MD    ASSESSMENT:    1. Paroxysmal atrial fibrillation (HCC)   2. Chronic anticoagulation   3. 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  She will continue her current anticoagulant as well as antihypertensive medication beta-blocker and ARB.   Next appointment: 3 months.   Medication Adjustments/Labs and Tests Ordered: Current medicines are reviewed at length with the patient today.  Concerns regarding medicines are outlined above.  No orders of the defined types were placed in this encounter.  No orders of the defined types were placed in this encounter.   Chief Complaint  Patient presents with   Follow-up   Atrial Fibrillation    After EP catheter ablation resuming sinus rhythm   Congestive Heart Failure   Hypertension   Anticoagulation     History of Present Illness:    Briana Barrett is a 75 y.o. female with a hx of persistent atrial fibrillation failing cardioversion anticoagulation and hypertensive heart disease without heart failure and symptomatic hypotension following COVID-19 infection last seen 05/27/2021.  She is referred for EP consultation and was last seen 08/19/2021 is maintaining sinus rhythm following EP catheter ablation 08/15/2021.  Compliance with diet, lifestyle and medications: Yes  Soon after ablation she was more short of breath doubled her  diuretic back to baseline and her shortness of breath edema orthopnea resolved she feels so much better and is not having a profound exercise intolerance she had in the past.  She tolerates her anticoagulant without bleeding.  She screens her heart rhythm at home with a pulse meter and I asked her to go on by the mobile device to use her iPhone to follow her heart rhythm at home for recurrent atrial fibrillation.  Echocardiogram performed 08/19/2021 showed ejection fraction 50 to 55% low normal the right ventricle is normal in size function and systolic pressure the right atrium is mildly enlarged and there is no pericardial effusion.  She had mild mitral regurgitation with annular calcification. Past Medical History:  Diagnosis Date   Age-related osteoporosis without current pathological fracture 01/21/2016   Last Assessment & Plan:  Relevant Hx: Course: Daily Update: Today's Plan:she feels this is stable for her   Electronically signed by: [name], NP 05/11/16 1430   Allergic rhinitis 01/21/2016   Last Assessment & Plan:  Relevant Hx: Course: Daily Update: Today's Plan:this is stable for her  Electronically signed by: [name], NP 05/11/16 1427   Anemia, iron deficiency 01/21/2016   Last Assessment & Plan:  Her last iron level was 68 and she is taking the iron daily see her CBC   Anxiety 04/13/2016   Last Assessment & Plan:  She is taking the xanax more daily and not her zoloft and she thinks it helps her more   Atrial fibrillation ([place])    Bilateral primary osteoarthritis of  knee 03/25/2021   Bradycardia 07/02/2017   Calcification of lung 01/21/2016   Coronary artery calcification seen on CT scan 01/21/2016   DDD (degenerative disc disease), lumbosacral 01/21/2016   Last Assessment & Plan:  Relevant Hx: Course: Daily Update: Today's Plan:she is working again at third shift at the [employer] and she is on her feet and that is making this worse for her   Electronically signed by: [name], NP 05/11/16 1429   Diabetes mellitus type 2, controlled ([place]) 01/21/2016   Last Assessment & Plan:  She does not check her sugar as her last average was good for her    Diastolic congestive heart failure, NYHA class 2 ([place]) 11/01/2018   Dyslipidemia 07/23/2016   Encounter for long-term (current) use of high-risk medication 01/21/2016   Episodic lightheadedness 07/02/2017   Gastroesophageal reflux disease without esophagitis 01/21/2016   Hiatal hernia 01/21/2016   Described as large on chest x-ray 2019   History of compression fracture of spine 01/21/2016   History of right breast cancer 01/21/2016   Hypercholesteremia    Hypertension    Hypertension, essential 08/27/2015   Last Assessment & Plan:  Her BP readings that she brings in here are up and down, she has a pending appt with cardiology to FU on this, and she has an extreme amount of stress at home as well that is contributing to this . 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   Impaired functional mobility, balance, gait, and endurance 10/19/2018   Localized edema 01/21/2016   Malaise and fatigue 01/21/2016   Last Assessment & Plan:  I really feel her S/S are tied to her BP and the heart rate and it not ideally being controlled for her with her inability to take multiple meds due to her S/e, she is frustrated with this and she stopped her amiodarone last Thursday, and she did not have any episodes since august 4-5, but is taking coreg as directed   Malignant neoplasm of right breast ([place]) 01/21/2016   Mixed hyperlipidemia 01/21/2016   Last Assessment & Plan:  Relevant Hx: Course: Daily Update: Today's Plan:update this for her fasting  Electronically signed by: [name], NP  05/11/16 1432   Moderate recurrent major depression ([place]) 01/21/2016   Last Assessment & Plan:  Relevant Hx: Course: Daily Update: Today's Plan:this has been stable for her  Electronically signed by: [name], NP 05/11/16 1432   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[place]) 09/13/2015   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   Last Assessment & Plan:  Her last TSH was normal   Osteoarthritis, generalized 01/21/2016   Last Assessment & Plan:  Relevant Hx: Course: Daily Update: Today's Plan:this is stable for her at this time  Electronically signed by: [name], NP 05/11/16 1430   Ovarian cyst, right 01/21/2016   Paroxysmal atrial flutter ([place]) 07/23/2016   Overview:  Chads score equals 1, prefers aspirin only Overview:  Overview:  Chads score equals 1, prefers aspirin only   Permanent atrial fibrillation ([place]) 10/02/2020   Plantar fasciitis    Prediabetes 01/21/2016   Last Assessment & Plan:  Formatting of this note might be different from the original. She does not check her sugar as her last average was good for  her   Primary insomnia 04/13/2016   Last Assessment & Plan:  Relevant Hx: Course: Daily Update: Today's Plan:this has been more difficult with her working her third shift  Electronically signed by: [name], NP 05/11/16 1433   Renal cyst, right 01/21/2016   Secondary hypercoagulable state ([place]) 08/19/2021   Severe episode of recurrent major depressive disorder, without psychotic features ([place]) 04/12/2020   Swelling 07/02/2017   Thoracic degenerative disc disease 06/16/2018   Tremor 10/21/2018    Past Surgical History:  Procedure Laterality Date   ANKLE SURGERY     ATRIAL FIBRILLATION ABLATION N/A 08/15/2021   Procedure: ATRIAL FIBRILLATION ABLATION;  Surgeon: Constance Haw, MD;  Location: [facility];  Service: Cardiovascular;  Laterality: N/A;   BREAST SURGERY     CARDIOVERSION N/A 03/12/2021   Procedure: CARDIOVERSION;  Surgeon: Acie Fredrickson Wonda Cheng, MD;   Location: [facility];  Service: Cardiovascular;  Laterality: N/A;   CATARACT EXTRACTION     CHOLECYSTECTOMY     FOOT SURGERY     right   SHOULDER SURGERY     SKIN SURGERY  07/2020   nose   TUBAL LIGATION      Current Medications: Current Meds  Medication Sig   acetaminophen (TYLENOL) 650 MG CR tablet Take 650 mg by mouth every 8 (eight) hours as needed for pain.   ALPRAZolam (XANAX) 0.5 MG tablet Take 0.5 mg by mouth at bedtime.   apixaban (ELIQUIS) 5 MG TABS tablet Take 1 tablet (5 mg total) by mouth 2 (two) times daily.   furosemide (LASIX) 20 MG tablet Take '40mg'$  in the AM and '20mg'$  in the PM   losartan (COZAAR) 100 MG tablet Take 1 tablet (100 mg total) by mouth daily.   metoprolol succinate (TOPROL XL) 100 MG 24 hr tablet Take 1 tablet (100 mg total) by mouth daily. Take with or immediately following a meal.   omeprazole (PRILOSEC) 40 MG capsule Take 40 mg by mouth at bedtime.   potassium chloride SA (KLOR-CON) 20 MEQ tablet Take 1 tablet (20 mEq total) by mouth 2 (two) times daily.   trolamine salicylate (ASPERCREME) 10 % cream Apply 1 application topically daily as needed for muscle pain.     Allergies:   Amlodipine besylate, Buprenorphine hcl, and Morphine and related   Social History   Socioeconomic History   Marital status: Widowed    Spouse name: Not on file   Number of children: Not on file   Years of education: Not on file   Highest education level: Not on file  Occupational History   Not on file  Tobacco Use   Smoking status: Never   Smokeless tobacco: Never  Vaping Use   Vaping Use: Never used  Substance and Sexual Activity   Alcohol use: No   Drug use: No   Sexual activity: Not on file  Other Topics Concern   Not on file  Social History Narrative   ** Merged History Encounter **       Social Determinants of Health   Financial Resource Strain: High Risk   Difficulty of Paying Living Expenses: Hard  Food Insecurity: Food Insecurity Present    Worried About Running Out of Food in the Last Year: Sometimes true   Ran Out of Food in the Last Year: Sometimes true  Transportation Needs: No Transportation Needs   Lack of Transportation (Medical): No   Lack of Transportation (Non-Medical): No  Physical Activity: Not on file  Stress: Stress Concern  Present   Feeling of Stress : Rather much  Social Connections: Not on file     Family History: The patient's family history includes COPD in her father; Cancer in her mother; Heart failure in her sister; Hypertension in her father and mother. ROS:   Please see the history of present illness.    All other systems reviewed and are negative.  EKGs/Labs/Other Studies Reviewed:    The following studies were reviewed today:    Recent Labs: 03/07/2021: NT-Pro BNP 1,226 07/29/2021: Hemoglobin 14.1; Platelets 262 08/19/2021: ALT 14; BUN 13; Creatinine, Ser 0.96; Potassium 3.4; Sodium 137  Recent Lipid Panel No results found for: CHOL, TRIG, HDL, CHOLHDL, VLDL, LDLCALC, LDLDIRECT  Physical Exam:    VS:  BP (!) 142/72   Pulse (!) 54   Ht '5\' 6"'$  (1.676 m)   Wt 174 lb 3.2 Barrett (79 kg)   SpO2 97%   BMI 28.12 kg/m     Wt Readings from Last 3 Encounters:  08/29/21 174 lb 3.2 Barrett (79 kg)  08/19/21 181 lb (82.1 kg)  08/15/21 175 lb (79.4 kg)     GEN:  Well nourished, well developed in no acute distress HEENT: Normal NECK: No JVD; No carotid bruits LYMPHATICS: No lymphadenopathy CARDIAC: RRR, no murmurs, rubs, gallops RESPIRATORY:  Clear to auscultation without rales, wheezing or rhonchi  ABDOMEN: Soft, non-tender, non-distended MUSCULOSKELETAL:  No edema; No deformity  SKIN: Warm and dry NEUROLOGIC:  Alert and oriented x 3 PSYCHIATRIC:  Normal affect    Signed, Shirlee More, MD  08/29/2021 1:07 PM    [place]

## 2021-08-29 NOTE — Addendum Note (Signed)
Addended by: Resa Miner I on: 08/29/2021 01:12 PM   Modules accepted: Orders

## 2021-08-30 LAB — BASIC METABOLIC PANEL
BUN/Creatinine Ratio: 19 (ref 12–28)
BUN: 16 mg/dL (ref 8–27)
CO2: 28 mmol/L (ref 20–29)
Calcium: 9.5 mg/dL (ref 8.7–10.3)
Chloride: 101 mmol/L (ref 96–106)
Creatinine, Ser: 0.86 mg/dL (ref 0.57–1.00)
Glucose: 77 mg/dL (ref 65–99)
Potassium: 4.3 mmol/L (ref 3.5–5.2)
Sodium: 142 mmol/L (ref 134–144)
eGFR: 70 mL/min/{1.73_m2} (ref >59)

## 2021-08-30 LAB — PRO B NATRIURETIC PEPTIDE: NT-Pro BNP: 683 pg/mL (ref 0–738)

## 2021-09-01 ENCOUNTER — Telehealth: Payer: Self-pay

## 2021-09-01 NOTE — Telephone Encounter (Signed)
Left message on patients voicemail to please return our call.   

## 2021-09-01 NOTE — Telephone Encounter (Signed)
-----   Message from Richardo Priest, MD sent at 08/31/2021 11:54 AM EDT ----- Potassium is a little bit low lets take an extra tablet every other day.

## 2021-09-01 NOTE — Telephone Encounter (Signed)
Patient returning call.

## 2021-09-02 ENCOUNTER — Telehealth: Payer: Self-pay

## 2021-09-02 MED ORDER — POTASSIUM CHLORIDE CRYS ER 20 MEQ PO TBCR: EXTENDED_RELEASE_TABLET | ORAL | 3 refills | Status: DC

## 2021-09-02 MED ORDER — POTASSIUM CHLORIDE CRYS ER 20 MEQ PO TBCR: 20.0000 meq | EXTENDED_RELEASE_TABLET | Freq: Three times a day (TID) | ORAL | 3 refills | Status: DC

## 2021-09-02 NOTE — Telephone Encounter (Signed)
-----   Message from Richardo Priest, MD sent at 08/31/2021 11:54 AM EDT ----- Potassium is a little bit low lets take an extra tablet every other day.

## 2021-09-02 NOTE — Telephone Encounter (Signed)
Spoke to the patient just now and let her know Dr. Joya Gaskins recommendations. She verbalizes understanding and thanks me for the call back.

## 2021-09-02 NOTE — Telephone Encounter (Signed)
Spoke with patient regarding results and recommendation.  Patient verbalizes understanding but tells me that she is already taking her potassium 20 meq TID. I will route to Dr. Bettina Gavia to see if he has any other recommendations.

## 2021-09-02 NOTE — Addendum Note (Signed)
Addended by: Resa Miner I on: 09/02/2021 09:22 AM   Modules accepted: Orders

## 2021-09-05 ENCOUNTER — Telehealth: Payer: Self-pay | Admitting: Licensed Clinical Social Worker

## 2021-09-05 NOTE — Progress Notes (Signed)
Heart and Vascular Care Navigation  09/05/2021  Briana Barrett 1946-12-14 [id number]  Reason for Referral:    Engaged with patient by telephone for follow up visit for Heart and Vascular Care Coordination.                                                                                                   Assessment:                   Received a call from pt this morning. She shares that she is still feeling fatigued regularly without much activity. She is wondering if she is still expected to feel this way. LCSW let pt know that I would reach out to Dr. Joya Gaskins nurse to see if they have any recommendations. Pt shares she still hasnt received her bills from previous procedures. She did sort out her Medicare questions but ended up having to pay out of pocket in order to meet a cap. Unfortunately, this out of pocket has put her in a financial bind again. Along with that her rent has increased by about $100 for her mobile home plot. She shares that with all these costs and not being able to work has led her to consider applying for Medicaid again. I shared that I think that along with applying for [facility] and [facility] w/ DSS may assist with managing these ongoing costs. Pt inquiring if I can help her obtain her bills to bring with her to DSS and if any additional Patient Care Fund assistance would be available. LCSW shared I would f/u on these requests and give pt a call back with f/u plan.            HRT/VAS Care Coordination     Patients Home Cardiology Office [facility]   Outpatient Care Team Social Worker   Social Worker Name: [name], LCSW, [facility]   Living arrangements for the past 2 months Mobile Home   Lives with: Self   Patient Current Insurance Coverage Managed Medicare   Patient Has Concern With [place] Yes   Patient Concerns With Medical Bills on payment plan, ongoing medical work up   Medical Bill Referrals: [facility], assistance from Patient  [place] to utilize income for outstanding bills   Does Patient Have Prescription Coverage? Yes   Patient Prescription Assistance Programs Other   Other Assistance Programs Medications general copay assistance       Social History:                                                                             SDOH Screenings   Alcohol Screen: Not on file  Depression [id number]): Not on file  Financial Resource Strain: High Risk   Difficulty of Paying Living Expenses: Hard  Food Insecurity: Food  Insecurity Present   Worried About Charity fundraiser in the Last Year: Sometimes true   Ran Out of Food in the Last Year: Sometimes true  Housing: [place] Risk Score: 0  Physical Activity: Not on file  Social Connections: Not on file  Stress: Stress Concern Present   Feeling of Stress : Rather much  Tobacco Use: Low Risk    Smoking Tobacco Use: Never   Smokeless Tobacco Use: Never  Transportation Needs: No Transportation Needs   Lack of Transportation (Medical): No   Lack of Transportation (Non-Medical): No    SDOH Interventions: Financial Resources:  Financial Strain Interventions: Other (Comment) (mailed Medicaid application, mailed LIHEAP/LIHWAP applications; referral to patient care fund) DSS for financial assistance    Follow-up plan:   LCSW spoke with [facility] billing and they recommended pt reach out to main billing dept for [facility] [employer] [phone]). Team lead out of the office today so unable to make request for Patient Care Fund but will make that request on Monday. Pt was informed of the above. I have placed the Medicaid, LIHWAP and LIHEAP applications in the mail for pt.   [name], MSW, [place]  [phone]

## 2021-09-08 ENCOUNTER — Telehealth: Payer: Self-pay | Admitting: Licensed Clinical Social Worker

## 2021-09-08 NOTE — Telephone Encounter (Signed)
LCSW reached out to Dr. Joya Gaskins office via Lilia Pro, RN, to update her on pt concerns of ongoing fatigue. She will pass these on to provider covering today. LCSW called and checked in with pt today, she is doing okay. I requested she bring her Nicoletta Dress to [employer] office with my name and title on it and we can submit it for assistance again from pt care fund. Pt aware that this assistance is not ongoing.   [name], MSW, [place]  [phone]

## 2021-09-09 DIAGNOSIS — N907 Vulvar cyst: Secondary | ICD-10-CM

## 2021-09-09 HISTORY — DX: Vulvar cyst: N90.7

## 2021-09-09 NOTE — Telephone Encounter (Signed)
Left message on patients voicemail to please return our call.   

## 2021-09-10 ENCOUNTER — Telehealth: Payer: Self-pay | Admitting: Licensed Clinical Social Worker

## 2021-09-10 NOTE — Telephone Encounter (Signed)
Pt was able to text this Probation officer and let me know that she dropped her light bill in drop box at DSS. Unfortunately, I do not have access to that drop box so I shared w/ pt that I would reach out to DSS and see if it has been collected yet. I was able to call main number and was transferred to a case worker: [phone]. I left a message and will reattempt as able. If unable to get the light bill returned I will assist pt with requesting a new one from [facility] and will pass it on to patient care fund.   [name], MSW, [place]  [phone]

## 2021-09-10 NOTE — Telephone Encounter (Signed)
Pt having some challenges w/ phone. Has attempted to reach me via telephone yesterday and today from her cell phone, I could hear her but she could not hear me. Was able to reach her on her home phone. Pt received another call while I was on phone and she shared she would call me back.   [name], MSW, [place]  [phone]

## 2021-09-11 NOTE — Telephone Encounter (Signed)
Spoke to the patient just now and let her know Dr. Terrial Rhodes recommendations. She verbalizes understanding and thanks me for the calling her back.    Encouraged patient to call back with any questions or concerns.

## 2021-09-15 ENCOUNTER — Ambulatory Visit (HOSPITAL_COMMUNITY): Payer: Medicare HMO | Admitting: Physician Assistant

## 2021-09-15 ENCOUNTER — Telehealth: Payer: Self-pay | Admitting: Licensed Clinical Social Worker

## 2021-09-15 ENCOUNTER — Institutional Professional Consult (permissible substitution): Payer: Medicare HMO | Admitting: Cardiology

## 2021-09-15 NOTE — Telephone Encounter (Signed)
LCSW able to assist pt w/ Nicoletta Dress through [employer]. Receipt sent to team leadership and pt for her records. Pt received additional assistance applications; we discussed calling DSS for appointment to complete those. Pt will look over and states she likely will bring them to DSS in mid October if not sooner. LCSW remains available to assist pt as needed/able.   [name], MSW, [place]  [phone]

## 2021-09-19 ENCOUNTER — Ambulatory Visit (HOSPITAL_COMMUNITY): Payer: Medicare Other | Admitting: Physician Assistant

## 2021-09-22 ENCOUNTER — Other Ambulatory Visit: Payer: Self-pay

## 2021-09-22 MED ORDER — LOSARTAN POTASSIUM 100 MG PO TABS: 100.0000 mg | ORAL_TABLET | Freq: Every day | ORAL | 2 refills | Status: DC

## 2021-09-22 MED ORDER — METOPROLOL SUCCINATE ER 100 MG PO TB24: 100.0000 mg | ORAL_TABLET | Freq: Every day | ORAL | 2 refills | Status: DC

## 2021-09-22 MED ORDER — FUROSEMIDE 20 MG PO TABS: ORAL_TABLET | ORAL | 2 refills | Status: DC

## 2021-09-22 MED ORDER — POTASSIUM CHLORIDE CRYS ER 20 MEQ PO TBCR: EXTENDED_RELEASE_TABLET | ORAL | 2 refills | Status: DC

## 2021-09-22 MED ORDER — APIXABAN 5 MG PO TABS: 5.0000 mg | ORAL_TABLET | Freq: Two times a day (BID) | ORAL | 1 refills | Status: DC

## 2021-09-22 NOTE — Telephone Encounter (Signed)
Prescription refill request for Eliquis received. Indication:afib Last office visit:[place] 08/29/21 Scr:0.86 08/29/21 Age: 78f Weight:79kg

## 2021-09-25 ENCOUNTER — Other Ambulatory Visit: Payer: Self-pay

## 2021-09-25 MED ORDER — FUROSEMIDE 20 MG PO TABS: ORAL_TABLET | ORAL | 2 refills | Status: DC

## 2021-09-25 MED ORDER — POTASSIUM CHLORIDE CRYS ER 20 MEQ PO TBCR: EXTENDED_RELEASE_TABLET | ORAL | 2 refills | Status: DC

## 2021-09-26 ENCOUNTER — Encounter (HOSPITAL_COMMUNITY): Payer: Self-pay | Admitting: Physician Assistant

## 2021-09-26 ENCOUNTER — Ambulatory Visit (HOSPITAL_COMMUNITY)
Admission: RE | Admit: 2021-09-26 | Discharge: 2021-09-26 | Disposition: A | Discharge status: Home / Self Care | Payer: Medicare Other | Source: Ambulatory Visit | Attending: Physician Assistant | Admitting: Physician Assistant

## 2021-09-26 ENCOUNTER — Other Ambulatory Visit: Payer: Self-pay

## 2021-09-26 VITALS — BP 130/80 | HR 48 | Ht 66.0 in | Wt 175.6 lb

## 2021-09-26 DIAGNOSIS — I4819 Other persistent atrial fibrillation: Secondary | ICD-10-CM | POA: Insufficient documentation

## 2021-09-26 DIAGNOSIS — I10 Essential (primary) hypertension: Secondary | ICD-10-CM | POA: Insufficient documentation

## 2021-09-26 DIAGNOSIS — R001 Bradycardia, unspecified: Secondary | ICD-10-CM | POA: Insufficient documentation

## 2021-09-26 DIAGNOSIS — R319 Hematuria, unspecified: Secondary | ICD-10-CM | POA: Insufficient documentation

## 2021-09-26 DIAGNOSIS — Z885 Allergy status to narcotic agent status: Secondary | ICD-10-CM | POA: Insufficient documentation

## 2021-09-26 DIAGNOSIS — I5022 Chronic systolic (congestive) heart failure: Secondary | ICD-10-CM | POA: Diagnosis not present

## 2021-09-26 DIAGNOSIS — Z7901 Long term (current) use of anticoagulants: Secondary | ICD-10-CM | POA: Diagnosis not present

## 2021-09-26 DIAGNOSIS — Z8249 Family history of ischemic heart disease and other diseases of the circulatory system: Secondary | ICD-10-CM | POA: Diagnosis not present

## 2021-09-26 DIAGNOSIS — D6869 Other thrombophilia: Secondary | ICD-10-CM | POA: Diagnosis not present

## 2021-09-26 DIAGNOSIS — Z79899 Other long term (current) drug therapy: Secondary | ICD-10-CM | POA: Diagnosis not present

## 2021-09-26 MED ORDER — METOPROLOL SUCCINATE ER 100 MG PO TB24: 50.0000 mg | ORAL_TABLET | Freq: Every day | ORAL | 2 refills | Status: DC

## 2021-09-26 NOTE — Patient Instructions (Signed)
Decreased Metoprolol 50mg  daily

## 2021-09-26 NOTE — Progress Notes (Signed)
Primary Care Physician: [name], MD Primary Cardiologist: Dr Bettina Gavia Primary Electrophysiologist: Dr Curt Bears Referring Physician: Dr Alfredo Batty Briana Barrett is a 75 y.o. female with a history of HTN, HLD, DM, HFmrEF, and atrial fibrillation who presents for consultation in the [facility]. Patient is on Eliquis for a CHADS2VASC score of 6. Patient underwent afib ablation with Dr Curt Bears on 08/15/21. She reported significant dyspnea with exertion starting the next day. CXR showed bilateral pleural effusions, echo did not show pericardial effusion. Her Lasix was increased with good result.   On follow up today, patient reports she is doing reasonably well. She does have mornings where she becomes fatigued and SOB while walking to the restroom. She also had an episode where she became very dizzy while walking her dog. She has been able to walk her dog several times since then. Of note, patient reports that urinalysis done at her PCP office showed hematuria. No smoking history.   Today, she denies symptoms of palpitations, chest pain, orthopnea, PND, lower extremity edema, presyncope, syncope, snoring, daytime somnolence, bleeding, or neurologic sequela. The patient is tolerating medications without difficulties and is otherwise without complaint today.    Atrial Fibrillation Risk Factors:  she does not have symptoms or diagnosis of sleep apnea. she does not have a history of rheumatic fever. she does not have a history of alcohol use.   she has a BMI of Body mass index is 28.34 kg/m.Marland Kitchen Filed Weights   09/26/21 1008  Weight: 79.7 kg     Family History  Problem Relation Age of Onset   Cancer Mother    Hypertension Mother    COPD Father    Hypertension Father    Heart failure Sister      Atrial Fibrillation Management history:  Previous antiarrhythmic drugs: none Previous cardioversions: 03/12/21 Previous ablations: 08/15/21 CHADS2VASC  score: 6 Anticoagulation history: Eliquis   Past Medical History:  Diagnosis Date   Age-related osteoporosis without current pathological fracture 01/21/2016   Last Assessment & Plan:  Relevant Hx: Course: Daily Update: Today's Plan:she feels this is stable for her   Electronically signed by: [name], NP 05/11/16 1430   Allergic rhinitis 01/21/2016   Last Assessment & Plan:  Relevant Hx: Course: Daily Update: Today's Plan:this is stable for her  Electronically signed by: [name], NP 05/11/16 1427   Anemia, iron deficiency 01/21/2016   Last Assessment & Plan:  Her last iron level was 68 and she is taking the iron daily see her CBC   Anxiety 04/13/2016   Last Assessment & Plan:  She is taking the xanax more daily and not her zoloft and she thinks it helps her more   Atrial fibrillation ([place])    Bilateral primary osteoarthritis of knee 03/25/2021   Bradycardia 07/02/2017   Calcification of lung 01/21/2016   Coronary artery calcification seen on CT scan 01/21/2016   DDD (degenerative disc disease), lumbosacral 01/21/2016   Last Assessment & Plan:  Relevant Hx: Course: Daily Update: Today's Plan:she is working again at third shift at the [employer] and she is on her feet and that is making this worse for her  Electronically signed by: [name], NP 05/11/16 1429   Diabetes mellitus type 2, controlled ([place]) 01/21/2016   Last Assessment & Plan:  She does not check her sugar as her last average was good for her    Diastolic congestive heart failure, NYHA class 2 ([place])  11/01/2018   Dyslipidemia 07/23/2016   Encounter for long-term (current) use of high-risk medication 01/21/2016   Episodic lightheadedness 07/02/2017   Gastroesophageal reflux disease without esophagitis 01/21/2016   Hiatal hernia 01/21/2016   Described as large on chest x-ray 2019   History of compression fracture of spine 01/21/2016   History of right breast cancer 01/21/2016    Hypercholesteremia    Hypertension    Hypertension, essential 08/27/2015   Last Assessment & Plan:  Her BP readings that she brings in here are up and down, she has a pending appt with cardiology to FU on this, and she has an extreme amount of stress at home as well that is contributing to this . 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   Impaired functional mobility, balance, gait, and endurance 10/19/2018   Localized edema 01/21/2016   Malaise and fatigue 01/21/2016   Last Assessment & Plan:  I really feel her S/S are tied to her BP and the heart rate and it not ideally being controlled for her with her inability to take multiple meds due to her S/e, she is frustrated with this and she stopped her amiodarone last Thursday, and she did not have any episodes since august 4-5, but is taking coreg as directed   Malignant neoplasm of right breast ([place]) 01/21/2016   Mixed hyperlipidemia 01/21/2016   Last Assessment & Plan:  Relevant Hx: Course: Daily Update: Today's Plan:update this for her fasting  Electronically signed by: [name], NP 05/11/16 1432   Moderate recurrent major depression ([place]) 01/21/2016   Last Assessment & Plan:  Relevant Hx: Course: Daily Update: Today's Plan:this has been stable for her  Electronically signed by: [name], NP 05/11/16 1432   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[place]) 09/13/2015   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   Last Assessment & Plan:  Her last TSH was normal   Osteoarthritis, generalized 01/21/2016   Last Assessment & Plan:  Relevant Hx: Course: Daily Update: Today's Plan:this is stable for her at this time  Electronically signed by: [name], NP 05/11/16 1430   Ovarian cyst, right 01/21/2016   Paroxysmal atrial flutter ([place])  07/23/2016   Overview:  Chads score equals 1, prefers aspirin only Overview:  Overview:  Chads score equals 1, prefers aspirin only   Permanent atrial fibrillation ([place]) 10/02/2020   Plantar fasciitis    Prediabetes 01/21/2016   Last Assessment & Plan:  Formatting of this note might be different from the original. She does not check her sugar as her last average was good for her   Primary insomnia 04/13/2016   Last Assessment & Plan:  Relevant Hx: Course: Daily Update: Today's Plan:this has been more difficult with her working her third shift  Electronically signed by: [name], NP 05/11/16 1433   Renal cyst, right 01/21/2016   Secondary hypercoagulable state ([place]) 08/19/2021   Severe episode of recurrent major depressive disorder, without psychotic features ([place]) 04/12/2020   Swelling 07/02/2017   Thoracic degenerative disc disease 06/16/2018   Tremor 10/21/2018   Past Surgical History:  Procedure Laterality Date   ANKLE SURGERY     ATRIAL FIBRILLATION ABLATION N/A 08/15/2021   Procedure: ATRIAL FIBRILLATION ABLATION;  Surgeon: Constance Haw, MD;  Location: [facility]  INVASIVE CV LAB;  Service: Cardiovascular;  Laterality: N/A;   BREAST SURGERY     CARDIOVERSION N/A 03/12/2021   Procedure: CARDIOVERSION;  Surgeon: Acie Fredrickson Wonda Cheng, MD;  Location: [facility];  Service: Cardiovascular;  Laterality: N/A;   CATARACT EXTRACTION     CHOLECYSTECTOMY     FOOT SURGERY     right   SHOULDER SURGERY     SKIN SURGERY  07/2020   nose   TUBAL LIGATION      Current Outpatient Medications  Medication Sig Dispense Refill   acetaminophen (TYLENOL) 650 MG CR tablet Take 650 mg by mouth every 8 (eight) hours as needed for pain.     ALPRAZolam (XANAX) 0.5 MG tablet Take 0.5 mg by mouth at bedtime.     apixaban (ELIQUIS) 5 MG TABS tablet Take 1 tablet (5 mg total) by mouth 2 (two) times daily. 180 tablet 1   furosemide (LASIX) 20 MG tablet Take 40mg  in the AM and 20mg  in the PM 270 tablet 2    losartan (COZAAR) 100 MG tablet Take 1 tablet (100 mg total) by mouth daily. 90 tablet 2   metoprolol succinate (TOPROL XL) 100 MG 24 hr tablet Take 1 tablet (100 mg total) by mouth daily. Take with or immediately following a meal. 90 tablet 2   omeprazole (PRILOSEC) 40 MG capsule Take 40 mg by mouth at bedtime.     potassium chloride SA (KLOR-CON) 20 MEQ tablet Take one tablet by mouth three times daily on Sunday, Tuesday, Thursday, and Saturday. Take two tablets by mouth daily in the morning, one tablet in the afternoon, and one tablet in the evening on Monday, Wednesday, and Friday. 306 tablet 2   trolamine salicylate (ASPERCREME) 10 % cream Apply 1 application topically daily as needed for muscle pain.     No current facility-administered medications for this encounter.    Allergies  Allergen Reactions   Amlodipine Besylate Swelling   Buprenorphine Hcl Other (See Comments)    hypotensive     Morphine And Related     hypotensive    Social History   Socioeconomic History   Marital status: Widowed    Spouse name: Not on file   Number of children: Not on file   Years of education: Not on file   Highest education level: Not on file  Occupational History   Not on file  Tobacco Use   Smoking status: Never   Smokeless tobacco: Never  Vaping Use   Vaping Use: Never used  Substance and Sexual Activity   Alcohol use: No   Drug use: No   Sexual activity: Not on file  Other Topics Concern   Not on file  Social History Narrative   ** Merged History Encounter **       Social Determinants of Health   Financial Resource Strain: High Risk   Difficulty of Paying Living Expenses: Hard  Food Insecurity: Food Insecurity Present   Worried About Running Out of Food in the Last Year: Sometimes true   Ran Out of Food in the Last Year: Sometimes true  Transportation Needs: No Transportation Needs   Lack of Transportation (Medical): No   Lack of Transportation (Non-Medical): No   Physical Activity: Not on file  Stress: Stress Concern Present   Feeling of Stress : Rather much  Social Connections: Not on file  Intimate Partner Violence: Not on file     ROS- All systems are reviewed and negative except as per the HPI above.  Physical Exam: Vitals:   09/26/21 1008  BP: 130/80  Pulse: (!) 48  Weight: 79.7 kg  Height: 5\' 6"  (1.676 m)    GEN- The patient is a well appearing elderly female, alert and oriented x 3 today.   HEENT-head normocephalic, atraumatic, sclera clear, conjunctiva pink, hearing intact, trachea midline. Lungs- Clear to ausculation bilaterally, normal work of breathing Heart- Regular rate and rhythm, bradycardia, no murmurs, rubs or gallops  GI- soft, NT, ND, + BS Extremities- no clubbing, cyanosis, or edema MS- no significant deformity or atrophy Skin- no rash or lesion Psych- euthymic mood, full affect Neuro- strength and sensation are intact   Wt Readings from Last 3 Encounters:  09/26/21 79.7 kg  08/29/21 79 kg  08/19/21 82.1 kg    EKG today demonstrates  SB, PAC Vent. rate 48 BPM PR interval 182 ms QRS duration 114 ms QT/QTcB 454/405 ms  Echo 08/19/21 demonstrated   1. Hypokinesis of the distal lateral and apical walls. . Left ventricular ejection fraction, by estimation, is 50 to 55%. The left ventricle has low normal function. Left ventricular diastolic parameters are indeterminate.   2. Right ventricular systolic function is normal. The right ventricular  size is normal.   3. Right atrial size was mildly dilated.   4. Mild mitral valve regurgitation.   5. AV is thickened, calcified with mildly restricted motion. Peak and  mean gradients through the valve are 10 and 5 mm Hg respectively  consistent with mild AS. Marland Kitchen Aortic valve regurgitation is not visualized.   6. The inferior vena cava is normal in size with <50% respiratory  variability, suggesting right atrial pressure of 8 mmHg.   Epic records are reviewed at  length today  CHA2DS2-VASc Score = 6  The patient's score is based upon: CHF History: 1 HTN History: 1 Diabetes History: 1 Stroke History: 0 Vascular Disease History: 0 Age Score: 2 Gender Score: 1      ASSESSMENT AND PLAN: 1. Persistent Atrial Fibrillation (ICD10:  I48.19) The patient's CHA2DS2-VASc score is 6, indicating a 9.7% annual risk of stroke.   S/p afib ablation with Dr Curt Bears on 08/15/21 Patient appears to be maintaining SR.  Decrease Toprol to 50 mg daily with bradycardia. Hopefully this will help bradycardia and fatigue.  Continue Eliquis 5 mg BID with no missed doses for 3 months post ablation.   2. Secondary Hypercoagulable State (ICD10:  D68.69) The patient is at significant risk for stroke/thromboembolism based upon her CHA2DS2-VASc Score of 6.  Continue Apixaban (Eliquis).   3. Chronic HFmrEF EF improved to 50-55% No signs or symptoms of fluid overload.  4. HTN Stable, no changes today.  5. Hematuria  Noted on urinalysis with PCP. Hgb WNL Advised urology evaluation. Patient would like referral to come from PCP for local urologist.  She will need to continue on anticoagulation with no missed doses for at least 3 months post ablation.    Follow up with Dr Curt Bears as scheduled.    [facility] [address] [place], [place] [address] [phone] 09/26/2021 10:17 AM

## 2021-11-07 ENCOUNTER — Telehealth: Payer: Self-pay | Admitting: Licensed Clinical Social Worker

## 2021-11-07 NOTE — Telephone Encounter (Signed)
Received a call from pt- she had her stove go out this month and so it concerned about making ends meet w/ her Ware[employer] and trailer insurance. She was inquiring if we could assist with either of those. I shared I would pass it by our team for Patient Care Fund consideration but could not promise anything since she had received some prior assistance. I shared if we could not help that we would try and connect her with additional community support.   Pt shares she is having a really hard time this year especially since it is the holidays with the loss of her husband last year. She did not have the most supportive experience w/ his hospice provider and felt that she had many missed opportunities to be with him at the very end of life. LCSW provided verbal support for pt, allowed her time to process her feelings around that event and the perceived changes in the frequency in which her family visits and comes by since her husband passed. LCSW encouraged her to reach out to a grief counseling provider. I provided her with two options unrelated to the provider she previously utilized. I encouraged her to take this evening to do something comforting and relaxing and then to take a moment to call one of the provided numbers or a family member to let them know she was having some sadness and could use some support.   I let her know that I would check in with her next week regard Patient [place] requests and to see if she had any success reaching one of the providers given. I remain available as needed.   [name], MSW, [place]  [phone]

## 2021-11-10 NOTE — Telephone Encounter (Signed)
LCSW spoke with team lead Kennyth Lose, we are able to assist 1x more w/ electric payment.  Pt bill paid, I then called her to let her know at [phone]. She answers, sounds very tearful sharing that the appliance she got worked but then went out after a bit; thankfully the person who sold it to her came and fixed it but then her car has started having challenges. She feels very overwhelmed by it all. She is very grateful for assistance and states she will work on [employer] application to turn in next week. LCSW provided verbal support for difficult circumstances and how pt continues to cope with them. I shared that I feel she still would benefit from formal mental health, specifically grief counseling. I inquired if she had called either support number provided. She states she did not; I offered to call with her while she spoke with them. Pt declines but takes the numbers again. She called [facility] and unfortunately that group leader retired in 2020 so they are no longer active. She will call [facility] this afternoon and see if potentially she would be eligible for virtual grief counseling. She was encouraged to call me if any issues or if we needed to seek alternate resources.   [name], MSW, [place]  [phone]- work cell phone (preferred) [phone]- desk phone

## 2021-11-12 ENCOUNTER — Telehealth: Payer: Self-pay | Admitting: Licensed Clinical Social Worker

## 2021-11-12 NOTE — Telephone Encounter (Signed)
LCSW left message for pt on cell phone [phone]) as we discussed to check in. No answer, requested call back if pt interested.   [name], MSW, [place]  [phone]- work cell phone (preferred) [phone]- desk phone

## 2021-11-17 ENCOUNTER — Ambulatory Visit: Payer: Medicare Other | Admitting: Cardiology

## 2021-11-17 ENCOUNTER — Encounter: Payer: Self-pay | Admitting: Cardiology

## 2021-11-17 ENCOUNTER — Other Ambulatory Visit: Payer: Self-pay

## 2021-11-17 VITALS — BP 134/76 | HR 54 | Ht 66.0 in | Wt 173.8 lb

## 2021-11-17 DIAGNOSIS — I4819 Other persistent atrial fibrillation: Secondary | ICD-10-CM | POA: Diagnosis not present

## 2021-11-17 MED ORDER — METOPROLOL SUCCINATE ER 25 MG PO TB24: 25.0000 mg | ORAL_TABLET | Freq: Every day | ORAL | 1 refills | Status: DC

## 2021-11-17 NOTE — Patient Instructions (Signed)
Medication Instructions:  Your physician has recommended you make the following change in your medication:  DECREASE Metoprolol Succinate (Toprol) to 25 mg daily  *If you need a refill on your cardiac medications before your next appointment, please call your pharmacy*   Lab Work: None ordered   Testing/Procedures: None ordered   Follow-Up: At [facility], you and your health needs are our priority.  As part of our continuing mission to provide you with exceptional heart care, we have created designated Provider Care Teams.  These Care Teams include your primary Cardiologist (physician) and Advanced Practice Providers (APPs -  Physician Assistants and Nurse Practitioners) who all work together to provide you with the care you need, when you need it.  Your next appointment:   3 month(s)  The format for your next appointment:   In Person  Provider:   Allegra Lai, MD    Thank you for choosing [facility]!!   Trinidad Curet, RN [phone]

## 2021-11-17 NOTE — Progress Notes (Signed)
Electrophysiology Office Note   Date:  11/17/2021   ID:  Briana Barrett 1945/12/25, MRN [id number]  PCP:  [name], MD  Cardiologist: Bettina Gavia Primary Electrophysiologist:  Enda Santo Meredith Leeds, MD    Chief Complaint: Atrial fibrillation   History of Present Illness: Briana Barrett is a 75 y.o. female who is being seen today for the evaluation of atrial fibrillation at the request of [name], MD. Presenting today for electrophysiology evaluation.  She has a history significant for persistent atrial fibrillation, hypertension, hyperlipidemia, type 2 diabetes, coronary artery calcifications, mild chronic systolic heart failure.  She had a cardioversion 03/12/2021 but unfortunately went back into atrial fibrillation.  She is now status post ablation 08/15/2021.  Today, denies symptoms of palpitations, chest pain, shortness of breath, orthopnea, PND, lower extremity edema, claudication, dizziness, presyncope, syncope, bleeding, or neurologic sequela. The patient is tolerating medications without difficulties.  Since her ablation she has done well.  She has not noted any further episodes of atrial fibrillation aside from potentially this past Thursday.  She says that she had a big meal and got fatigued.  She potentially felt palpitations.  She went to bed and woke up and felt well.  Aside from that, she has done well without issue.   Past Medical History:  Diagnosis Date   Age-related osteoporosis without current pathological fracture 01/21/2016   Last Assessment & Plan:  Relevant Hx: Course: Daily Update: Today's Plan:she feels this is stable for her   Electronically signed by: [name], NP 05/11/16 1430   Allergic rhinitis 01/21/2016   Last Assessment & Plan:  Relevant Hx: Course: Daily Update: Today's Plan:this is stable for her  Electronically signed by: [name], NP 05/11/16 1427   Anemia, iron deficiency 01/21/2016   Last  Assessment & Plan:  Her last iron level was 68 and she is taking the iron daily see her CBC   Anxiety 04/13/2016   Last Assessment & Plan:  She is taking the xanax more daily and not her zoloft and she thinks it helps her more   Atrial fibrillation ([place])    Bilateral primary osteoarthritis of knee 03/25/2021   Bradycardia 07/02/2017   Calcification of lung 01/21/2016   Coronary artery calcification seen on CT scan 01/21/2016   DDD (degenerative disc disease), lumbosacral 01/21/2016   Last Assessment & Plan:  Relevant Hx: Course: Daily Update: Today's Plan:she is working again at third shift at the [employer] and she is on her feet and that is making this worse for her  Electronically signed by: [name], NP 05/11/16 1429   Diabetes mellitus type 2, controlled ([place]) 01/21/2016   Last Assessment & Plan:  She does not check her sugar as her last average was good for her    Diastolic congestive heart failure, NYHA class 2 ([place]) 11/01/2018   Dyslipidemia 07/23/2016   Encounter for long-term (current) use of high-risk medication 01/21/2016   Episodic lightheadedness 07/02/2017   Gastroesophageal reflux disease without esophagitis 01/21/2016   Hiatal hernia 01/21/2016   Described as large on chest x-ray 2019   History of compression fracture of spine 01/21/2016   History of right breast cancer 01/21/2016   Hypercholesteremia    Hypertension    Hypertension, essential 08/27/2015   Last Assessment & Plan:  Her BP readings that she brings in here are up and down, she has a pending appt with cardiology to FU on this, and she has an extreme amount  of stress at home as well that is contributing to this . 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   Impaired functional mobility, balance, gait, and  endurance 10/19/2018   Localized edema 01/21/2016   Malaise and fatigue 01/21/2016   Last Assessment & Plan:  I really feel her S/S are tied to her BP and the heart rate and it not ideally being controlled for her with her inability to take multiple meds due to her S/e, she is frustrated with this and she stopped her amiodarone last Thursday, and she did not have any episodes since august 4-5, but is taking coreg as directed   Malignant neoplasm of right breast ([place]) 01/21/2016   Mixed hyperlipidemia 01/21/2016   Last Assessment & Plan:  Relevant Hx: Course: Daily Update: Today's Plan:update this for her fasting  Electronically signed by: [name], NP 05/11/16 1432   Moderate recurrent major depression ([place]) 01/21/2016   Last Assessment & Plan:  Relevant Hx: Course: Daily Update: Today's Plan:this has been stable for her  Electronically signed by: [name], NP 05/11/16 1432   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[place]) 09/13/2015   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   Last Assessment & Plan:  Her last TSH was normal   Osteoarthritis, generalized 01/21/2016   Last Assessment & Plan:  Relevant Hx: Course: Daily Update: Today's Plan:this is stable for her at this time  Electronically signed by: [name], NP 05/11/16 1430   Ovarian cyst, right 01/21/2016   Paroxysmal atrial flutter ([place]) 07/23/2016   Overview:  Chads score equals 1, prefers aspirin only Overview:  Overview:  Chads score equals 1, prefers aspirin only   Permanent atrial fibrillation ([place]) 10/02/2020   Plantar fasciitis    Prediabetes 01/21/2016   Last Assessment & Plan:  Formatting of this note might be different from the original. She does not check her sugar as her last average was good for her   Primary insomnia 04/13/2016   Last Assessment & Plan:  Relevant Hx: Course: Daily Update: Today's Plan:this has been more difficult with her working her third shift  Electronically signed by: [name], NP  05/11/16 1433   Renal cyst, right 01/21/2016   Secondary hypercoagulable state ([place]) 08/19/2021   Severe episode of recurrent major depressive disorder, without psychotic features ([place]) 04/12/2020   Swelling 07/02/2017   Thoracic degenerative disc disease 06/16/2018   Tremor 10/21/2018   Past Surgical History:  Procedure Laterality Date   ANKLE SURGERY     ATRIAL FIBRILLATION ABLATION N/A 08/15/2021   Procedure: ATRIAL FIBRILLATION ABLATION;  Surgeon: Constance Haw, MD;  Location: [facility];  Service: Cardiovascular;  Laterality: N/A;   BREAST SURGERY     CARDIOVERSION N/A 03/12/2021   Procedure: CARDIOVERSION;  Surgeon: Nahser, Wonda Cheng, MD;  Location: [facility];  Service: Cardiovascular;  Laterality: N/A;   CATARACT EXTRACTION     CHOLECYSTECTOMY     FOOT SURGERY     right   SHOULDER SURGERY     SKIN SURGERY  07/2020   nose   TUBAL LIGATION       Current Outpatient Medications  Medication Sig Dispense Refill   acetaminophen (TYLENOL) 650 MG CR tablet Take 650 mg by mouth every 8 (eight)  hours as needed for pain.     ALPRAZolam (XANAX) 0.5 MG tablet Take 0.5 mg by mouth at bedtime.     apixaban (ELIQUIS) 5 MG TABS tablet Take 1 tablet (5 mg total) by mouth 2 (two) times daily. 180 tablet 1   furosemide (LASIX) 20 MG tablet Take 40mg  in the AM and 20mg  in the PM 270 tablet 2   losartan (COZAAR) 100 MG tablet Take 1 tablet (100 mg total) by mouth daily. 90 tablet 2   metoprolol succinate (TOPROL XL) 100 MG 24 hr tablet Take 0.5 tablets (50 mg total) by mouth daily. Take with or immediately following a meal. 90 tablet 2   omeprazole (PRILOSEC) 40 MG capsule Take 40 mg by mouth at bedtime.     potassium chloride SA (KLOR-CON) 20 MEQ tablet Take one tablet by mouth three times daily on Sunday, Tuesday, Thursday, and Saturday. Take two tablets by mouth daily in the morning, one tablet in the afternoon, and one tablet in the evening on Monday, Wednesday, and Friday. 306  tablet 2   trolamine salicylate (ASPERCREME) 10 % cream Apply 1 application topically daily as needed for muscle pain.     No current facility-administered medications for this visit.    Allergies:   Amlodipine besylate, Buprenorphine hcl, and Morphine and related   Social History:  The patient  reports that she has never smoked. She has never used smokeless tobacco. She reports that she does not drink alcohol and does not use drugs.   Family History:  The patient's family history includes COPD in her father; Cancer in her mother; Heart failure in her sister; Hypertension in her father and mother.   ROS:  Please see the history of present illness.   Otherwise, review of systems is positive for none.   All other systems are reviewed and negative.   PHYSICAL EXAM: VS:  BP 134/76   Pulse (!) 54   Ht 5\' 6"  (1.676 m)   Wt 173 lb 12.8 oz (78.8 kg)   SpO2 97%   BMI 28.05 kg/m  , BMI Body mass index is 28.05 kg/m. GEN: Well nourished, well developed, in no acute distress  HEENT: normal  Neck: no JVD, carotid bruits, or masses Cardiac: RRR; no murmurs, rubs, or gallops,no edema  Respiratory:  clear to auscultation bilaterally, normal work of breathing GI: soft, nontender, nondistended, + BS MS: no deformity or atrophy  Skin: warm and dry Neuro:  Strength and sensation are intact Psych: euthymic mood, full affect  EKG:  EKG is ordered today. Personal review of the ekg ordered shows sinus rhythm, LVH, rate 54  Recent Labs: 07/29/2021: Hemoglobin 14.1; Platelets 262 08/19/2021: ALT 14 08/29/2021: BUN 16; Creatinine, Ser 0.86; NT-Pro BNP 683; Potassium 4.3; Sodium 142    Lipid Panel  No results found for: CHOL, TRIG, HDL, CHOLHDL, VLDL, LDLCALC, LDLDIRECT   Wt Readings from Last 3 Encounters:  11/17/21 173 lb 12.8 oz (78.8 kg)  09/26/21 175 lb 9.6 oz (79.7 kg)  08/29/21 174 lb 3.2 oz (79 kg)      Other studies Reviewed: Additional studies/ records that were reviewed today  include: TTE 08/19/21  Review of the above records today demonstrates:   1. Hypokinesis of the distal lateral and apical walls. . Left ventricular  ejection fraction, by estimation, is 50 to 55%. The left ventricle has low  normal function. Left ventricular diastolic parameters are indeterminate.   2. Right ventricular systolic function is normal. The right ventricular  size is normal.   3. Right atrial size was mildly dilated.   4. Mild mitral valve regurgitation.   5. AV is thickened, calcified with mildly restricted motion. Peak and  mean gradients through the valve are 10 and 5 mm Hg respectively  consistent with mild AS. Marland Kitchen Aortic valve regurgitation is not visualized.   6. The inferior vena cava is normal in size with <50% respiratory  variability, suggesting right atrial pressure of 8 mmHg.   Cardiac monitor 10/09/2020 personally reviewed persistent atrial fibrillation with good heart rate control.  Although ventricular ectopy was rare the triggered and diary events are associated with isolated PVCs.  ASSESSMENT AND PLAN:  1.  Persistent atrial fibrillation: Currently on Eliquis 5 mg twice daily.  CHA2DS2-VASc of 6.  Is now status post ablation 08/15/2021.  She fortunately remains in sinus rhythm.  Despite that, she is having some fatigue.  Heart rates are in the 50s.  We Patty Leitzke reduce her Toprol-XL to 25 mg.  2.  Monitor chronic systolic heart failure: Currently on Toprol-XL 50 mg daily, losartan 100 mg daily.  Reducing Toprol-XL as above  Current medicines are reviewed at length with the patient today.   The patient does not have concerns regarding her medicines.  The following changes were made today: Reduce Toprol-XL  Labs/ tests ordered today include:  Orders Placed This Encounter  Procedures   EKG 12-Lead      Disposition:   FU with Angus Amini 3 months  Signed, Tru Rana Meredith Leeds, MD  11/17/2021 11:49 AM     [address] [place] [id number] [phone] (office) [phone] (fax)

## 2021-11-17 NOTE — Addendum Note (Signed)
Addended by: Stanton Kidney on: 11/17/2021 03:54 PM   Modules accepted: Orders

## 2021-11-20 ENCOUNTER — Telehealth: Payer: Self-pay | Admitting: Licensed Clinical Social Worker

## 2021-11-20 NOTE — Telephone Encounter (Signed)
LCSW attempted to reach pt via telephone at [phone]. No answer, left voicemail. Also attempted home phone at [phone], no answer and did not leave additional message. Will reattempt tomorrow.   [name], MSW, [place]  [phone]- work cell phone (preferred) [phone]- desk phone'

## 2021-11-24 ENCOUNTER — Telehealth: Payer: Self-pay | Admitting: Licensed Clinical Social Worker

## 2021-11-24 NOTE — Telephone Encounter (Signed)
Additional call placed to pt at [phone].  No answer, left voicemail for pt encouraging call back as needed.   [name], MSW, [place]  [phone]- work cell phone (preferred) [phone]- desk phone

## 2021-11-27 ENCOUNTER — Other Ambulatory Visit: Payer: Medicare Other

## 2021-11-27 ENCOUNTER — Ambulatory Visit: Payer: Medicare Other | Admitting: Oncology

## 2021-11-28 ENCOUNTER — Ambulatory Visit: Payer: Medicare Other | Admitting: Cardiology

## 2021-12-08 ENCOUNTER — Ambulatory Visit: Payer: Medicare Other | Admitting: Cardiology

## 2021-12-16 ENCOUNTER — Telehealth: Payer: Self-pay | Admitting: Cardiology

## 2021-12-16 ENCOUNTER — Other Ambulatory Visit: Payer: Self-pay

## 2021-12-16 MED ORDER — APIXABAN 5 MG PO TABS: 5.0000 mg | ORAL_TABLET | Freq: Two times a day (BID) | ORAL | 1 refills | Status: DC

## 2021-12-16 MED ORDER — LOSARTAN POTASSIUM 100 MG PO TABS: 100.0000 mg | ORAL_TABLET | Freq: Every day | ORAL | 3 refills | Status: DC

## 2021-12-16 MED ORDER — FUROSEMIDE 20 MG PO TABS
ORAL_TABLET | ORAL | 2 refills | Status: DC
Start: 2021-12-16 — End: 2021-12-23

## 2021-12-16 MED ORDER — METOPROLOL SUCCINATE ER 25 MG PO TB24
25.0000 mg | ORAL_TABLET | Freq: Every day | ORAL | 2 refills | Status: DC
Start: 2021-12-16 — End: 2021-12-23

## 2021-12-16 MED ORDER — POTASSIUM CHLORIDE CRYS ER 20 MEQ PO TBCR: EXTENDED_RELEASE_TABLET | ORAL | 2 refills | Status: DC

## 2021-12-16 NOTE — Telephone Encounter (Signed)
Prescription refill request for Eliquis received. Indication: Afib  Last office visit:11/17/21 ([place])  Scr: 0.86 (08/29/21)  Age: 75 Weight: 78.8kg  Appropriate dose and refill sent to requested pharmacy.

## 2021-12-16 NOTE — Telephone Encounter (Signed)
°*  STAT* If patient is at the pharmacy, call can be transferred to refill team.   1. Which medications need to be refilled? (please list name of each medication and dose if known)  apixaban (ELIQUIS) 5 MG TABS tablet  2. Which pharmacy/location (including street and city if local pharmacy) is medication to be sent to? [facility], [place]  3. Do they need a 30 day or 90 day supply?  30 day supply

## 2021-12-18 ENCOUNTER — Telehealth: Payer: Self-pay | Admitting: Cardiology

## 2021-12-18 NOTE — Telephone Encounter (Signed)
Pt c/o BP issue: STAT if pt c/o blurred vision, one-sided weakness or slurred speech  1. What are your last 5 BP readings? 152/84 pulse 57, 158/89   168/96  2. Are you having any other symptoms (ex. Dizziness, headache, blurred vision, passed out)? Dizzy, pulse been fluciating  lowest 51 highest 70, headaches, feels funny, have to be careful when she walks  3. What is your BP issue? High blood pressure- patient wants to be seen asap

## 2021-12-18 NOTE — Telephone Encounter (Signed)
I have advised pt to keep an accurate log of her BP 1-2 hours after her medication as these readings are taken throughout the day and not necessarily after her medications as some are before. I made an appointment for pt 12/26/20. Pt states that she is lightheaded when she bends over. How do you advise?

## 2021-12-18 NOTE — Telephone Encounter (Signed)
Recommendations reviewed with pt as per Dr. Revankar's note.  Pt verbalized understanding and had no additional questions.   

## 2021-12-23 ENCOUNTER — Other Ambulatory Visit: Payer: Self-pay

## 2021-12-23 NOTE — Telephone Encounter (Signed)
Pt c/o medication issue:  1. Name of Medication apixaban (ELIQUIS) 5 MG TABS tablet [[id number]]   2. How are you currently taking this medication (dosage and times per day)? 5MG    3. Are you having a reaction (difficulty breathing--STAT)?   4. What is your medication issue? Pt called and stated that a 90 day supply was sent to her mail order and she only wants a 30 day supply because she can not afford the 90 day.   Best number [phone] a message if she does not answer

## 2021-12-26 ENCOUNTER — Ambulatory Visit: Payer: Medicare Other | Admitting: Cardiology

## 2021-12-29 ENCOUNTER — Telehealth: Payer: Self-pay | Admitting: Cardiology

## 2021-12-29 NOTE — Telephone Encounter (Signed)
Did not need this encounter °

## 2022-01-12 ENCOUNTER — Telehealth: Payer: Self-pay | Admitting: Cardiology

## 2022-01-12 MED ORDER — APIXABAN 5 MG PO TABS: 5.0000 mg | ORAL_TABLET | Freq: Two times a day (BID) | ORAL | 1 refills | Status: DC

## 2022-01-12 NOTE — Telephone Encounter (Signed)
Refill sent in at this time.

## 2022-01-12 NOTE — Telephone Encounter (Signed)
°*  STAT* If patient is at the pharmacy, call can be transferred to refill team.   1. Which medications need to be refilled? (please list name of each medication and dose if known)  apixaban (ELIQUIS) 5 MG TABS tablet  2. Which pharmacy/location (including street and city if local pharmacy) is medication to be sent to? [phone] - [place], [address] AT [address] [place]  3. Do they need a 30 day or 90 day supply? 30 with refills    Patient is having a hard time getting this medication from [facility]. She would like our office to send the rx to [facility] instead

## 2022-01-30 ENCOUNTER — Encounter: Payer: Self-pay | Admitting: Cardiology

## 2022-01-30 ENCOUNTER — Ambulatory Visit: Payer: Medicare HMO | Admitting: Cardiology

## 2022-01-30 ENCOUNTER — Other Ambulatory Visit: Payer: Self-pay

## 2022-01-30 VITALS — BP 142/74 | HR 46 | Ht 66.0 in | Wt 173.2 lb

## 2022-01-30 DIAGNOSIS — I119 Hypertensive heart disease without heart failure: Secondary | ICD-10-CM

## 2022-01-30 DIAGNOSIS — D6869 Other thrombophilia: Secondary | ICD-10-CM | POA: Diagnosis not present

## 2022-01-30 DIAGNOSIS — I480 Paroxysmal atrial fibrillation: Secondary | ICD-10-CM

## 2022-01-30 DIAGNOSIS — Z7901 Long term (current) use of anticoagulants: Secondary | ICD-10-CM

## 2022-01-30 MED ORDER — DILTIAZEM HCL 30 MG PO TABS: 30.0000 mg | ORAL_TABLET | Freq: Two times a day (BID) | ORAL | 3 refills | Status: DC | PRN

## 2022-01-30 NOTE — Progress Notes (Signed)
Cardiology Office Note:    Date:  01/30/2022   ID:  Briana Barrett, DOB [birth year], MRN [id number]  PCP:  [name], MD  Cardiologist:  Shirlee More, MD    Referring MD: [name], MD    ASSESSMENT:    1. Paroxysmal atrial fibrillation (HCC)   2. Secondary hypercoagulable state ([place])   3. Chronic anticoagulation   4. Hypertensive heart disease without heart failure    PLAN:    In order of problems listed above:  Overall she has done well after pulmonary vein isolation had 1 episode of a tachyarrhythmia documented we will use Cardizem as needed if frequent she will need an antiarrhythmic drug and I encouraged her to purchase some device to capture these episodes and they occur frequently we will place a monitor on her. Continue anticoagulation with a history of atrial fibrillation Stable BP is at target she has no edema continue her loop diuretic ARB beta-blocker check labs renal function liver function lipids    Next appointment: 6 months   Medication Adjustments/Labs and Tests Ordered: Current medicines are reviewed at length with the patient today.  Concerns regarding medicines are outlined above.  Orders Placed This Encounter  Procedures   Comprehensive metabolic panel   Lipid panel   EKG 12-Lead   Meds ordered this encounter  Medications   diltiazem (CARDIZEM) 30 MG tablet    Sig: Take 1 tablet (30 mg total) by mouth 2 (two) times daily as needed (Take if your heart rate is greater than 130 beats per minute.).    Dispense:  30 tablet    Refill:  3    Chief Complaint  Patient presents with   elevated HP   BP fluctuating    History of Present Illness:    Briana Barrett is a 76 y.o. female with a hx of paroxysmal atrial fibrillation longstanding failing cardioversion and subsequent EP catheter ablations in 08/15/2021 resuming sinus rhythm chronic anticoagulation symptomatic hypotension following COVID-19 infection and hypertensive heart  disease with heart heart failure.   Echocardiogram performed 08/19/2021 showed ejection fraction 50 to 55% low normal the right ventricle is normal in size function and systolic pressure the right atrium is mildly enlarged and there is no pericardial effusion.  She had mild mitral regurgitation with annular calcification.  She was last seen 01/29/2021 heart failure was improved compensated..  Compliance with diet, lifestyle and medications: Yes  She is doing much better no edema shortness of breath chest pain palpitation or syncope He is under intense stress at home with her grandson who lives with her and she thinks it contributed to an episode she had a few nights ago her heart rates to the 150 bpm for up to 30 minutes.  She is in sinus rhythm today. I encouraged her to purchase either a smart watch or a Fitbit so she can track capture these episodes I do not think she needs an antiarrhythmic drug but will give her a prescription for Cardizem that she can take as needed for rapid rate and if there are frequent episodes we need to use an antiarrhythmic drug and I think in her case Multaq.  She will remain anticoagulated.  Recent labs PCP 11/24/2021 negative rapid influenza A screen COVID antigen test negative Past Medical History:  Diagnosis Date   Age-related osteoporosis without current pathological fracture 01/21/2016   Last Assessment & Plan:  Relevant Hx: Course: Daily Update: Today's Plan:she feels this is stable for her  Electronically signed by: [name], NP 05/11/16 1430   Allergic rhinitis 01/21/2016   Last Assessment & Plan:  Relevant Hx: Course: Daily Update: Today's Plan:this is stable for her  Electronically signed by: [name], NP 05/11/16 1427   Anemia, iron deficiency 01/21/2016   Last Assessment & Plan:  Her last iron level was 68 and she is taking the iron daily see her CBC   Anxiety 04/13/2016   Last Assessment & Plan:  She is taking the xanax  more daily and not her zoloft and she thinks it helps her more   Atrial fibrillation ([place])    Bilateral primary osteoarthritis of knee 03/25/2021   Bradycardia 07/02/2017   Calcification of lung 01/21/2016   Coronary artery calcification seen on CT scan 01/21/2016   DDD (degenerative disc disease), lumbosacral 01/21/2016   Last Assessment & Plan:  Relevant Hx: Course: Daily Update: Today's Plan:she is working again at third shift at the [employer] and she is on her feet and that is making this worse for her  Electronically signed by: [name], NP 05/11/16 1429   Diabetes mellitus type 2, controlled ([place]) 01/21/2016   Last Assessment & Plan:  She does not check her sugar as her last average was good for her    Diastolic congestive heart failure, NYHA class 2 ([place]) 11/01/2018   Dyslipidemia 07/23/2016   Encounter for long-term (current) use of high-risk medication 01/21/2016   Episodic lightheadedness 07/02/2017   Gastroesophageal reflux disease without esophagitis 01/21/2016   Hiatal hernia 01/21/2016   Described as large on chest x-ray 2019   History of compression fracture of spine 01/21/2016   History of right breast cancer 01/21/2016   Hypercholesteremia    Hypertension    Hypertension, essential 08/27/2015   Last Assessment & Plan:  Her BP readings that she brings in here are up and down, she has a pending appt with cardiology to FU on this, and she has an extreme amount of stress at home as well that is contributing to this . 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   Impaired functional mobility, balance, gait, and endurance 10/19/2018   Labial cyst 09/09/2021   Localized edema 01/21/2016   Malaise and fatigue 01/21/2016   Last Assessment & Plan:  I really feel her S/S are  tied to her BP and the heart rate and it not ideally being controlled for her with her inability to take multiple meds due to her S/e, she is frustrated with this and she stopped her amiodarone last Thursday, and she did not have any episodes since august 4-5, but is taking coreg as directed   Malignant neoplasm of right breast ([place]) 01/21/2016   Mixed hyperlipidemia 01/21/2016   Last Assessment & Plan:  Relevant Hx: Course: Daily Update: Today's Plan:update this for her fasting  Electronically signed by: [name], NP 05/11/16 1432   Moderate recurrent major depression ([place]) 01/21/2016   Last Assessment & Plan:  Relevant Hx: Course: Daily Update: Today's Plan:this has been stable for her  Electronically signed by: [name], NP 05/11/16 1432   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[place]) 09/13/2015   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   Last Assessment & Plan:  Her last TSH was normal  Osteoarthritis, generalized 01/21/2016   Last Assessment & Plan:  Relevant Hx: Course: Daily Update: Today's Plan:this is stable for her at this time  Electronically signed by: [name], NP 05/11/16 1430   Ovarian cyst, right 01/21/2016   Paroxysmal atrial flutter ([place]) 07/23/2016   Overview:  Chads score equals 1, prefers aspirin only Overview:  Overview:  Chads score equals 1, prefers aspirin only   Permanent atrial fibrillation ([place]) 10/02/2020   Plantar fasciitis    Prediabetes 01/21/2016   Last Assessment & Plan:  Formatting of this note might be different from the original. She does not check her sugar as her last average was good for her   Primary insomnia 04/13/2016   Last Assessment & Plan:  Relevant Hx: Course: Daily Update: Today's Plan:this has been more difficult with her working her third shift  Electronically signed by: [name], NP 05/11/16 1433   Renal cyst, right 01/21/2016   Secondary hypercoagulable state ([place]) 08/19/2021   Severe episode of recurrent major  depressive disorder, without psychotic features ([place]) 04/12/2020   Swelling 07/02/2017   Thoracic degenerative disc disease 06/16/2018   Tremor 10/21/2018    Past Surgical History:  Procedure Laterality Date   ANKLE SURGERY     ATRIAL FIBRILLATION ABLATION N/A 08/15/2021   Procedure: ATRIAL FIBRILLATION ABLATION;  Surgeon: Constance Haw, MD;  Location: [place] [facility];  Service: Cardiovascular;  Laterality: N/A;   BREAST SURGERY     CARDIOVERSION N/A 03/12/2021   Procedure: CARDIOVERSION;  Surgeon: Acie Fredrickson Wonda Cheng, MD;  Location: [place];  Service: Cardiovascular;  Laterality: N/A;   CATARACT EXTRACTION     CHOLECYSTECTOMY     FOOT SURGERY     right   SHOULDER SURGERY     SKIN SURGERY  07/2020   nose   TUBAL LIGATION      Current Medications: Current Meds  Medication Sig   acetaminophen (TYLENOL) 650 MG CR tablet Take 650 mg by mouth every 8 (eight) hours as needed for pain.   ALPRAZolam (XANAX) 0.5 MG tablet Take 0.5 mg by mouth at bedtime.   apixaban (ELIQUIS) 5 MG TABS tablet Take 1 tablet (5 mg total) by mouth 2 (two) times daily.   diltiazem (CARDIZEM) 30 MG tablet Take 1 tablet (30 mg total) by mouth 2 (two) times daily as needed (Take if your heart rate is greater than 130 beats per minute.).   furosemide (LASIX) 20 MG tablet Take 40 mg by mouth in the morning. And takes 20 mg in the evening   losartan (COZAAR) 100 MG tablet Take 1 tablet (100 mg total) by mouth daily.   metoprolol succinate (TOPROL-XL) 100 MG 24 hr tablet Take 25 mg by mouth daily.   omeprazole (PRILOSEC) 40 MG capsule Take 40 mg by mouth at bedtime.   potassium chloride SA (KLOR-CON M) 20 MEQ tablet Take one tablet by mouth three times daily on Sunday, Tuesday, Thursday, and Saturday. Take two tablets by mouth daily in the morning, one tablet in the afternoon, and one tablet in the evening on Monday, Wednesday, and Friday. (Patient taking differently: Take 20 mEq by mouth See admin instructions.  Take one tablet by mouth three times daily on Sunday, Tuesday, Thursday, and Saturday. Take two tablets by mouth daily in the morning, one tablet in the afternoon, and one tablet in the evening on Monday, Wednesday, and Friday.)   trolamine salicylate (ASPERCREME) 10 % cream Apply 1 application topically daily as needed for muscle  pain.     Allergies:   Amlodipine besylate, Buprenorphine hcl, and Morphine and related   Social History   Socioeconomic History   Marital status: Widowed    Spouse name: Not on file   Number of children: Not on file   Years of education: Not on file   Highest education level: Not on file  Occupational History   Not on file  Tobacco Use   Smoking status: Never   Smokeless tobacco: Never  Vaping Use   Vaping Use: Never used  Substance and Sexual Activity   Alcohol use: No   Drug use: No   Sexual activity: Not on file  Other Topics Concern   Not on file  Social History Narrative   ** Merged History Encounter **       Social Determinants of Health   Financial Resource Strain: High Risk   Difficulty of Paying Living Expenses: Hard  Food Insecurity: Food Insecurity Present   Worried About Running Out of Food in the Last Year: Sometimes true   Ran Out of Food in the Last Year: Sometimes true  Transportation Needs: No Transportation Needs   Lack of Transportation (Medical): No   Lack of Transportation (Non-Medical): No  Physical Activity: Not on file  Stress: Stress Concern Present   Feeling of Stress : Rather much  Social Connections: Not on file     Family History: The patient's family history includes COPD in her father; Cancer in her mother; Heart failure in her sister; Hypertension in her father and mother. ROS:   Please see the history of present illness.    All other systems reviewed and are negative.  EKGs/Labs/Other Studies Reviewed:    The following studies were reviewed today:  EKG:  EKG ordered today and personally reviewed.   The ekg ordered today demonstrates sinus rhythm  Recent Labs: 07/29/2021: Hemoglobin 14.1; Platelets 262 08/19/2021: ALT 14 08/29/2021: BUN 16; Creatinine, Ser 0.86; NT-Pro BNP 683; Potassium 4.3; Sodium 142  Recent Lipid Panel No results found for: CHOL, TRIG, HDL, CHOLHDL, VLDL, LDLCALC, LDLDIRECT  Physical Exam:    VS:  BP (!) 142/74 (BP Location: Right Arm, Patient Position: Sitting)    Pulse (!) 46    Ht 5\' 6"  (1.676 m)    Wt 173 lb 3.2 oz (78.6 kg)    SpO2 96%    BMI 27.96 kg/m     Wt Readings from Last 3 Encounters:  01/30/22 173 lb 3.2 oz (78.6 kg)  11/17/21 173 lb 12.8 oz (78.8 kg)  09/26/21 175 lb 9.6 oz (79.7 kg)     GEN:  Well nourished, well developed in no acute distress HEENT: Normal NECK: No JVD; No carotid bruits LYMPHATICS: No lymphadenopathy CARDIAC: RRR, no murmurs, rubs, gallops RESPIRATORY:  Clear to auscultation without rales, wheezing or rhonchi  ABDOMEN: Soft, non-tender, non-distended MUSCULOSKELETAL:  No edema; No deformity  SKIN: Warm and dry NEUROLOGIC:  Alert and oriented x 3 PSYCHIATRIC:  Normal affect    Signed, Shirlee More, MD  01/30/2022 11:44 AM    [place]

## 2022-01-30 NOTE — Patient Instructions (Signed)
Medication Instructions:  Your physician has recommended you make the following change in your medication:  START: Cardizem 30 mg take one tablet by mouth every 12 hours as needed if your heart rate is above 130 beats per minute.  *If you need a refill on your cardiac medications before your next appointment, please call your pharmacy*   Lab Work: Your physician recommends that you return for lab work in: [place], Lipids If you have labs (blood work) drawn today and your tests are completely normal, you will receive your results only by: [place] (if you have MyChart) OR A paper copy in the mail If you have any lab test that is abnormal or we need to change your treatment, we will call you to review the results.   Testing/Procedures: None   Follow-Up: At [facility], you and your health needs are our priority.  As part of our continuing mission to provide you with exceptional heart care, we have created designated Provider Care Teams.  These Care Teams include your primary Cardiologist (physician) and Advanced Practice Providers (APPs -  Physician Assistants and Nurse Practitioners) who all work together to provide you with the care you need, when you need it.  We recommend signing up for the patient portal called "MyChart".  Sign up information is provided on this After Visit Summary.  MyChart is used to connect with patients for Virtual Visits (Telemedicine).  Patients are able to view lab/test results, encounter notes, upcoming appointments, etc.  Non-urgent messages can be sent to your provider as well.   To learn more about what you can do with MyChart, go to [email or web address].    Your next appointment:   6 month(s)  The format for your next appointment:   In Person  Provider:   Shirlee More, MD    Other Instructions   Tips to measure your blood pressure correctly  To determine whether you have hypertension, a medical professional will take a blood  pressure reading. How you prepare for the test, the position of your arm, and other factors can change a blood pressure reading by 10% or more. That could be enough to hide high blood pressure, start you on a drug you don't really need, or lead your doctor to incorrectly adjust your medications. National and international guidelines offer specific instructions for measuring blood pressure. If a doctor, nurse, or medical assistant isn't doing it right, don't hesitate to ask him or her to get with the guidelines. Here's what you can do to ensure a correct reading:  Don't drink a caffeinated beverage or smoke during the 30 minutes before the test.  Sit quietly for five minutes before the test begins.  During the measurement, sit in a chair with your feet on the floor and your arm supported so your elbow is at about heart level.  The inflatable part of the cuff should completely cover at least 80% of your upper arm, and the cuff should be placed on bare skin, not over a shirt.  Don't talk during the measurement.  Have your blood pressure measured twice, with a brief break in between. If the readings are different by 5 points or more, have it done a third time. There are times to break these rules. If you sometimes feel lightheaded when getting out of bed in the morning or when you stand after sitting, you should have your blood pressure checked while seated and then while standing to see if it falls from one position to  the next. Because blood pressure varies throughout the day, your doctor will rarely diagnose hypertension on the basis of a single reading. Instead, he or she will want to confirm the measurements on at least two occasions, usually within a few weeks of one another. The exception to this rule is if you have a blood pressure reading of 180/110 mm Hg or higher. A result this high usually calls for prompt treatment. It's also a good idea to have your blood pressure measured in both arms at least  once, since the reading in one arm (usually the right) may be higher than that in the left. A 2014 study in The American Journal of Medicine of nearly 3,400 people found average arm- to-arm differences in systolic blood pressure of about 5 points. The higher number should be used to make treatment decisions. In 2017, new guidelines from the [employer], the [employer] of [employer], and nine other health organizations lowered the diagnosis of high blood pressure to 130/80 mm Hg or higher for all adults. The guidelines also redefined the various blood pressure categories to now include normal, elevated, Stage 1 hypertension, Stage 2 hypertension, and hypertensive crisis (see "Blood pressure categories"). Blood pressure categories  Blood pressure category SYSTOLIC (upper number)  DIASTOLIC (lower number)  Normal Less than 120 mm Hg and Less than 80 mm Hg  Elevated 120-129 mm Hg and Less than 80 mm Hg  High blood pressure: Stage 1 hypertension 130-139 mm Hg or 80-89 mm Hg  High blood pressure: Stage 2 hypertension 140 mm Hg or higher or 90 mm Hg or higher  Hypertensive crisis (consult your doctor immediately) Higher than 180 mm Hg and/or Higher than 120 mm Hg  Source: American Heart Association and American Stroke Association. For more on getting your blood pressure under control, buy Controlling Your Blood Pressure, a Special Health Report from [facility]   DASH diet: Healthy eating to lower your blood pressure The DASH diet emphasizes portion size, eating a variety of foods and getting the right amount of nutrients. Discover how DASH can improve your health and lower your blood pressure. By [facility]  DASH stands for Dietary Approaches to Stop Hypertension. The DASH diet is a lifelong approach to healthy eating that's designed to help treat or prevent high blood pressure (hypertension). The DASH diet encourages you to reduce the sodium in your diet and eat a  variety of foods rich in nutrients that help lower blood pressure, such as potassium, calcium and magnesium. By following the DASH diet, you may be able to reduce your blood pressure by a few points in just two weeks. Over time, your systolic blood pressure could drop by eight to 14 points, which can make a significant difference in your health risks. Because the DASH diet is a healthy way of eating, it offers health benefits besides just lowering blood pressure. The DASH diet is also in line with dietary recommendations to prevent osteoporosis, cancer, heart disease, stroke and diabetes. DASH diet: Sodium levels The DASH diet emphasizes vegetables, fruits and low-fat dairy foods -- and moderate amounts of whole grains, fish, poultry and nuts. In addition to the standard DASH diet, there is also a lower sodium version of the diet. You can choose the version of the diet that meets your health needs: Standard DASH diet. You can consume up to 2,300 milligrams (mg) of sodium a day.  Lower sodium DASH diet. You can consume up to 1,500 mg of sodium  a day. Both versions of the DASH diet aim to reduce the amount of sodium in your diet compared with what you might get in a typical American diet, which can amount to a whopping 3,400 mg of sodium a day or more. The standard DASH diet meets the recommendation from the Dietary Guidelines for Americans to keep daily sodium intake to less than 2,300 mg a day. The American Heart Association recommends 1,500 mg a day of sodium as an upper limit for all adults. If you aren't sure what sodium level is right for you, talk to your doctor. DASH diet: What to eat Both versions of the DASH diet include lots of whole grains, fruits, vegetables and low-fat dairy products. The DASH diet also includes some fish, poultry and legumes, and encourages a small amount of nuts and seeds a few times a week.  You can eat red meat, sweets and fats in small amounts. The DASH diet is low in  saturated fat, cholesterol and total fat. Here's a look at the recommended servings from each food group for the 2,000-calorie-a-day DASH diet. Grains: 6 to 8 servings a day Grains include bread, cereal, rice and pasta. Examples of one serving of grains include 1 slice whole-wheat bread, 1 ounce dry cereal, or 1/2 cup cooked cereal, rice or pasta. Focus on whole grains because they have more fiber and nutrients than do refined grains. For instance, use brown rice instead of white rice, whole-wheat pasta instead of regular pasta and whole-grain bread instead of white bread. Look for products labeled "100 percent whole grain" or "100 percent whole wheat."  Grains are naturally low in fat. Keep them this way by avoiding butter, cream and cheese sauces. Vegetables: 4 to 5 servings a day Tomatoes, carrots, broccoli, sweet potatoes, greens and other vegetables are full of fiber, vitamins, and such minerals as potassium and magnesium. Examples of one serving include 1 cup raw leafy green vegetables or 1/2 cup cut-up raw or cooked vegetables. Don't think of vegetables only as side dishes -- a hearty blend of vegetables served over brown rice or whole-wheat noodles can serve as the main dish for a meal.  Fresh and frozen vegetables are both good choices. When buying frozen and canned vegetables, choose those labeled as low sodium or without added salt.  To increase the number of servings you fit in daily, be creative. In a stir-fry, for instance, cut the amount of meat in half and double up on the vegetables. Fruits: 4 to 5 servings a day Many fruits need little preparation to become a healthy part of a meal or snack. Like vegetables, they're packed with fiber, potassium and magnesium and are typically low in fat -- coconuts are an exception. Examples of one serving include one medium fruit, 1/2 cup fresh, frozen or canned fruit, or 4 ounces of juice. Have a piece of fruit with meals and one as a snack, then  round out your day with a dessert of fresh fruits topped with a dollop of low-fat yogurt.  Leave on edible peels whenever possible. The peels of apples, pears and most fruits with pits add interesting texture to recipes and contain healthy nutrients and fiber.  Remember that citrus fruits and juices, such as grapefruit, can interact with certain medications, so check with your doctor or pharmacist to see if they're OK for you.  If you choose canned fruit or juice, make sure no sugar is added. Dairy: 2 to 3 servings a day Milk, yogurt, cheese  and other dairy products are major sources of calcium, vitamin D and protein. But the key is to make sure that you choose dairy products that are low fat or fat-free because otherwise they can be a major source of fat -- and most of it is saturated. Examples of one serving include 1 cup skim or 1 percent milk, 1 cup low fat yogurt, or 1 1/2 ounces part-skim cheese. Low-fat or fat-free frozen yogurt can help you boost the amount of dairy products you eat while offering a sweet treat. Add fruit for a healthy twist.  If you have trouble digesting dairy products, choose lactose-free products or consider taking an over-the-counter product that contains the enzyme lactase, which can reduce or prevent the symptoms of lactose intolerance.  Go easy on regular and even fat-free cheeses because they are typically high in sodium. Lean meat, poultry and fish: 6 servings or fewer a day Meat can be a rich source of protein, B vitamins, iron and zinc. Choose lean varieties and aim for no more than 6 ounces a day. Cutting back on your meat portion will allow room for more vegetables. Trim away skin and fat from poultry and meat and then bake, broil, grill or roast instead of frying in fat.  Eat heart-healthy fish, such as salmon, herring and tuna. These types of fish are high in omega-3 fatty acids, which can help lower your total cholesterol. Nuts, seeds and legumes: 4 to 5  servings a week Almonds, sunflower seeds, kidney beans, peas, lentils and other foods in this family are good sources of magnesium, potassium and protein. They're also full of fiber and phytochemicals, which are plant compounds that may protect against some cancers and cardiovascular disease. Serving sizes are small and are intended to be consumed only a few times a week because these foods are high in calories. Examples of one serving include 1/3 cup nuts, 2 tablespoons seeds, or 1/2 cup cooked beans or peas.  Nuts sometimes get a bad rap because of their fat content, but they contain healthy types of fat -- monounsaturated fat and omega-3 fatty acids. They're high in calories, however, so eat them in moderation. Try adding them to stir-fries, salads or cereals.  Soybean-based products, such as tofu and tempeh, can be a good alternative to meat because they contain all of the amino acids your body needs to make a complete protein, just like meat. Fats and oils: 2 to 3 servings a day Fat helps your body absorb essential vitamins and helps your body's immune system. But too much fat increases your risk of heart disease, diabetes and obesity. The DASH diet strives for a healthy balance by limiting total fat to less than 30 percent of daily calories from fat, with a focus on the healthier monounsaturated fats. Examples of one serving include 1 teaspoon soft margarine, 1 tablespoon mayonnaise or 2 tablespoons salad dressing. Saturated fat and trans fat are the main dietary culprits in increasing your risk of coronary artery disease. DASH helps keep your daily saturated fat to less than 6 percent of your total calories by limiting use of meat, butter, cheese, whole milk, cream and eggs in your diet, along with foods made from lard, solid shortenings, and palm and coconut oils.  Avoid trans fat, commonly found in such processed foods as crackers, baked goods and fried items.  Read food labels on margarine and  salad dressing so that you can choose those that are lowest in saturated fat and free of  trans fat. Sweets: 5 servings or fewer a week You don't have to banish sweets entirely while following the DASH diet -- just go easy on them. Examples of one serving include 1 tablespoon sugar, jelly or jam, 1/2 cup sorbet, or 1 cup lemonade. When you eat sweets, choose those that are fat-free or low-fat, such as sorbets, fruit ices, jelly beans, hard candy, graham crackers or low-fat cookies.  Artificial sweeteners such as aspartame (NutraSweet, Equal) and sucralose (Splenda) may help satisfy your sweet tooth while sparing the sugar. But remember that you still must use them sensibly. It's OK to swap a diet cola for a regular cola, but not in place of a more nutritious beverage such as low-fat milk or even plain water.  Cut back on added sugar, which has no nutritional value but can pack on calories. DASH diet: Alcohol and caffeine Drinking too much alcohol can increase blood pressure. The Dietary Guidelines for Americans recommends that men limit alcohol to no more than two drinks a day and women to one or less. The DASH diet doesn't address caffeine consumption. The influence of caffeine on blood pressure remains unclear. But caffeine can cause your blood pressure to rise at least temporarily. If you already have high blood pressure or if you think caffeine is affecting your blood pressure, talk to your doctor about your caffeine consumption. DASH diet and weight loss While the DASH diet is not a weight-loss program, you may indeed lose unwanted pounds because it can help guide you toward healthier food choices. The DASH diet generally includes about 2,000 calories a day. If you're trying to lose weight, you may need to eat fewer calories. You may also need to adjust your serving goals based on your individual circumstances -- something your health care team can help you decide. Tips to cut back on sodium The  foods at the core of the DASH diet are naturally low in sodium. So just by following the DASH diet, you're likely to reduce your sodium intake. You also reduce sodium further by: Using sodium-free spices or flavorings with your food instead of salt  Not adding salt when cooking rice, pasta or hot cereal  Rinsing canned foods to remove some of the sodium  Buying foods labeled "no salt added," "sodium-free," "low sodium" or "very low sodium" One teaspoon of table salt has 2,325 mg of sodium. When you read food labels, you may be surprised at just how much sodium some processed foods contain. Even low-fat soups, canned vegetables, ready-to-eat cereals and sliced [place] from the local deli -- foods you may have considered healthy -- often have lots of sodium. You may notice a difference in taste when you choose low-sodium food and beverages. If things seem too bland, gradually introduce low-sodium foods and cut back on table salt until you reach your sodium goal. That'll give your palate time to adjust. Using salt-free seasoning blends or herbs and spices may also ease the transition. It can take several weeks for your taste buds to get used to less salty foods. Putting the pieces of the DASH diet together Try these strategies to get started on the DASH diet:  Change gradually. If you now eat only one or two servings of fruits or vegetables a day, try to add a serving at lunch and one at dinner. Rather than switching to all whole grains, start by making one or two of your grain servings whole grains. Increasing fruits, vegetables and whole grains gradually can also help  prevent bloating or diarrhea that may occur if you aren't used to eating a diet with lots of fiber. You can also try over-the-counter products to help reduce gas from beans and vegetables.  Reward successes and forgive slip-ups. Reward yourself with a nonfood treat for your accomplishments -- rent a movie, purchase a book or get together with  a friend. Everyone slips, especially when learning something new. Remember that changing your lifestyle is a long-term process. Find out what triggered your setback and then just pick up where you left off with the DASH diet.  Add physical activity. To boost your blood pressure lowering efforts even more, consider increasing your physical activity in addition to following the DASH diet. Combining both the DASH diet and physical activity makes it more likely that you'll reduce your blood pressure.  Get support if you need it. If you're having trouble sticking to your diet, talk to your doctor or dietitian about it. You might get some tips that will help you stick to the DASH diet. Remember, healthy eating isn't an all-or-nothing proposition. What's most important is that, on average, you eat healthier foods with plenty of variety -- both to keep your diet nutritious and to avoid boredom or extremes. And with the DASH diet, you can have both.

## 2022-01-31 LAB — COMPREHENSIVE METABOLIC PANEL
ALT: 11 IU/L (ref 0–32)
AST: 16 IU/L (ref 0–40)
Albumin/Globulin Ratio: 1.8 (ref 1.2–2.2)
Albumin: 4 g/dL (ref 3.7–4.7)
Alkaline Phosphatase: 87 IU/L (ref 44–121)
BUN/Creatinine Ratio: 14 (ref 12–28)
BUN: 13 mg/dL (ref 8–27)
Bilirubin Total: 0.6 mg/dL (ref 0.0–1.2)
CO2: 27 mmol/L (ref 20–29)
Calcium: 9.1 mg/dL (ref 8.7–10.3)
Chloride: 97 mmol/L (ref 96–106)
Creatinine, Ser: 0.94 mg/dL (ref 0.57–1.00)
Globulin, Total: 2.2 g/dL (ref 1.5–4.5)
Glucose: 98 mg/dL (ref 70–99)
Potassium: 4 mmol/L (ref 3.5–5.2)
Sodium: 135 mmol/L (ref 134–144)
Total Protein: 6.2 g/dL (ref 6.0–8.5)
eGFR: 62 mL/min/{1.73_m2} (ref >59)

## 2022-01-31 LAB — LIPID PANEL
Chol/HDL Ratio: 4.3 ratio (ref 0.0–4.4)
Cholesterol, Total: 212 mg/dL — ABNORMAL HIGH (ref 100–199)
HDL: 49 mg/dL (ref >39)
LDL Chol Calc (NIH): 148 mg/dL — ABNORMAL HIGH (ref 0–99)
Triglycerides: 84 mg/dL (ref 0–149)
VLDL Cholesterol Cal: 15 mg/dL (ref 5–40)

## 2022-02-02 ENCOUNTER — Telehealth: Payer: Self-pay

## 2022-02-02 NOTE — Telephone Encounter (Signed)
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

## 2022-02-02 NOTE — Telephone Encounter (Signed)
Left VM for pt to call back.

## 2022-02-04 ENCOUNTER — Telehealth: Payer: Self-pay

## 2022-02-04 DIAGNOSIS — E7800 Pure hypercholesterolemia, unspecified: Secondary | ICD-10-CM

## 2022-02-04 MED ORDER — ROSUVASTATIN CALCIUM 10 MG PO TABS: 10.0000 mg | ORAL_TABLET | Freq: Every day | ORAL | 3 refills | Status: DC

## 2022-02-04 NOTE — Telephone Encounter (Signed)
Spoke with patient regarding results and recommendation.  Patient verbalizes understanding and is agreeable to plan of care. Advised patient to call back with any issues or concerns.  

## 2022-02-04 NOTE — Telephone Encounter (Signed)
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

## 2022-02-12 ENCOUNTER — Other Ambulatory Visit: Payer: Self-pay

## 2022-02-12 DIAGNOSIS — E7800 Pure hypercholesterolemia, unspecified: Secondary | ICD-10-CM

## 2022-02-13 ENCOUNTER — Telehealth: Payer: Self-pay

## 2022-02-13 LAB — LIPID PANEL
Chol/HDL Ratio: 3.3 ratio (ref 0.0–4.4)
Cholesterol, Total: 162 mg/dL (ref 100–199)
HDL: 49 mg/dL (ref >39)
LDL Chol Calc (NIH): 94 mg/dL (ref 0–99)
Triglycerides: 103 mg/dL (ref 0–149)
VLDL Cholesterol Cal: 19 mg/dL (ref 5–40)

## 2022-02-13 NOTE — Telephone Encounter (Signed)
-----   Message from Richardo Priest, MD sent at 02/13/2022  7:59 AM EST ----- Regarding: FW: Good result no changes in treatment ----- Message ----- From: Lavone Neri Lab Results In Sent: 02/13/2022   5:39 AM EST To: Richardo Priest, MD

## 2022-02-13 NOTE — Telephone Encounter (Signed)
Patient notified of results.

## 2022-02-17 DIAGNOSIS — R319 Hematuria, unspecified: Secondary | ICD-10-CM

## 2022-02-17 HISTORY — DX: Hematuria, unspecified: R31.9

## 2022-03-23 ENCOUNTER — Ambulatory Visit: Payer: Medicare Other | Admitting: Cardiology

## 2022-04-10 ENCOUNTER — Telehealth: Payer: Self-pay | Admitting: Cardiology

## 2022-04-10 NOTE — Telephone Encounter (Signed)
Pt c/o BP issue: STAT if pt c/o blurred vision, one-sided weakness or slurred speech ? ?1. What are your last 5 BP readings? 154/94 hr 70; ? ?2. Are you having any other symptoms (ex. Dizziness, headache, blurred vision, passed out)? Dizziness, blurred vision (sometimes) ? ?3. What is your BP issue? high ? ?

## 2022-04-10 NOTE — Telephone Encounter (Signed)
Called patient and asked about her symptoms and she stated that she was feeling better now. Earlier in the day she was feeling dizzy and having some blurred vision. Her BP was 157/94 HR 70. Now she is not experiencing any of these symptoms and her BP is 131/82 HR 79. Patient does not have a history of strokes. I will will send a message to Dr. Agustin Cree for guidance. ?

## 2022-04-22 ENCOUNTER — Ambulatory Visit: Payer: Medicare HMO | Admitting: Cardiology

## 2022-04-22 ENCOUNTER — Encounter: Payer: Self-pay | Admitting: Cardiology

## 2022-04-22 VITALS — BP 160/80 | HR 54 | Ht 66.0 in | Wt 180.0 lb

## 2022-04-22 DIAGNOSIS — Z7901 Long term (current) use of anticoagulants: Secondary | ICD-10-CM | POA: Diagnosis not present

## 2022-04-22 DIAGNOSIS — I480 Paroxysmal atrial fibrillation: Secondary | ICD-10-CM

## 2022-04-22 DIAGNOSIS — I119 Hypertensive heart disease without heart failure: Secondary | ICD-10-CM

## 2022-04-22 NOTE — Patient Instructions (Signed)
Medication Instructions:  ?Your physician recommends that you continue on your current medications as directed. Please refer to the Current Medication list given to you today. ? ?*If you need a refill on your cardiac medications before your next appointment, please call your pharmacy* ? ? ?Lab Work: ?None ?If you have labs (blood work) drawn today and your tests are completely normal, you will receive your results only by: ?MyChart Message (if you have MyChart) OR ?A paper copy in the mail ?If you have any lab test that is abnormal or we need to change your treatment, we will call you to review the results. ? ? ?Testing/Procedures: ?None ? ? ?Follow-Up: ?At [facility], you and your health needs are our priority.  As part of our continuing mission to provide you with exceptional heart care, we have created designated Provider Care Teams.  These Care Teams include your primary Cardiologist (physician) and Advanced Practice Providers (APPs -  Physician Assistants and Nurse Practitioners) who all work together to provide you with the care you need, when you need it. ? ?We recommend signing up for the patient portal called "MyChart".  Sign up information is provided on this After Visit Summary.  MyChart is used to connect with patients for Virtual Visits (Telemedicine).  Patients are able to view lab/test results, encounter notes, upcoming appointments, etc.  Non-urgent messages can be sent to your provider as well.   ?To learn more about what you can do with MyChart, go to [email or web address].   ? ?Your next appointment:   ?6 month(s) ? ?The format for your next appointment:   ?In Person ? ?Provider:   ?Shirlee More, MD  ? ? ?Other Instructions ?May have 4 - 6 ounces of tonic water at bedtime - [place] ? ?Drop off a list of your blood pressures  ? ?Important Information About Sugar ? ? ? ? ? ? ? ? ?Healthbeat  ?Tips to measure your blood pressure correctly ? ?To determine whether you have hypertension, a  medical professional will take a blood pressure reading. How you prepare for the test, the position of your arm, and other factors can change a blood pressure reading by 10% or more. That could be enough to hide high blood pressure, start you on a drug you don't really need, or lead your doctor to incorrectly adjust your medications. ?National and international guidelines offer specific instructions for measuring blood pressure. If a doctor, nurse, or medical assistant isn't doing it right, don't hesitate to ask him or her to get with the guidelines. ?Here's what you can do to ensure a correct reading: ? Don't drink a caffeinated beverage or smoke during the 30 minutes before the test. ? Sit quietly for five minutes before the test begins. ? During the measurement, sit in a chair with your feet on the floor and your arm supported so your elbow is at about heart level. ? The inflatable part of the cuff should completely cover at least 80% of your upper arm, and the cuff should be placed on bare skin, not over a shirt. ? Don't talk during the measurement. ? Have your blood pressure measured twice, with a brief break in between. If the readings are different by 5 points or more, have it done a third time. ?There are times to break these rules. If you sometimes feel lightheaded when getting out of bed in the morning or when you stand after sitting, you should have your blood pressure checked while seated and then  while standing to see if it falls from one position to the next. ?Because blood pressure varies throughout the day, your doctor will rarely diagnose hypertension on the basis of a single reading. Instead, he or she will want to confirm the measurements on at least two occasions, usually within a few weeks of one another. The exception to this rule is if you have a blood pressure reading of 180/110 mm Hg or higher. A result this high usually calls for prompt treatment. ?It's also a good idea to have your blood  pressure measured in both arms at least once, since the reading in one arm (usually the right) may be higher than that in the left. A 2014 study in The American Journal of Medicine of nearly 3,400 people found average arm- to-arm differences in systolic blood pressure of about 5 points. The higher number should be used to make treatment decisions. ?In 2017, new guidelines from the [employer], the [employer] of Cardiology, and nine other health organizations lowered the diagnosis of high blood pressure to 130/80 mm Hg or higher for all adults. The guidelines also redefined the various blood pressure categories to now include normal, elevated, Stage 1 hypertension, Stage 2 hypertension, and hypertensive crisis (see "Blood pressure categories"). ?Blood pressure categories  ?Blood pressure category SYSTOLIC ?(upper number)  DIASTOLIC ?(lower number)  ?Normal Less than 120 mm Hg and Less than 80 mm Hg  ?Elevated 120-129 mm Hg and Less than 80 mm Hg  ?High blood pressure: Stage 1 hypertension 130-139 mm Hg or 80-89 mm Hg  ?High blood pressure: Stage 2 hypertension 140 mm Hg or higher or 90 mm Hg or higher  ?Hypertensive crisis (consult your doctor immediately) Higher than 180 mm Hg and/or Higher than 120 mm Hg  ?Source: American Heart Association and American Stroke Association. ?For more on getting your blood pressure under control, buy Controlling Your Blood Pressure, a Special Health Report from [facility].  ?

## 2022-04-22 NOTE — Progress Notes (Signed)
?Cardiology Office Note:   ? ?Date:  04/22/2022  ? ?ID:  Briana Barrett, DOB [birth year], MRN [id number] ? ?PCP:  [name], MD  ?Cardiologist:  Shirlee More, MD   ? ?Referring MD: [name], MD  ? ? ?ASSESSMENT:   ? ?1. Hypertensive heart disease without heart failure   ?2. Paroxysmal atrial fibrillation (HCC)   ?3. Chronic anticoagulation   ? ?PLAN:   ? ?In order of problems listed above: ? ?Initially I thought this was more of a blood pressure heart rhythm visit I think obviously grief and depression is a big problem here.  She was educated in good technique for checking her blood pressure resting to avoid variability checking the same time once or twice a day and keeping a log and bring it to the office in a few weeks.  In the interim continue her current treatment she now takes her ARB losartan at bedtime is slow that her blood pressures Toprol-XL at bedtime and takes her loop diuretic in the morning. ?Stable continue current treatment no breakthrough episodes and continue her current anticoagulant ? ? ?Next appointment: 6 months ? ? ?Medication Adjustments/Labs and Tests Ordered: ?Current medicines are reviewed at length with the patient today.  Concerns regarding medicines are outlined above.  ?No orders of the defined types were placed in this encounter. ? ?No orders of the defined types were placed in this encounter. ? ? ?Chief Complaint  ?Patient presents with  ? Hypertension  ? ? ?History of Present Illness:   ? ?Briana Barrett is a 76 y.o. female with a hx of paroxysmal atrial fibrillation longstanding failing cardioversion and subsequent EP catheter ablations in 08/15/2021 resuming sinus rhythm chronic anticoagulation symptomatic hypotension following COVID-19 infection and hypertensive heart disease with heart heart failure.   ?Echocardiogram performed 08/19/2021 showed ejection fraction 50 to 55% low normal the right ventricle is normal in size function and systolic pressure the  right atrium is mildly enlarged and there is no pericardial effusion.  She had mild mitral regurgitation with annular calcification.  ?She was seen 01/29/2021 heart failure was improved compensated.Marland Kitchen  ?She was last seen 01/30/2022. ? ?Compliance with diet, lifestyle and medications: Yes ? ?Call my office we will plan for a visit her complaint is variable blood pressures and feeling lightheaded.  Her blood pressures range from 137 and 145/80 1-80 8 - her highest 166/103 lowest 117/73 ?She is adjusted timing of her medication states helped her ?The visit quickly shifts and she becomes quite tearful she seems very depressed missing her husband who died a year ago and she has been in touch with her primary care physician I told her I thought she should discuss whether she needs antidepressant treatment ?No palpitations syncope. ?No bleeding from her anticoagulant ?Most recent labs 02/12/2022 cholesterol 162 LDL 94 hemoglobin 14.1 creatinine 0.85 potassium 4.3 ? ?Past Medical History:  ?Diagnosis Date  ? Age-related osteoporosis without current pathological fracture 01/21/2016  ? Last Assessment & Plan:  Relevant Hx: Course: Daily Update: Today's Plan:she feels this is stable for her   Electronically signed by: [name], NP 05/11/16 1430  ? Allergic rhinitis 01/21/2016  ? Last Assessment & Plan:  Relevant Hx: Course: Daily Update: Today's Plan:this is stable for her  Electronically signed by: [name], NP 05/11/16 1427  ? Anemia, iron deficiency 01/21/2016  ? Last Assessment & Plan:  Her last iron level was 68 and she is taking the iron daily see her CBC  ?  Anxiety 04/13/2016  ? Last Assessment & Plan:  She is taking the xanax more daily and not her zoloft and she thinks it helps her more  ? Atrial fibrillation ([place])   ? Bilateral primary osteoarthritis of knee 03/25/2021  ? Bradycardia 07/02/2017  ? Calcification of lung 01/21/2016  ? Coronary artery calcification seen on CT scan 01/21/2016  ?  DDD (degenerative disc disease), lumbosacral 01/21/2016  ? Last Assessment & Plan:  Relevant Hx: Course: Daily Update: Today's Plan:she is working again at third shift at the [employer] and she is on her feet and that is making this worse for her  Electronically signed by: [name], NP 05/11/16 1429  ? Diabetes mellitus type 2, controlled ([place]) 01/21/2016  ? Last Assessment & Plan:  She does not check her sugar as her last average was good for her   ? Diastolic congestive heart failure, NYHA class 2 ([place]) 11/01/2018  ? Dyslipidemia 07/23/2016  ? Encounter for long-term (current) use of high-risk medication 01/21/2016  ? Episodic lightheadedness 07/02/2017  ? Gastroesophageal reflux disease without esophagitis 01/21/2016  ? Hiatal hernia 01/21/2016  ? Described as large on chest x-ray 2019  ? History of compression fracture of spine 01/21/2016  ? History of right breast cancer 01/21/2016  ? Hypercholesteremia   ? Hypertension   ? Hypertension, essential 08/27/2015  ? Last Assessment & Plan:  Her BP readings that she brings in here are up and down, she has a pending appt with cardiology to FU on this, and she has an extreme amount of stress at home as well that is contributing to this . She and I talked about her BP meds and she is taking a low dose of the clonidine but at this time she wants to monitor this, she is aware of how to take the losartan and is taki  ? Hypertensive heart disease 09/12/2020  ? 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 Impaired functional mobility, balance, gait, and endurance 10/19/2018  ? Labial cyst 09/09/2021  ? Localized edema 01/21/2016  ? Malaise and fatigue 01/21/2016  ? Last Assessment & Plan:  I really feel her S/S are tied to her BP and the heart rate and it not ideally being controlled for her with her inability to take multiple meds due to her S/e, she is frustrated with this and she stopped her amiodarone last Thursday, and she did not have any episodes since august 4-5, but is  taking coreg as directed  ? Malignant neoplasm of right breast ([place]) 01/21/2016  ? Mixed hyperlipidemia 01/21/2016  ? Last Assessment & Plan:  Relevant Hx: Course: Daily Update: Today's Plan:update this for her fasting  Electronically signed by: [name], NP 05/11/16 1432  ? Moderate recurrent major depression ([place]) 01/21/2016  ? Last Assessment & Plan:  Relevant Hx: Course: Daily Update: Today's Plan:this has been stable for her  Electronically signed by: [name], NP 05/11/16 1432  ? 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 Last Assessment & Plan:  Her last TSH was normal  ? Osteoarthritis, generalized 01/21/2016  ? Last Assessment & Plan:  Relevant Hx: Course: Daily Update: Today's Plan:this is stable for her at this time  Electronically signed by: [name], NP 05/11/16 1430  ? Ovarian cyst, right 01/21/2016  ? Paroxysmal atrial flutter ([place]) 07/23/2016  ? Overview:  Chads score equals 1, prefers aspirin only Overview:  Overview:  Chads score equals 1, prefers aspirin only  ?  Permanent atrial fibrillation ([place]) 10/02/2020  ? Plantar fasciitis   ? Prediabetes 01/21/2016  ? Last Assessment & Plan:  Formatting of this note might be different from the original. She does not check her sugar as her last average was good for her  ? Primary insomnia 04/13/2016  ? Last Assessment & Plan:  Relevant Hx: Course: Daily Update: Today's Plan:this has been more difficult with her working her third shift  Electronically signed by: [name], NP 05/11/16 1433  ? Renal cyst, right 01/21/2016  ? Secondary hypercoagulable state ([place]) 08/19/2021  ? Severe episode of recurrent major depressive disorder, without psychotic features ([place]) 04/12/2020  ? Swelling 07/02/2017  ? 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 Tremor 10/21/2018  ? ? ?Past Surgical History:  ?Procedure Laterality Date  ? ANKLE SURGERY    ? ATRIAL FIBRILLATION ABLATION N/A  08/15/2021  ? Procedure: ATRIAL FIBRILLATION ABLATION;  Surgeon: Constance Haw, MD;  Location: [place] [facility];  Service: Cardiovascular;  Laterality: N/A;  ? BREAST SURGERY    ? CARDIOVERSION N/A 3

## 2022-04-24 ENCOUNTER — Ambulatory Visit: Payer: Medicare HMO | Admitting: Oncology

## 2022-04-24 NOTE — Progress Notes (Deleted)
Briana Barrett  [address] [place],  [name] [phone]  Clinic Day:  05/01/2021  Referring physician: [name], MD  This document serves as a record of services personally performed by Briana Hamlett Macarthur Critchley, MD. It was created on their behalf by [name] E, a trained medical scribe. The creation of this record is based on the scribe's personal observations and the provider's statements to them.  HISTORY OF PRESENT ILLNESS:  The patient is a 76 y.o. female with stage IA (T1a N0 M0) hormone positive breast cancer, status post a lumpectomy in November 2013.  She also has a history of iron deficiency anemia, for which IV and oral iron were effective in replenishing her iron stores.  She comes in today for her annual follow-up.  Since her last visit, the patient has been doing well.  She denies having any changes in her breasts which concern her for disease recurrence.  From an iron deficiency anemia standpoint, she denies having increased fatigue or any overt forms of blood loss.  Of note, her mammogram done last week showed benign calcifications for which short-term follow-up was recommended.     PHYSICAL EXAM:  There were no vitals taken for this visit. Wt Readings from Last 3 Encounters:  04/22/22 180 lb (81.6 kg)  01/30/22 173 lb 3.2 oz (78.6 kg)  11/17/21 173 lb 12.8 oz (78.8 kg)   There is no height or weight on file to calculate BMI. Performance status (ECOG): 0 - Asymptomatic Physical Exam Constitutional:      Appearance: Normal appearance. She is not ill-appearing.  HENT:     Mouth/Throat:     Mouth: Mucous membranes are moist.     Pharynx: Oropharynx is clear. No oropharyngeal exudate or posterior oropharyngeal erythema.  Cardiovascular:     Rate and Rhythm: Normal rate and regular rhythm.     Heart sounds: No murmur heard.   No friction rub. No gallop.  Pulmonary:     Effort: Pulmonary effort is normal. No respiratory  distress.     Breath sounds: Normal breath sounds. No wheezing, rhonchi or rales.  Abdominal:     General: Bowel sounds are normal. There is no distension.     Palpations: Abdomen is soft. There is no mass.     Tenderness: There is no abdominal tenderness.  Musculoskeletal:        General: No swelling.     Right lower leg: No edema.     Left lower leg: No edema.  Lymphadenopathy:     Cervical: No cervical adenopathy.     Upper Body:     Right upper body: No supraclavicular or axillary adenopathy.     Left upper body: No supraclavicular or axillary adenopathy.     Lower Body: No right inguinal adenopathy. No left inguinal adenopathy.  Skin:    General: Skin is warm.     Coloration: Skin is not jaundiced.     Findings: No lesion or rash.  Neurological:     General: No focal deficit present.     Mental Status: She is alert and oriented to person, place, and time. Mental status is at baseline.  Psychiatric:        Mood and Affect: Mood normal.        Behavior: Behavior normal.        Thought Content: Thought content normal.    STUDIES: EXAM: 04/24/2021 DIGITAL DIAGNOSTIC UNILATERAL RIGHT MAMMOGRAM WITH TOMOSYNTHESIS AND CAD  FINDINGS:  Magnified views are performed of calcifications in the UPPER INNER QUADRANT of the RIGHT breast. On magnified views there is a group of coarse dystrophic calcifications spanning 0.5 x 0.5 x 0.3 centimeters. No suspicious morphology or distribution of calcifications identified. Calcifications appear stable. No new or suspicious findings in the RIGHT breast.  IMPRESSION: Stable, likely benign RIGHT breast calcifications.  LABS:      Latest Ref Rng & Units 07/29/2021    1:41 PM 03/07/2021   10:49 AM 11/26/2020   12:00 AM  CBC  WBC 3.4 - 10.8 x10E3/uL 5.5   6.4   13.2       Hemoglobin 11.1 - 15.9 g/dL 14.1   14.7   14.7       Hematocrit 34.0 - 46.6 % 44.9   45.9   45       Platelets 150 - 450 x10E3/uL 262   268   239          This result is  from an external source.       Latest Ref Rng & Units 01/30/2022   10:59 AM 08/29/2021    1:17 PM 08/19/2021   10:45 AM  CMP  Glucose 70 - 99 mg/dL 98   77   118    BUN 8 - 27 mg/dL '13   16   13    '$ Creatinine 0.57 - 1.00 mg/dL 0.94   0.86   0.96    Sodium 134 - 144 mmol/L 135   142   137    Potassium 3.5 - 5.2 mmol/L 4.0   4.3   3.4    Chloride 96 - 106 mmol/L 97   101   103    CO2 20 - 29 mmol/L '27   28   27    '$ Calcium 8.7 - 10.3 mg/dL 9.1   9.5   8.2    Total Protein 6.0 - 8.5 g/dL 6.2    5.8    Total Bilirubin 0.0 - 1.2 mg/dL 0.6    1.1    Alkaline Phos 44 - 121 IU/L 87    57    AST 0 - 40 IU/L 16    16    ALT 0 - 32 IU/L 11    14      Lab Results  Component Value Date   TIBC 359 11/26/2020   FERRITIN 54.8 11/26/2020   IRONPCTSAT 21.7 11/26/2020     Ref. Range 11/26/2020 00:00  Iron Unknown 78  TIBC Unknown 359  %SAT Unknown 21.7  Ferritin Unknown 54.8    ASSESSMENT & PLAN:   Assessment/Plan:  A 76 y.o. female with stage IA (T1a N0 M0) hormone positive breast cancer, status post a lumpectomy in November 2013.  Based upon her clinical breast exam today and recent mammography, she appears to be disease free.  She is already scheduled for a repeat mammogram in 6 months for her continued disease surveillance.  From a hematologic standpoint, her recent hemoglobin and iron levels remain fine.  I will see her back in 1 year for repeat clinical assessment.  The patient understands all the plans discussed today and is in agreement with them.     I, Rita Ohara, am acting as scribe for Marice Potter, MD    I have reviewed this report as typed by the medical scribe, and it is complete and accurate.  Briana Aber Macarthur Critchley, MD

## 2022-04-27 ENCOUNTER — Ambulatory Visit: Payer: Self-pay | Admitting: Cardiology

## 2022-05-01 ENCOUNTER — Ambulatory Visit: Payer: Medicare HMO | Admitting: Oncology

## 2022-06-25 ENCOUNTER — Other Ambulatory Visit: Payer: Self-pay

## 2022-06-25 DIAGNOSIS — I4821 Permanent atrial fibrillation: Secondary | ICD-10-CM

## 2022-06-25 DIAGNOSIS — I4892 Unspecified atrial flutter: Secondary | ICD-10-CM

## 2022-06-25 MED ORDER — APIXABAN 5 MG PO TABS: 5.0000 mg | ORAL_TABLET | Freq: Two times a day (BID) | ORAL | 2 refills | Status: DC

## 2022-06-25 NOTE — Telephone Encounter (Signed)
Eliquis '5mg'$  refill request received. Patient is 76 years old, weight-81.6kg, Crea-0.94 on 01/30/2022, Diagnosis-Afib, and last seen by Dr. Bettina Gavia on 04/22/2022. Dose is appropriate based on dosing criteria. Will send in refill to requested pharmacy.

## 2022-07-06 ENCOUNTER — Ambulatory Visit: Payer: Self-pay | Admitting: Cardiology

## 2022-07-19 ENCOUNTER — Other Ambulatory Visit: Payer: Self-pay | Admitting: Cardiology

## 2022-08-12 ENCOUNTER — Telehealth: Payer: Self-pay | Admitting: Cardiology

## 2022-08-12 NOTE — Telephone Encounter (Signed)
Spoke with pt and advised that we can get her some samples. Pt will complete pt assistance again to see if she qualifies.

## 2022-08-12 NOTE — Telephone Encounter (Signed)
Pt c/o medication issue:  1. Name of Medication: apixaban (ELIQUIS) 5 MG TABS tablet  2. How are you currently taking this medication (dosage and times per day)? Take 1 tablet (5 mg total) by mouth 2 (two) times daily.  3. Are you having a reaction (difficulty breathing--STAT)? no  4. What is your medication issue? Patient is in the donut whole with the insurance. Calling to see if she can get samples or prescribe another medication. Please advise

## 2022-08-18 ENCOUNTER — Other Ambulatory Visit: Payer: Self-pay

## 2022-08-18 ENCOUNTER — Other Ambulatory Visit: Payer: Self-pay | Admitting: Cardiology

## 2022-08-18 MED ORDER — OMEPRAZOLE 40 MG PO CPDR: 40.0000 mg | DELAYED_RELEASE_CAPSULE | Freq: Every day | ORAL | 3 refills | Status: AC

## 2022-08-18 MED ORDER — POTASSIUM CHLORIDE CRYS ER 20 MEQ PO TBCR: EXTENDED_RELEASE_TABLET | ORAL | 2 refills | Status: DC

## 2022-08-18 MED ORDER — FUROSEMIDE 20 MG PO TABS: ORAL_TABLET | ORAL | 3 refills | Status: DC

## 2022-08-18 MED ORDER — METOPROLOL SUCCINATE ER 25 MG PO TB24: 25.0000 mg | ORAL_TABLET | Freq: Every day | ORAL | 3 refills | Status: DC

## 2022-08-19 ENCOUNTER — Telehealth: Payer: Self-pay

## 2022-08-19 NOTE — Telephone Encounter (Signed)
Appeal faxed to [facility] for Eliquis, waiting determination

## 2022-08-20 ENCOUNTER — Encounter: Payer: Self-pay | Admitting: Cardiology

## 2022-08-20 ENCOUNTER — Ambulatory Visit: Payer: Medicare HMO | Attending: Cardiology | Admitting: Cardiology

## 2022-08-20 VITALS — BP 140/80 | HR 70 | Ht 66.0 in | Wt 186.8 lb

## 2022-08-20 DIAGNOSIS — I480 Paroxysmal atrial fibrillation: Secondary | ICD-10-CM

## 2022-08-20 DIAGNOSIS — I119 Hypertensive heart disease without heart failure: Secondary | ICD-10-CM | POA: Diagnosis not present

## 2022-08-20 DIAGNOSIS — Z7901 Long term (current) use of anticoagulants: Secondary | ICD-10-CM | POA: Diagnosis not present

## 2022-08-20 MED ORDER — ASPIRIN 81 MG PO TBEC: 81.0000 mg | DELAYED_RELEASE_TABLET | Freq: Every day | ORAL | 3 refills | Status: DC

## 2022-08-20 NOTE — Patient Instructions (Signed)
Medication Instructions:  Your physician has recommended you make the following change in your medication:   Stay on Eliquis until you can get a Fitbit, Smart watch or Samsung, then stop Eliquis and start Aspirin 81 mg daily.   *If you need a refill on your cardiac medications before your next appointment, please call your pharmacy*   Lab Work: None If you have labs (blood work) drawn today and your tests are completely normal, you will receive your results only by: [place] (if you have MyChart) OR A paper copy in the mail If you have any lab test that is abnormal or we need to change your treatment, we will call you to review the results.   Testing/Procedures: None   Follow-Up: At [facility], you and your health needs are our priority.  As part of our continuing mission to provide you with exceptional heart care, we have created designated Provider Care Teams.  These Care Teams include your primary Cardiologist (physician) and Advanced Practice Providers (APPs -  Physician Assistants and Nurse Practitioners) who all work together to provide you with the care you need, when you need it.  We recommend signing up for the patient portal called "MyChart".  Sign up information is provided on this After Visit Summary.  MyChart is used to connect with patients for Virtual Visits (Telemedicine).  Patients are able to view lab/test results, encounter notes, upcoming appointments, etc.  Non-urgent messages can be sent to your provider as well.   To learn more about what you can do with MyChart, go to [email or web address].    Your next appointment:   6 month(s)  The format for your next appointment:   In Person  Provider:   Shirlee More, MD    Other Instructions None  Important Information About Sugar

## 2022-08-20 NOTE — Progress Notes (Signed)
Cardiology Office Note:    Date:  08/20/2022   ID:  Briana Barrett, DOB [birth year], MRN [id number]  PCP:  [name], MD  Cardiologist:  Shirlee More, MD    Referring MD: [name], MD    ASSESSMENT:    1. Paroxysmal atrial fibrillation (HCC)   2. Chronic anticoagulation   3. Hypertensive heart disease without heart failure    PLAN:    In order of problems listed above:  She continues to do well after atrial fibrillation ablation we will do an EKG to ensure she is not having recurrence and unfortunately be forced to transition aspirin with active surveillance through a smart watch at least through her donut hole pharmacy benefits.  If she has possible or definitive A-fib on the watch she will stop her aspirin and go back to anticoagulant.  I offered her warfarin she declined Able blood pressures are running in the range of 140/80 and she feels better she had lightheadedness when she had systolics less than 867   Next appointment: 6 months   Medication Adjustments/Labs and Tests Ordered: Current medicines are reviewed at length with the patient today.  Concerns regarding medicines are outlined above.  Orders Placed This Encounter  Procedures   EKG 12-Lead   Meds ordered this encounter  Medications   aspirin EC 81 MG tablet    Sig: Take 1 tablet (81 mg total) by mouth daily. Swallow whole.    Dispense:  90 tablet    Refill:  3    Chief Complaint  Patient presents with   Follow-up   Atrial Fibrillation    History of Present Illness:    Briana Barrett is a 76 y.o. female with a hx of paroxysmal atrial fibrillation longstanding failing cardioversion and subsequent EP catheter ablations in 08/15/2021 resuming sinus rhythm chronic anticoagulation symptomatic hypotension following COVID-19 infection and hypertensive heart disease with heart heart failure.    Echocardiogram performed 08/19/2021 showed ejection fraction 50 to 55% low normal the right  ventricle is normal in size function and systolic pressure the right atrium is mildly enlarged and there is no pericardial effusion.  She had mild mitral regurgitation with annular calcification.    She was last seen 04/22/2022.  Compliance with diet, lifestyle and medications: Yes   She is worked into my office hours today because of difficulty affording accessing Eliquis  Most recent labs 07/27/2022 potassium 3.9 sodium 139 creatinine 0.94 GFR 62 cc Last LDL 04/12/2020 [facility]  She was unable to get patient assistance We discussed the option of transition to warfarin she does not want to do that because of the risk of bleeding and the need for dietary restriction and frequent lab work She has maintained sinus rhythm since her ablation and I told her if we could use a smart watch to assure ourselves she has no recurrent atrial fibrillation we could transition to aspirin especially during the donut hole. She will keep tablets of Eliquis and of her watch tells her that she has atrial fibrillation or possible she will go back on it and stop aspirin Her daughter and granddaughter who is a nurse will set her up for the smart watch and atrial fibrillation surveillance Otherwise doing well no edema shortness of breath chest pain palpitation or syncope Past Medical History:  Diagnosis Date   Age-related osteoporosis without current pathological fracture 01/21/2016   Last Assessment & Plan:  Relevant Hx: Course: Daily Update: Today's Plan:she feels this is stable for her  Electronically signed by: [name], NP 05/11/16 1430   Allergic rhinitis 01/21/2016   Last Assessment & Plan:  Relevant Hx: Course: Daily Update: Today's Plan:this is stable for her  Electronically signed by: [name], NP 05/11/16 1427   Anemia, iron deficiency 01/21/2016   Last Assessment & Plan:  Her last iron level was 68 and she is taking the iron daily see her CBC   Anxiety 04/13/2016   Last  Assessment & Plan:  She is taking the xanax more daily and not her zoloft and she thinks it helps her more   Atrial fibrillation ([place])    Bilateral primary osteoarthritis of knee 03/25/2021   Bradycardia 07/02/2017   Calcification of lung 01/21/2016   Coronary artery calcification seen on CT scan 01/21/2016   DDD (degenerative disc disease), lumbosacral 01/21/2016   Last Assessment & Plan:  Relevant Hx: Course: Daily Update: Today's Plan:she is working again at third shift at the [employer] and she is on her feet and that is making this worse for her  Electronically signed by: [name], NP 05/11/16 1429   Diabetes mellitus type 2, controlled ([place]) 01/21/2016   Last Assessment & Plan:  She does not check her sugar as her last average was good for her    Diastolic congestive heart failure, NYHA class 2 ([place]) 11/01/2018   Dyslipidemia 07/23/2016   Encounter for long-term (current) use of high-risk medication 01/21/2016   Episodic lightheadedness 07/02/2017   Gastroesophageal reflux disease without esophagitis 01/21/2016   Hiatal hernia 01/21/2016   Described as large on chest x-ray 2019   History of compression fracture of spine 01/21/2016   History of right breast cancer 01/21/2016   Hypercholesteremia    Hypertension    Hypertension, essential 08/27/2015   Last Assessment & Plan:  Her BP readings that she brings in here are up and down, she has a pending appt with cardiology to FU on this, and she has an extreme amount of stress at home as well that is contributing to this . 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   Impaired functional mobility, balance, gait, and endurance 10/19/2018   Labial cyst 09/09/2021   Localized edema 01/21/2016   Malaise and fatigue 01/21/2016   Last  Assessment & Plan:  I really feel her S/S are tied to her BP and the heart rate and it not ideally being controlled for her with her inability to take multiple meds due to her S/e, she is frustrated with this and she stopped her amiodarone last Thursday, and she did not have any episodes since august 4-5, but is taking coreg as directed   Malignant neoplasm of right breast ([place]) 01/21/2016   Mixed hyperlipidemia 01/21/2016   Last Assessment & Plan:  Relevant Hx: Course: Daily Update: Today's Plan:update this for her fasting  Electronically signed by: [name], NP 05/11/16 1432   Moderate recurrent major depression ([place]) 01/21/2016   Last Assessment & Plan:  Relevant Hx: Course: Daily Update: Today's Plan:this has been stable for her  Electronically signed by: [name], NP 05/11/16 1432   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[place]) 09/13/2015   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   Last Assessment & Plan:  Her last TSH was normal  Osteoarthritis, generalized 01/21/2016   Last Assessment & Plan:  Relevant Hx: Course: Daily Update: Today's Plan:this is stable for her at this time  Electronically signed by: [name], NP 05/11/16 1430   Ovarian cyst, right 01/21/2016   Paroxysmal atrial flutter ([place]) 07/23/2016   Overview:  Chads score equals 1, prefers aspirin only Overview:  Overview:  Chads score equals 1, prefers aspirin only   Permanent atrial fibrillation ([place]) 10/02/2020   Plantar fasciitis    Prediabetes 01/21/2016   Last Assessment & Plan:  Formatting of this note might be different from the original. She does not check her sugar as her last average was good for her   Primary insomnia 04/13/2016   Last Assessment & Plan:  Relevant Hx: Course: Daily Update: Today's Plan:this has been more difficult with her working her third shift  Electronically signed by: [name], NP 05/11/16 1433   Renal cyst, right 01/21/2016   Secondary hypercoagulable state ([place])  08/19/2021   Severe episode of recurrent major depressive disorder, without psychotic features ([place]) 04/12/2020   Swelling 07/02/2017   Thoracic degenerative disc disease 06/16/2018   Tremor 10/21/2018    Past Surgical History:  Procedure Laterality Date   ANKLE SURGERY     ATRIAL FIBRILLATION ABLATION N/A 08/15/2021   Procedure: ATRIAL FIBRILLATION ABLATION;  Surgeon: Constance Haw, MD;  Location: [place] [facility];  Service: Cardiovascular;  Laterality: N/A;   BREAST SURGERY     CARDIOVERSION N/A 03/12/2021   Procedure: CARDIOVERSION;  Surgeon: Acie Fredrickson Wonda Cheng, MD;  Location: [facility];  Service: Cardiovascular;  Laterality: N/A;   CATARACT EXTRACTION     CHOLECYSTECTOMY     FOOT SURGERY     right   SHOULDER SURGERY     SKIN SURGERY  07/2020   nose   TUBAL LIGATION      Current Medications: Current Meds  Medication Sig   acetaminophen (TYLENOL) 650 MG CR tablet Take 650 mg by mouth every 8 (eight) hours as needed for pain.   ALPRAZolam (XANAX) 0.5 MG tablet Take 0.5 mg by mouth at bedtime.   apixaban (ELIQUIS) 5 MG TABS tablet Take 1 tablet (5 mg total) by mouth 2 (two) times daily.   aspirin EC 81 MG tablet Take 1 tablet (81 mg total) by mouth daily. Swallow whole.   diltiazem (CARDIZEM) 30 MG tablet Take 1 tablet (30 mg total) by mouth 2 (two) times daily as needed (Take if your heart rate is greater than 130 beats per minute.).   furosemide (LASIX) 20 MG tablet TAKE 2 TABLETS BY MOUTH IN THE MORNING AND TAKE 1 TABLET IN THE EVENING   losartan (COZAAR) 100 MG tablet Take 1 tablet (100 mg total) by mouth daily.   metoprolol succinate (TOPROL-XL) 25 MG 24 hr tablet Take 1 tablet (25 mg total) by mouth daily.   omeprazole (PRILOSEC) 40 MG capsule Take 1 capsule (40 mg total) by mouth at bedtime.   potassium chloride SA (KLOR-CON M) 20 MEQ tablet Take one tablet by mouth three times daily on Sunday, Tuesday, Thursday, and Saturday. Take two tablets by mouth daily in the  morning, one tablet in the afternoon, and one tablet in the evening on Monday, Wednesday, and Friday.   rosuvastatin (CRESTOR) 10 MG tablet Take 1 tablet (10 mg total) by mouth daily.   trolamine salicylate (ASPERCREME) 10 % cream Apply 1 application topically daily as needed for muscle pain.     Allergies:   Amlodipine besylate,  Buprenorphine hcl, and Morphine and related   Social History   Socioeconomic History   Marital status: Widowed    Spouse name: Not on file   Number of children: Not on file   Years of education: Not on file   Highest education level: Not on file  Occupational History   Not on file  Tobacco Use   Smoking status: Never   Smokeless tobacco: Never  Vaping Use   Vaping Use: Never used  Substance and Sexual Activity   Alcohol use: No   Drug use: No   Sexual activity: Not on file  Other Topics Concern   Not on file  Social History Narrative   ** Merged History Encounter **       Social Determinants of Health   Financial Resource Strain: High Risk (07/28/2021)   Overall Financial Resource Strain (CARDIA)    Difficulty of Paying Living Expenses: Hard  Food Insecurity: Food Insecurity Present (07/28/2021)   Hunger Vital Sign    Worried About Running Out of Food in the Last Year: Sometimes true    Ran Out of Food in the Last Year: Sometimes true  Transportation Needs: No Transportation Needs (07/28/2021)   PRAPARE - Hydrologist (Medical): No    Lack of Transportation (Non-Medical): No  Physical Activity: Not on file  Stress: Stress Concern Present (07/28/2021)   [place]    Feeling of Stress : Rather much  Social Connections: Not on file     Family History: The patient's family history includes COPD in her father; Cancer in her mother; Heart failure in her sister; Hypertension in her father and mother. ROS:   Please see the history of present illness.    All  other systems reviewed and are negative.  EKGs/Labs/Other Studies Reviewed:    The following studies were reviewed today:  EKG:  EKG ordered today and personally reviewed.  The ekg ordered today demonstrates sinus rhythm incomplete left bundle branch block  Recent Labs: 08/29/2021: NT-Pro BNP 683 01/30/2022: ALT 11; BUN 13; Creatinine, Ser 0.94; Potassium 4.0; Sodium 135  Recent Lipid Panel    Component Value Date/Time   CHOL 162 02/12/2022 1052   TRIG 103 02/12/2022 1052   HDL 49 02/12/2022 1052   CHOLHDL 3.3 02/12/2022 1052   LDLCALC 94 02/12/2022 1052    Physical Exam:    VS:  BP (!) 140/80   Pulse 70   Ht '5\' 6"'$  (1.676 m)   Wt 186 lb 12.8 oz (84.7 kg)   SpO2 97%   BMI 30.15 kg/m     Wt Readings from Last 3 Encounters:  08/20/22 186 lb 12.8 oz (84.7 kg)  04/22/22 180 lb (81.6 kg)  01/30/22 173 lb 3.2 oz (78.6 kg)     GEN:  Well nourished, well developed in no acute distress HEENT: Normal NECK: No JVD; No carotid bruits LYMPHATICS: No lymphadenopathy CARDIAC: RRR, no murmurs, rubs, gallops RESPIRATORY:  Clear to auscultation without rales, wheezing or rhonchi  ABDOMEN: Soft, non-tender, non-distended MUSCULOSKELETAL:  No edema; No deformity  SKIN: Warm and dry NEUROLOGIC:  Alert and oriented x 3 PSYCHIATRIC:  Normal affect    Signed, Shirlee More, MD  08/20/2022 11:34 AM    [place]

## 2022-08-21 DIAGNOSIS — T466X5A Adverse effect of antihyperlipidemic and antiarteriosclerotic drugs, initial encounter: Secondary | ICD-10-CM

## 2022-08-21 DIAGNOSIS — M791 Myalgia, unspecified site: Secondary | ICD-10-CM | POA: Insufficient documentation

## 2022-08-21 HISTORY — DX: Adverse effect of antihyperlipidemic and antiarteriosclerotic drugs, initial encounter: T46.6X5A

## 2022-10-05 ENCOUNTER — Ambulatory Visit: Payer: Medicare HMO | Admitting: Cardiology

## 2022-12-02 ENCOUNTER — Telehealth: Payer: Self-pay | Admitting: *Deleted

## 2022-12-02 NOTE — Telephone Encounter (Signed)
Pt has OV 12/07/22. Clearance will be addressed at that time. Appt notes updated. Will await pharmacy recommendations prior to removing from preop pool.  Loel Dubonnet, NP

## 2022-12-02 NOTE — Telephone Encounter (Signed)
   Pre-operative Risk Assessment    Patient Name: Briana Barrett  DOB: [birth year] MRN: [id number]      Request for Surgical Clearance    Procedure:   LEFT ARTHROSCOPIC MEDIAL MENISCECTOMY   Date of Surgery:  Clearance TBD                                 Surgeon:  DR. Joya Salm Surgeon's Group or Practice Name:  [facility] Phone number:  [phone] Fax number:  [phone]   Type of Clearance Requested:   - Medical  - Pharmacy:  Hold Apixaban (Eliquis)     Type of Anesthesia:  General    Additional requests/questions:    Jiles Prows   12/02/2022, 9:54 AM

## 2022-12-02 NOTE — Telephone Encounter (Signed)
Patient on Eliquis for PAF. Will route to pharmacy team for input.    CHA2DS2-VASc Score = 7   This indicates a 11.2% annual risk of stroke. The patient's score is based upon: CHF History: 1 HTN History: 1 Diabetes History: 1 Stroke History: 0 Vascular Disease History: 1 (coronary calcification on CT) Age Score: 2 Gender Score: 1     Platelet count: No results found for requested labs within last 365 days.   Creatinine clearance: 52m/min (adjusted for weight)  02/25/22 creatinine 0.85,  01/30/2022: Creatinine, Ser 0.94     CLoel Dubonnet NP  12/02/22  10:13 AM

## 2022-12-03 NOTE — Telephone Encounter (Signed)
Patient with diagnosis of afib/aflutter on Eliquis for anticoagulation.    Procedure: LEFT ARTHROSCOPIC MEDIAL MENISCECTOMY   Date of procedure: TBD  CHA2DS2-VASc Score = 7  This indicates a 11.2% annual risk of stroke. The patient's score is based upon: CHF History: 1 HTN History: 1 Diabetes History: 1 Stroke History: 0 Vascular Disease History: 1 (coronary calcification on CT) Age Score: 2 Gender Score: 1   CrCl 90m/min using adjusted body weight Platelet count 230K  Per office protocol, patient can hold Eliquis for 3 days prior to procedure. Resume as soon as safely possible after given elevated CV risk.  **This guidance is not considered finalized until pre-operative APP has relayed final recommendations.**

## 2022-12-07 ENCOUNTER — Encounter: Payer: Self-pay | Admitting: Cardiology

## 2022-12-07 ENCOUNTER — Ambulatory Visit: Payer: Medicare HMO | Attending: Cardiology | Admitting: Cardiology

## 2022-12-07 VITALS — BP 142/86 | HR 70 | Ht 65.0 in | Wt 181.6 lb

## 2022-12-07 DIAGNOSIS — D6869 Other thrombophilia: Secondary | ICD-10-CM

## 2022-12-07 DIAGNOSIS — R0609 Other forms of dyspnea: Secondary | ICD-10-CM | POA: Diagnosis not present

## 2022-12-07 DIAGNOSIS — I4819 Other persistent atrial fibrillation: Secondary | ICD-10-CM | POA: Diagnosis not present

## 2022-12-07 NOTE — Patient Instructions (Addendum)
Medication Instructions:  Your physician recommends that you continue on your current medications as directed. Please refer to the Current Medication list given to you today.  *If you need a refill on your cardiac medications before your next appointment, please call your pharmacy*   Lab Work: None ordered   Testing/Procedures: Your physician has requested that you have a lexiscan myoview. For further information please visit [email or web address]. Please follow instruction sheet, as given.   Follow-Up: At [facility], you and your health needs are our priority.  As part of our continuing mission to provide you with exceptional heart care, we have created designated Provider Care Teams.  These Care Teams include your primary Cardiologist (physician) and Advanced Practice Providers (APPs -  Physician Assistants and Nurse Practitioners) who all work together to provide you with the care you need, when you need it.  Your next appointment:   6 month(s)  The format for your next appointment:   In Person  Provider:   Allegra Lai, MD    Thank you for choosing [facility]!!   Trinidad Curet, RN [phone]  Other Instructions  Your physician has requested that you have a lexiscan myoview. For further information please visit [email or web address].   How to Prepare for Your Myoview Test:        A. Nothing to eat or drink 2 hours prior to arrival time, except you may drink water       B. No Caffeine/Decaffeinated products or chocolate 12 hours prior to arrival.       C. No Cologne or Lotion       D. Wear comfortable walking shoes       E. Total time is 3 to 4 hours; you may want to bring reading material for the waiting               time. If someone comes with you, they will need to remain in the lobby due to                  limited space in the testing area.        F. Please report to [address], [address] for your test.        G. Medication Instructions:    After You Arrive:  Once you arrive in the Nuclear Cardiology lab, an IV will be started, then the Technologist will inject a small amount of radioactive tracer. There will be a 1 hour waiting period after this injection. A series of pictures will be taken of your heart following this waiting period. You will be prepped for the stress portion of the test. During the stress portion of your test a small amount of radioactive tracer will be injected in your IV. After the stress portion, there is a short rest period during which time your heart and blood pressure will be monitored. After the short test period the Technologist will begin your second set of pictures. Your doctor will inform you of your test results within 7 days.  In preparation for your appointment, medication, and supplies will be purchased. Appointment availability is limited, so if you need to cancel or reschedule please call the Nuclear Department at [phone], 24 hours in advance to avoid a cancellation fee of $50.00   Important Information About Sugar

## 2022-12-07 NOTE — Progress Notes (Signed)
Electrophysiology Office Note   Date:  12/07/2022   ID:  Briana Barrett [birth year], MRN [id number]  PCP:  [name], MD  Cardiologist: Bettina Gavia Primary Electrophysiologist:  Suman Trivedi Meredith Leeds, MD    Chief Complaint: Atrial fibrillation   History of Present Illness: Briana Barrett is a 76 y.o. female who is being seen today for the evaluation of atrial fibrillation at the request of [name], MD. Presenting today for electrophysiology evaluation.  She has a history significant for persistent atrial fibrillation, hypertension, hyperlipidemia, type 2 diabetes, coronary artery calcifications, mild systolic heart failure.  She had a complete and atrial fibrillation ablation 08/15/2021.  Today, denies symptoms of palpitations, chest pain, shortness of breath, orthopnea, PND, lower extremity edema, claudication, dizziness, presyncope, syncope, bleeding, or neurologic sequela. The patient is tolerating medications without difficulties.  She continues to have episodes of palpitations.  These are short-lived and rare, once every few months.  She is overall happy with her control.  She feels that she cannot necessarily climb a flight of stairs due to shortness of breath, though she can walk on flat ground without issue.   Past Medical History:  Diagnosis Date   Age-related osteoporosis without current pathological fracture 01/21/2016   Last Assessment & Plan:  Relevant Hx: Course: Daily Update: Today's Plan:she feels this is stable for her   Electronically signed by: Briana Camel, NP 05/11/16 1430   Allergic rhinitis 01/21/2016   Last Assessment & Plan:  Relevant Hx: Course: Daily Update: Today's Plan:this is stable for her  Electronically signed by: Briana Camel, NP 05/11/16 1427   Anemia, iron deficiency 01/21/2016   Last Assessment & Plan:  Her last iron level was 68 and she is taking the iron daily see her CBC   Anxiety 04/13/2016    Last Assessment & Plan:  She is taking the xanax more daily and not her zoloft and she thinks it helps her more   Atrial fibrillation ([place])    Bilateral primary osteoarthritis of knee 03/25/2021   Bradycardia 07/02/2017   Calcification of lung 01/21/2016   Coronary artery calcification seen on CT scan 01/21/2016   DDD (degenerative disc disease), lumbosacral 01/21/2016   Last Assessment & Plan:  Relevant Hx: Course: Daily Update: Today's Plan:she is working again at third shift at the [employer] and she is on her feet and that is making this worse for her  Electronically signed by: Briana Camel, NP 05/11/16 1429   Diabetes mellitus type 2, controlled ([place]) 01/21/2016   Last Assessment & Plan:  She does not check her sugar as her last average was good for her    Diastolic congestive heart failure, NYHA class 2 ([place]) 11/01/2018   Dyslipidemia 07/23/2016   Encounter for long-term (current) use of high-risk medication 01/21/2016   Episodic lightheadedness 07/02/2017   Gastroesophageal reflux disease without esophagitis 01/21/2016   Hiatal hernia 01/21/2016   Described as large on chest x-ray 2019   History of compression fracture of spine 01/21/2016   History of right breast cancer 01/21/2016   Hypercholesteremia    Hypertension    Hypertension, essential 08/27/2015   Last Assessment & Plan:  Her BP readings that she brings in here are up and down, she has a pending appt with cardiology to FU on this, and she has an extreme amount of stress at home as well that is contributing to this . 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   Impaired functional mobility, balance, gait, and endurance 10/19/2018   Labial cyst 09/09/2021   Localized edema 01/21/2016   Malaise and fatigue 01/21/2016   Last  Assessment & Plan:  I really feel her S/S are tied to her BP and the heart rate and it not ideally being controlled for her with her inability to take multiple meds due to her S/e, she is frustrated with this and she stopped her amiodarone last Thursday, and she did not have any episodes since august 4-5, but is taking coreg as directed   Malignant neoplasm of right breast ([place]) 01/21/2016   Mixed hyperlipidemia 01/21/2016   Last Assessment & Plan:  Relevant Hx: Course: Daily Update: Today's Plan:update this for her fasting  Electronically signed by: Briana Camel, NP 05/11/16 1432   Moderate recurrent major depression ([place]) 01/21/2016   Last Assessment & Plan:  Relevant Hx: Course: Daily Update: Today's Plan:this has been stable for her  Electronically signed by: Briana Camel, NP 05/11/16 1432   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[place]) 09/13/2015   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   Last Assessment & Plan:  Her last TSH was normal   Osteoarthritis, generalized 01/21/2016   Last Assessment & Plan:  Relevant Hx: Course: Daily Update: Today's Plan:this is stable for her at this time  Electronically signed by: Briana Camel, NP 05/11/16 1430   Ovarian cyst, right 01/21/2016   Paroxysmal atrial flutter ([place]) 07/23/2016   Overview:  Chads score equals 1, prefers aspirin only Overview:  Overview:  Chads score equals 1, prefers aspirin only   Permanent atrial fibrillation ([place]) 10/02/2020   Plantar fasciitis    Prediabetes 01/21/2016   Last Assessment & Plan:  Formatting of this note might be different from the original. She does not check her sugar as her last average was good for her   Primary insomnia 04/13/2016   Last Assessment & Plan:  Relevant Hx: Course: Daily Update: Today's Plan:this has been more difficult with her working her third shift  Electronically signed by: Briana Camel, NP 05/11/16 1433   Renal cyst, right 01/21/2016   Secondary hypercoagulable state ([place])  08/19/2021   Severe episode of recurrent major depressive disorder, without psychotic features ([place]) 04/12/2020   Swelling 07/02/2017   Thoracic degenerative disc disease 06/16/2018   Tremor 10/21/2018   Past Surgical History:  Procedure Laterality Date   ANKLE SURGERY     ATRIAL FIBRILLATION ABLATION N/A 08/15/2021   Procedure: ATRIAL FIBRILLATION ABLATION;  Surgeon: Constance Haw, MD;  Location: [facility];  Service: Cardiovascular;  Laterality: N/A;   BREAST SURGERY     CARDIOVERSION N/A 03/12/2021   Procedure: CARDIOVERSION;  Surgeon: Acie Fredrickson Wonda Cheng, MD;  Location: [facility];  Service: Cardiovascular;  Laterality: N/A;   CATARACT EXTRACTION     CHOLECYSTECTOMY     FOOT SURGERY     right   SHOULDER SURGERY     SKIN SURGERY  07/2020   nose   TUBAL LIGATION       Current Outpatient Medications  Medication Sig Dispense Refill   acetaminophen (TYLENOL) 650 MG CR tablet Take 650 mg by mouth every 8 (eight) hours as needed for pain.     ALPRAZolam (XANAX) 0.5 MG tablet Take 0.5 mg by  mouth at bedtime.     apixaban (ELIQUIS) 5 MG TABS tablet Take 1 tablet (5 mg total) by mouth 2 (two) times daily. 180 tablet 2   diltiazem (CARDIZEM) 30 MG tablet Take 15 mg by mouth 2 (two) times daily as needed. Take if your heart rate is greater than 130 beats per minute.     furosemide (LASIX) 20 MG tablet TAKE 2 TABLETS BY MOUTH IN THE MORNING AND TAKE 1 TABLET IN THE EVENING 270 tablet 3   losartan (COZAAR) 100 MG tablet Take 1 tablet (100 mg total) by mouth daily. 90 tablet 3   metoprolol succinate (TOPROL-XL) 25 MG 24 hr tablet Take 1 tablet (25 mg total) by mouth daily. 90 tablet 3   omeprazole (PRILOSEC) 40 MG capsule Take 1 capsule (40 mg total) by mouth at bedtime. 90 capsule 3   potassium chloride SA (KLOR-CON M) 20 MEQ tablet Take 20 mEq by mouth 2 (two) times daily.     trolamine salicylate (ASPERCREME) 10 % cream Apply 1 application topically daily as needed for muscle pain.      rosuvastatin (CRESTOR) 10 MG tablet Take 1 tablet (10 mg total) by mouth daily. 90 tablet 3   No current facility-administered medications for this visit.    Allergies:   Amlodipine besylate, Buprenorphine hcl, and Morphine and related   Social History:  The patient  reports that she has never smoked. She has never used smokeless tobacco. She reports that she does not drink alcohol and does not use drugs.   Family History:  The patient's family history includes COPD in her father; Cancer in her mother; Heart failure in her sister; Hypertension in her father and mother.   ROS:  Please see the history of present illness.   Otherwise, review of systems is positive for none.   All other systems are reviewed and negative.   PHYSICAL EXAM: VS:  BP (!) 142/86   Pulse 70   Ht '5\' 5"'$  (1.651 m)   Wt 181 lb 9.6 oz (82.4 kg)   SpO2 94%   BMI 30.22 kg/m  , BMI Body mass index is 30.22 kg/m. GEN: Well nourished, well developed, in no acute distress  HEENT: normal  Neck: no JVD, carotid bruits, or masses Cardiac: RRR; no murmurs, rubs, or gallops,no edema  Respiratory:  clear to auscultation bilaterally, normal work of breathing GI: soft, nontender, nondistended, + BS MS: no deformity or atrophy  Skin: warm and dry Neuro:  Strength and sensation are intact Psych: euthymic mood, full affect  EKG:  EKG is ordered today. Personal review of the ekg ordered shows sinus rhythm   Recent Labs: 01/30/2022: ALT 11; BUN 13; Creatinine, Ser 0.94; Potassium 4.0; Sodium 135    Lipid Panel     Component Value Date/Time   CHOL 162 02/12/2022 1052   TRIG 103 02/12/2022 1052   HDL 49 02/12/2022 1052   CHOLHDL 3.3 02/12/2022 1052   LDLCALC 94 02/12/2022 1052     Wt Readings from Last 3 Encounters:  12/07/22 181 lb 9.6 oz (82.4 kg)  08/20/22 186 lb 12.8 oz (84.7 kg)  04/22/22 180 lb (81.6 kg)      Other studies Reviewed: Additional studies/ records that were reviewed today include: TTE  08/19/21  Review of the above records today demonstrates:   1. Hypokinesis of the distal lateral and apical walls. . Left ventricular  ejection fraction, by estimation, is 50 to 55%. The left ventricle has low  normal function.  Left ventricular diastolic parameters are indeterminate.   2. Right ventricular systolic function is normal. The right ventricular  size is normal.   3. Right atrial size was mildly dilated.   4. Mild mitral valve regurgitation.   5. AV is thickened, calcified with mildly restricted motion. Peak and  mean gradients through the valve are 10 and 5 mm Hg respectively  consistent with mild AS. Marland Kitchen Aortic valve regurgitation is not visualized.   6. The inferior vena cava is normal in size with <50% respiratory  variability, suggesting right atrial pressure of 8 mmHg.   Cardiac monitor 10/09/2020 personally reviewed persistent atrial fibrillation with good heart rate control.  Although ventricular ectopy was rare the triggered and diary events are associated with isolated PVCs.  ASSESSMENT AND PLAN:  1.  Persistent atrial fibrillation: Currently on Eliquis 5 mg twice daily.  CHA2DS2-VASc of 6.  Post ablation 08/15/2021.  Remains in sinus rhythm with only intermittent palpitations.  No changes.  2.  Chronic systolic heart failure: Currently on Toprol-XL and losartan.  Plan per primary cardiology  3.  Secondary hypercoagulable state: Currently on Eliquis for atrial fibrillation as above  4.  Preoperative evaluation: She has plans for knee surgery.  She does get short of breath when she climbs a flight of stairs but does well on flat ground.  She has an elevated coronary calcium score based on preablation CT.  Cindi Ghazarian plan for Myoview.  If Myoview is low risk, she would be intermediate risk for an intermediate risk procedure and no further testing would be necessary.  Current medicines are reviewed at length with the patient today.   The patient does not have concerns regarding  her medicines.  The following changes were made today: none  Labs/ tests ordered today include:  Orders Placed This Encounter  Procedures   Myocardial Perfusion Imaging   EKG 12-Lead      Disposition:   FU 6 months  Signed, Erinne Gillentine Meredith Leeds, MD  12/07/2022 12:24 PM     [facility] [address] [place] [address] [phone] (office) [phone] (fax)

## 2022-12-10 ENCOUNTER — Other Ambulatory Visit: Payer: Self-pay | Admitting: Cardiology

## 2022-12-16 ENCOUNTER — Telehealth (HOSPITAL_COMMUNITY): Payer: Self-pay | Admitting: Radiology

## 2022-12-16 NOTE — Telephone Encounter (Signed)
Patient given detailed instructions per Myocardial Perfusion Study Information Sheet for the test on 12/22/2022 at 8:15. Patient notified to arrive 15 minutes early and that it is imperative to arrive on time for appointment to keep from having the test rescheduled.  If you need to cancel or reschedule your appointment, please call the office within 24 hours of your appointment. . Patient verbalized understanding.EHK

## 2022-12-22 ENCOUNTER — Telehealth: Payer: Self-pay | Admitting: Cardiology

## 2022-12-22 ENCOUNTER — Other Ambulatory Visit: Payer: Self-pay

## 2022-12-22 ENCOUNTER — Ambulatory Visit: Payer: Medicare Other | Attending: Cardiology

## 2022-12-22 DIAGNOSIS — I4821 Permanent atrial fibrillation: Secondary | ICD-10-CM

## 2022-12-22 DIAGNOSIS — R0609 Other forms of dyspnea: Secondary | ICD-10-CM | POA: Diagnosis not present

## 2022-12-22 LAB — MYOCARDIAL PERFUSION IMAGING
LV dias vol: 106 mL (ref 46–106)
LV sys vol: 38 mL
Nuc Stress EF: 64 %
Peak HR: 75 {beats}/min
Rest HR: 59 {beats}/min
Rest Nuclear Isotope Dose: 10 mCi
SDS: 2
SRS: 3
SSS: 5
Stress Nuclear Isotope Dose: 30.4 mCi
TID: 1.04

## 2022-12-22 MED ORDER — POTASSIUM CHLORIDE CRYS ER 20 MEQ PO TBCR: 20.0000 meq | EXTENDED_RELEASE_TABLET | Freq: Two times a day (BID) | ORAL | 3 refills | Status: DC

## 2022-12-22 MED ORDER — TECHNETIUM TC 99M TETROFOSMIN IV KIT
10.0000 | PACK | Freq: Once | INTRAVENOUS | Status: AC | PRN
  Administered 2022-12-22: 10 via INTRAVENOUS

## 2022-12-22 MED ORDER — APIXABAN 5 MG PO TABS: 5.0000 mg | ORAL_TABLET | Freq: Two times a day (BID) | ORAL | 2 refills | Status: DC

## 2022-12-22 MED ORDER — REGADENOSON 0.4 MG/5ML IV SOLN
0.4000 mg | Freq: Once | INTRAVENOUS | Status: AC
  Administered 2022-12-22: 0.4 mg via INTRAVENOUS

## 2022-12-22 MED ORDER — METOPROLOL SUCCINATE ER 25 MG PO TB24: 25.0000 mg | ORAL_TABLET | Freq: Every day | ORAL | 3 refills | Status: DC

## 2022-12-22 MED ORDER — LOSARTAN POTASSIUM 100 MG PO TABS: 100.0000 mg | ORAL_TABLET | Freq: Every day | ORAL | 3 refills | Status: DC

## 2022-12-22 MED ORDER — TECHNETIUM TC 99M TETROFOSMIN IV KIT
30.4000 | PACK | Freq: Once | INTRAVENOUS | Status: AC | PRN
  Administered 2022-12-22: 30.4 via INTRAVENOUS

## 2022-12-22 MED ORDER — FUROSEMIDE 20 MG PO TABS: ORAL_TABLET | ORAL | 3 refills | Status: DC

## 2022-12-22 MED ORDER — ROSUVASTATIN CALCIUM 10 MG PO TABS: 10.0000 mg | ORAL_TABLET | Freq: Every day | ORAL | 3 refills | Status: DC

## 2022-12-22 NOTE — Telephone Encounter (Signed)
Prescription refill request for Eliquis received. Indication:afib Last office visit:12/23 Scr:1.0 Age: 77 Weight:82.4  kg  Prescription refilled

## 2022-12-22 NOTE — Telephone Encounter (Signed)
*  STAT* If patient is at the pharmacy, call can be transferred to refill team.   1. Which medications need to be refilled? (please list name of each medication and dose if known)  furosemide (LASIX) 20 MG tablet  losartan (COZAAR) 100 MG tablet  potassium chloride SA (KLOR-CON M) 20 MEQ tablet  metoprolol succinate (TOPROL-XL) 25 MG 24 hr tablet  rosuvastatin (CRESTOR) 10 MG tablet apixaban (ELIQUIS) 5 MG TABS tablet  sent to [facility]  2. Which pharmacy/location (including street and city if local pharmacy) is medication to be sent to? All go to [facility] except eliquis, eliquis goes to her [facility]  3. Do they need a 30 day or 90 day supply? 90 day

## 2022-12-23 NOTE — Telephone Encounter (Signed)
Patient is following up. Medications were sent to the incorrect pharmacy. All medications except for Eliquis should have been sent to [facility]. Please transfer.

## 2022-12-23 NOTE — Telephone Encounter (Signed)
Spoke with pt regarding medications. All medications from note yesterday sent to [facility] except Eliquis- sent to [facility].

## 2022-12-24 ENCOUNTER — Telehealth: Payer: Self-pay | Admitting: Cardiology

## 2022-12-24 NOTE — Telephone Encounter (Signed)
I will forward note to pre op provider to review notes 12/02/22 clearance request and 12/07/22 Dr. Abbe Amsterdam notes. I will have pre op app review if the pt has been cleared after her stress test ordered by Dr. Curt Bears.

## 2022-12-24 NOTE — Telephone Encounter (Signed)
Office calling to f/u on clearance that was sent on 11/30/22. Please advise

## 2022-12-25 MED ORDER — LOSARTAN POTASSIUM 100 MG PO TABS: 100.0000 mg | ORAL_TABLET | Freq: Every day | ORAL | 1 refills | Status: DC

## 2022-12-25 MED ORDER — METOPROLOL SUCCINATE ER 25 MG PO TB24: 25.0000 mg | ORAL_TABLET | Freq: Every day | ORAL | 1 refills | Status: DC

## 2022-12-25 MED ORDER — POTASSIUM CHLORIDE CRYS ER 20 MEQ PO TBCR: 20.0000 meq | EXTENDED_RELEASE_TABLET | Freq: Two times a day (BID) | ORAL | 1 refills | Status: DC

## 2022-12-25 MED ORDER — ROSUVASTATIN CALCIUM 10 MG PO TABS: 10.0000 mg | ORAL_TABLET | Freq: Every day | ORAL | 1 refills | Status: DC

## 2022-12-25 MED ORDER — FUROSEMIDE 20 MG PO TABS: ORAL_TABLET | ORAL | 1 refills | Status: DC

## 2022-12-25 NOTE — Addendum Note (Signed)
Addended by: Veroncia Jezek, Jonelle Sidle L on: 12/25/2022 12:30 PM   Modules accepted: Orders

## 2022-12-25 NOTE — Telephone Encounter (Signed)
   Patient Name: Tausha Milhoan  DOB: Sep 29, 1946 MRN: [id number]  Primary Cardiologist: [name], MD  Chart reviewed as part of pre-operative protocol coverage.  Patient was seen by Dr. Curt Bears on 12/07/2022 for preoperative visit.  Per Dr. Curt Bears "she has an elevated coronary calcium score based on preablation CT. Will plan for Myoview. If Myoview is low risk, she would be intermediate risk for an intermediate risk procedure and no further testing would be necessary".  Lexiscan Myoview completed on 12/22/2022 and was low risk.  Based on Dr. Curt Bears above-mentioned recommendations and into his  Samentha Perham is at acceptable risk for the planned procedure without further cardiovascular testing.   Per office protocol, patient can hold Eliquis for 3 days prior to procedure. Resume as soon as safely possible after given elevated CV risk.   I will route this recommendation to the requesting party via Epic fax function and remove from pre-op pool.  Please call with questions.  Mable Fill, Marissa Nestle, NP 12/25/2022, 9:02 AM

## 2023-01-09 IMAGING — CT CT HEART MORPH/PULM VEIN W/ CM & W/O CA SCORE
2 of 5 series · 10 of 20 positions shown, 12 images · IV contrast (APPLIED)
Comparison: 05/07/2021 chest radiograph from [REDACTED]. CT
from [REDACTED] 09/30/2016
COMPARISON: 05/07/2021 chest radiograph from [REDACTED]. CT
from [REDACTED] 09/30/2016

Addendum:
EXAM:
OVER-READ INTERPRETATION  CT CHEST

The following report is an over-read performed by radiologist Dr.
Kaden Holzer [REDACTED] on 08/11/2021. This over-read
does not include interpretation of cardiac or coronary anatomy or
pathology. The coronary CTA interpretation by the cardiologist is
attached.
CLINICAL DATA: Atrial fibrillation scheduled for ablation.
Cardiac CTA
TECHNIQUE: A non-contrast, gated CT scan was obtained with axial slices of 3 mm
through the heart for calcium scoring. Calcium scoring was performed
using the Agatston method. A 120 kV retrospective, gated, contrast
cardiac scan was obtained. Gantry rotation speed was 250 msecs and
collimation was 0.6 mm. Nitroglycerin was not given. A delayed scan
was obtained to exclude left atrial appendage thrombus. The 3D
dataset was reconstructed in 5% intervals of the 0-95% of the R-R
cycle. Late systolic phases were analyzed on a dedicated workstation
using MPR, MIP, and VRT modes. The patient received 80 cc of
contrast.

[Series 6: best syst · axial · 0.39mm/px · z∈[+1226,+1311]mm · 7 of 283 slices shown, 9 images]
[im 36/283  vessel]
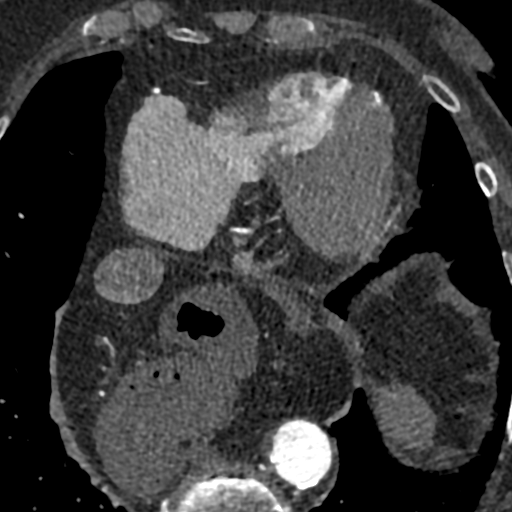
[im 36/283  lung]
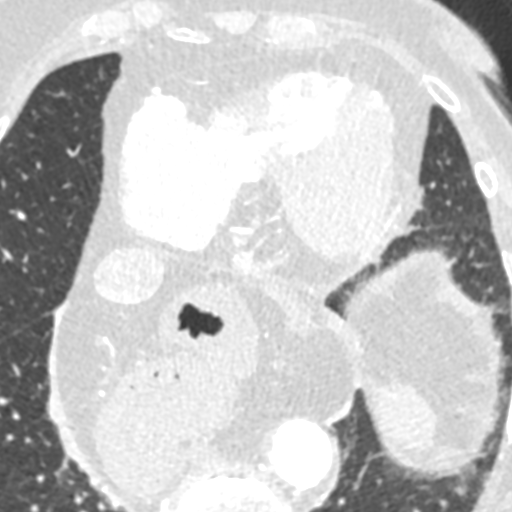
[im 71/283  vessel]
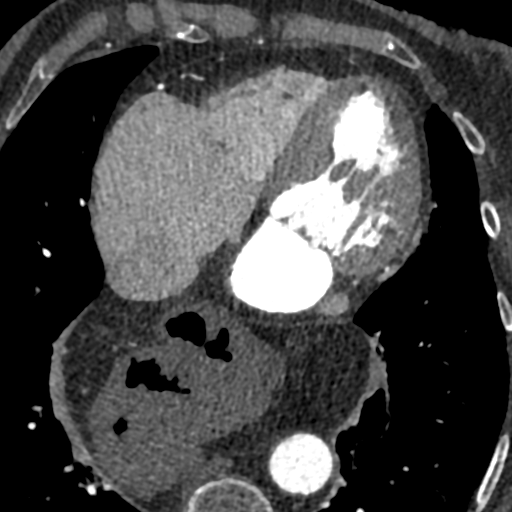
[im 106/283  vessel]
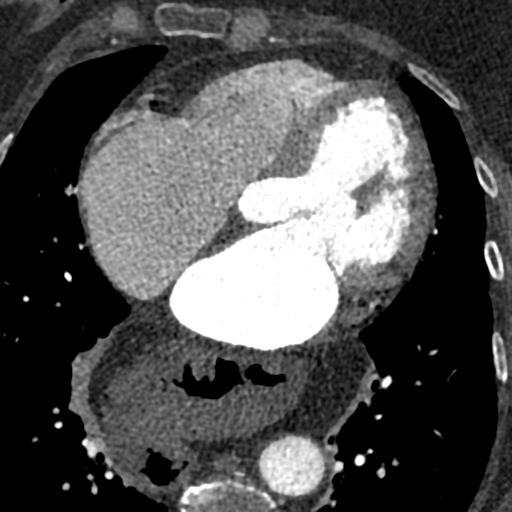
[im 142/283  vessel]
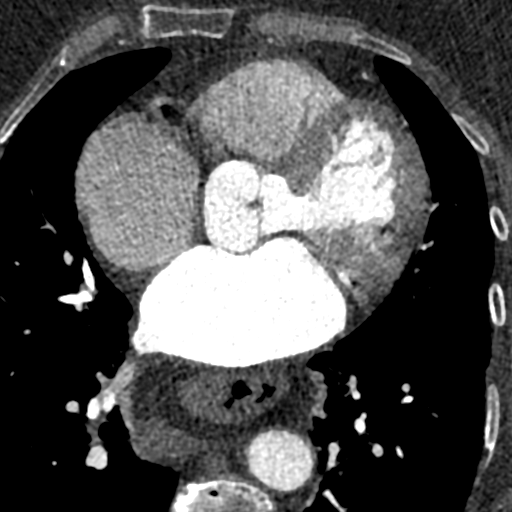
[im 177/283  vessel]
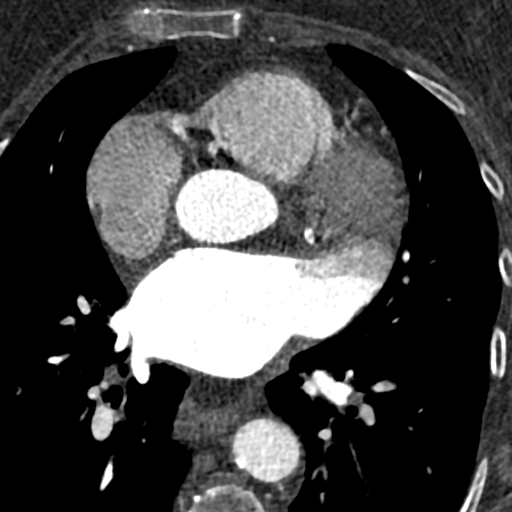
[im 177/283  lung]
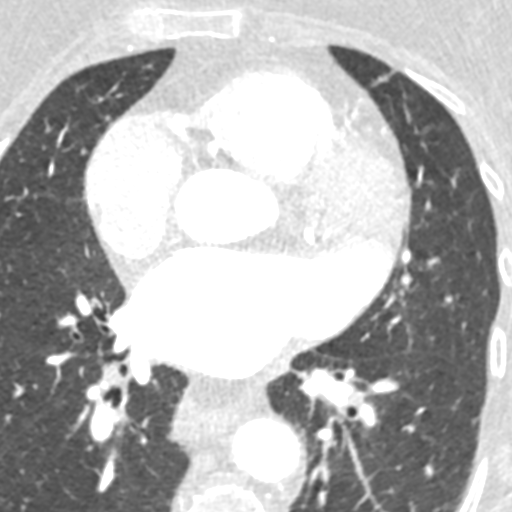
[im 212/283  vessel]
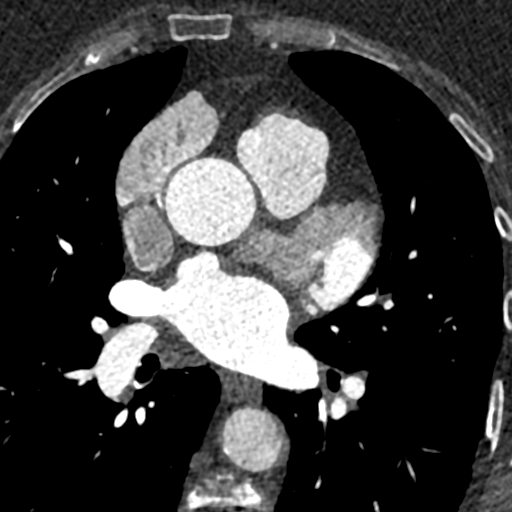
[im 247/283  vessel]
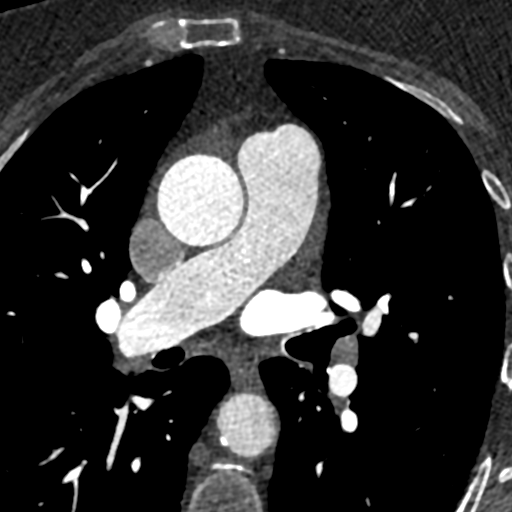

[Series 10: pv delay · axial · portal-venous · 0.39mm/px · z∈[+1270,+1306]mm · 3 of 148 slices shown]
[im 37/148  vessel]
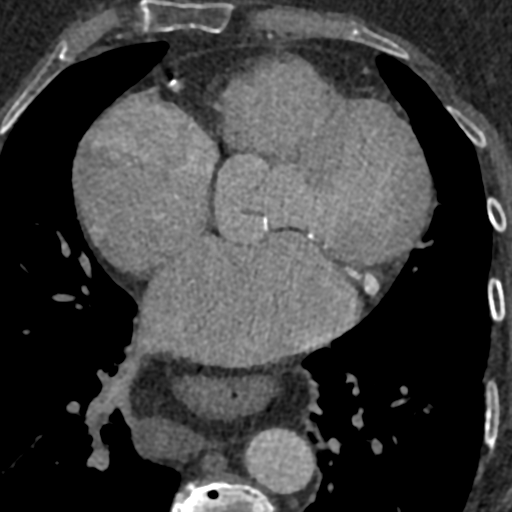
[im 74/148  vessel]
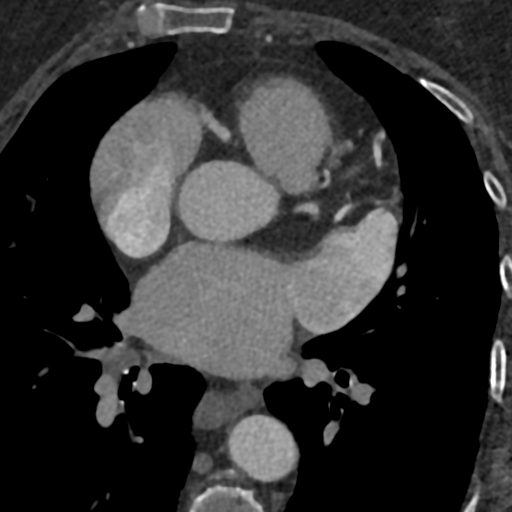
[im 111/148  vessel]
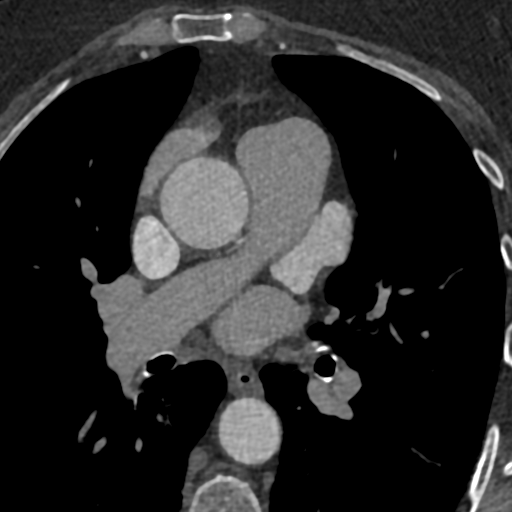

[10 of 20 positions shown; findings below may reference images not displayed]

FINDINGS: Vascular: Aortic atherosclerosis. No central pulmonary embolism, on
this non-dedicated study.

Mediastinum/Nodes: No mediastinal or hilar adenopathy. A large
hiatal hernia, with approximately [DATE] of the stomach positioned in
the chest.

Lungs/Pleura: No pleural fluid. 3 mm left lower lobe pulmonary
nodule on 40/8 is similar to on the prior and considered benign.

Upper Abdomen: Normal imaged portions of the liver, spleen.

Musculoskeletal: Osteopenia.
IMPRESSION: 1.  No acute findings in the imaged extracardiac chest.
2.  Aortic Atherosclerosis (UZS5D-5I5.5).
3. Large hiatal hernia.
FINDINGS: Image quality: Poor.

Pulmonary Veins: There is normal pulmonary vein drainage into the
left atrium (3 on the right with small right middle PV and 2 on the
left) with ostial measurements as follows:

RUPV: Ostium 14.3 x 12.4 mm  area 1.4 cm^2

RMPV:  Ostium 7.3 x 6.4 mm  area 0.35 cm^2

RLPV:  Ostium 11.2 x 9.8 mm  area 0.82 cm^2

LUPV:  Ostium 18.2 x 15 mm area 2.15 cm^2

LLPV:  Ostium 18.2 x 16.4 mm  area 2.21 cm^2

Left Atrium: The left atrial size is severely dilated. There is no
PFO/ASD. The left atrial appendage is large. There is no thrombus in
the left atrial appendage on contrast or delayed imaging. The
esophagus runs in the left atrial midline and is not in proximity to
any of the pulmonary vein ostia.

Coronary Arteries: CAC score of 562 Agatston units, which is 85th
percentile for age-, race-, and sex-matched controls. Normal
coronary origin. Right dominance. The study was performed without
use of NTG and is insufficient for plaque evaluation.

Right Atrium: Severely dilated.

Right Ventricle: The right ventricular cavity is within normal
limits.

Left Ventricle: The ventricular cavity size is within normal limits.
There are no stigmata of prior infarction. There is no abnormal
filling defect.

Pericardium: Normal thickness with no significant effusion or
calcium present.

Pulmonary Artery: Normal caliber without proximal filling defect.

Aorta: Normal caliber with no significant disease.

Extra-cardiac findings: See attached radiology report for
non-cardiac structures.
IMPRESSION: 1. There is normal pulmonary vein drainage into the left atrium with
ostial measurements above.

2. There is no thrombus in the left atrial appendage.

3. The esophagus runs in the left atrial midline and is not in
proximity to any of the pulmonary vein ostia.

4. No PFO/ASD.

5. Normal coronary origin. Right dominance.

6. CAC score of 562 Agatston units which is 85th percentile for
age-, race-, and sex-matched controls.

*** End of Addendum ***
EXAM:
OVER-READ INTERPRETATION  CT CHEST

The following report is an over-read performed by radiologist Dr.
Nindi [REDACTED] on 08/11/2021. This over-read
does not include interpretation of cardiac or coronary anatomy or
pathology. The coronary CTA interpretation by the cardiologist is
attached.
FINDINGS: Vascular: Aortic atherosclerosis. No central pulmonary embolism, on
this non-dedicated study.

Mediastinum/Nodes: No mediastinal or hilar adenopathy. A large
hiatal hernia, with approximately [DATE] of the stomach positioned in
the chest.

Lungs/Pleura: No pleural fluid. 3 mm left lower lobe pulmonary
nodule on 40/8 is similar to on the prior and considered benign.

Upper Abdomen: Normal imaged portions of the liver, spleen.

Musculoskeletal: Osteopenia.
IMPRESSION: 1.  No acute findings in the imaged extracardiac chest.
2.  Aortic Atherosclerosis (UZS5D-5I5.5).
3. Large hiatal hernia.

## 2023-01-11 DIAGNOSIS — M1712 Unilateral primary osteoarthritis, left knee: Secondary | ICD-10-CM

## 2023-01-11 HISTORY — DX: Unilateral primary osteoarthritis, left knee: M17.12

## 2023-01-17 IMAGING — CR DG CHEST 2V
2 series · 2 of 2 positions shown · non-contrast
Comparison: 08/11/2021

CLINICAL DATA: Shortness of breath. Status post ablation of atrial
fibrillation.

EXAM:
CHEST - 2 VIEW

[chest pa]
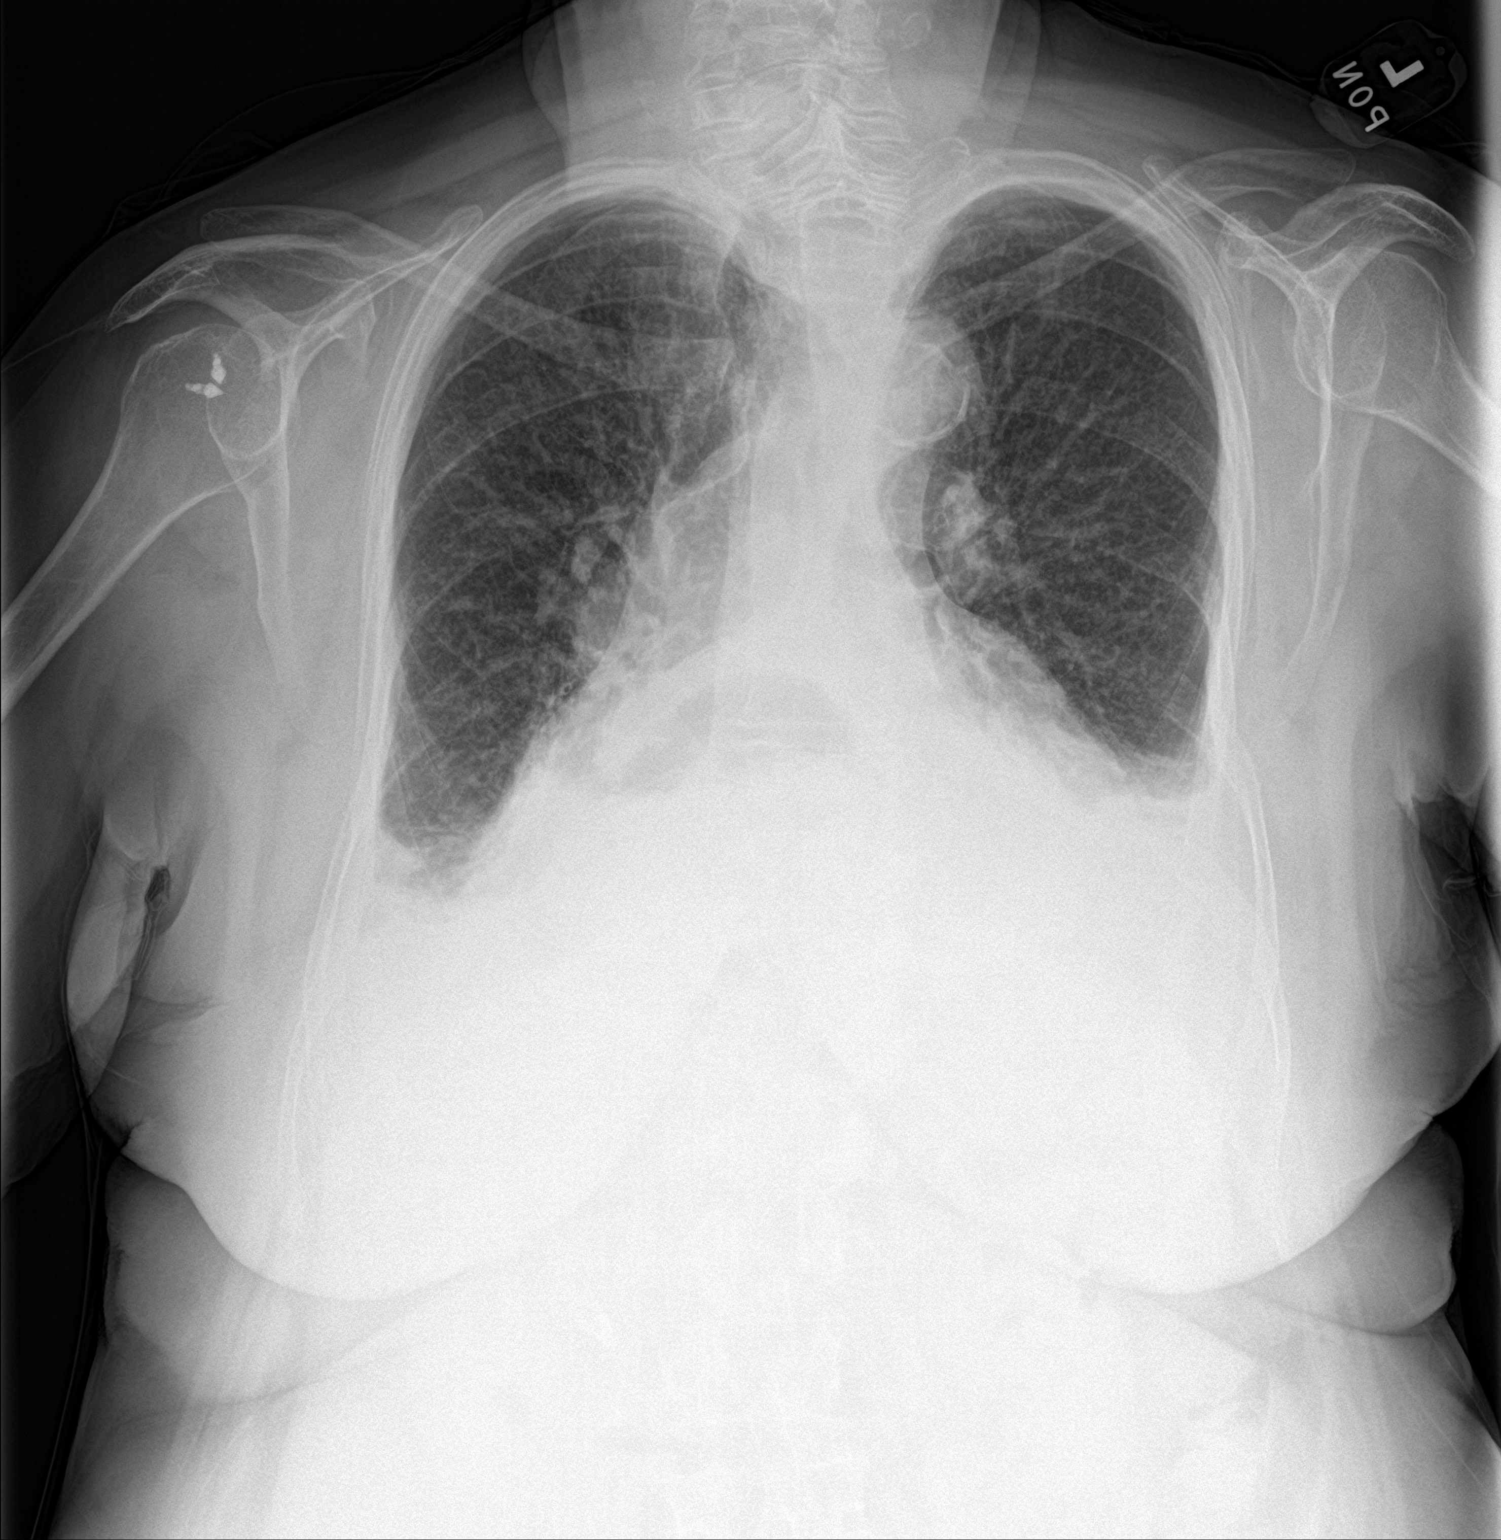

[chest lat]
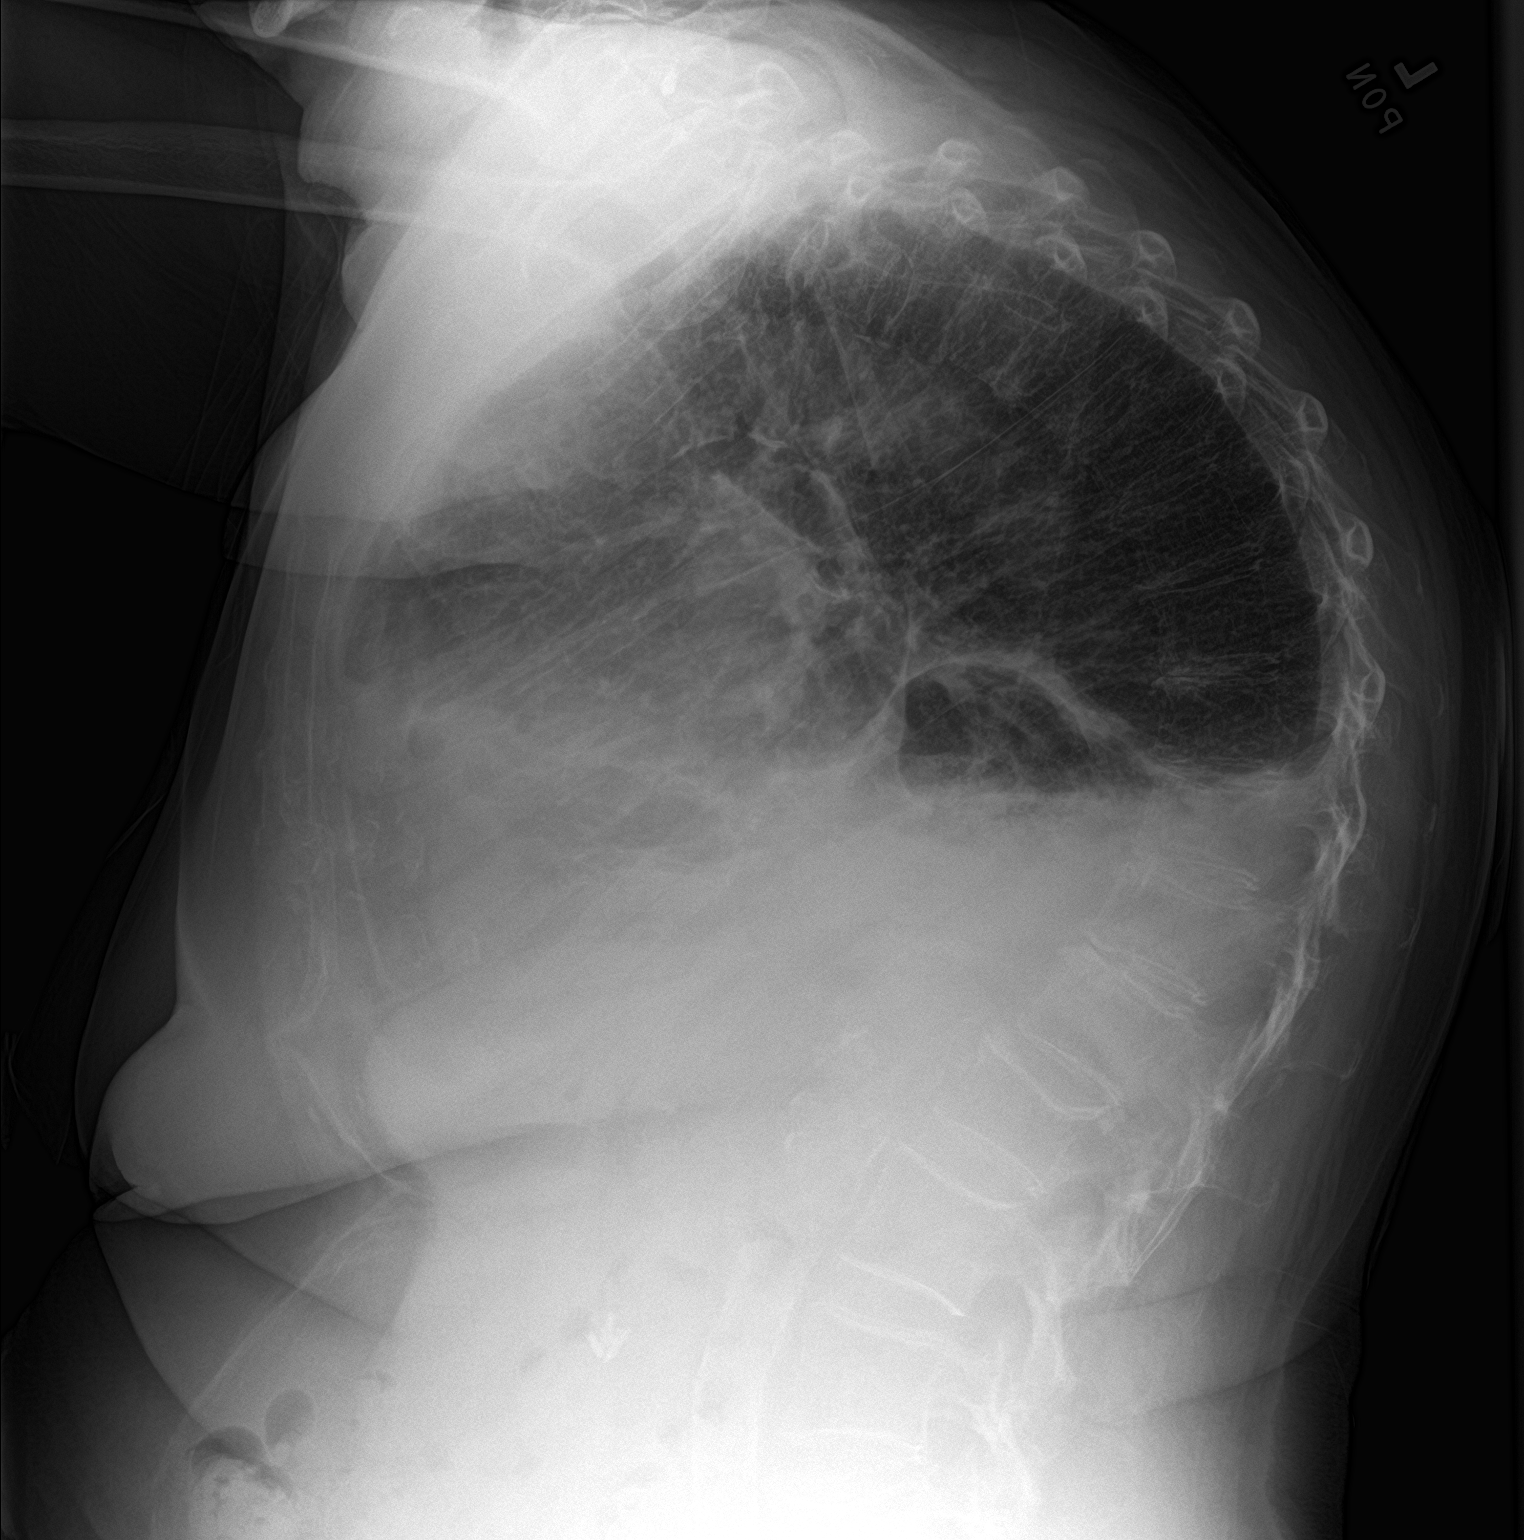

[2 of 2 positions shown; findings below may reference images not displayed]

FINDINGS: Cardiac enlargement, unchanged. Aortic atherosclerotic
calcifications. Large hiatal hernia. New moderate bilateral pleural
effusions identified, left greater than right with overlying areas
of atelectasis. Pulmonary vascular congestion noted. Chronic
thoracic kyphosis.
IMPRESSION: 1. Cardiac enlargement and new bilateral pleural effusions with
pulmonary vascular congestion. Correlate for any clinical signs or
symptoms of CHF.
2. Large hiatal hernia.

## 2023-02-18 NOTE — Progress Notes (Signed)
Cardiology Office Note:    Date:  02/19/2023   ID:  [name], DOB 09/15/1946, MRN [id number]  PCP:  [name], MD  Cardiologist:  Shirlee More, MD    Referring MD: [name], MD    ASSESSMENT:    1. Hypertensive heart disease without heart failure   2. Paroxysmal atrial fibrillation (HCC)   3. Chronic anticoagulation    PLAN:    In order of problems listed above:  Clinically she has more edema suspect diuretic resistance will transition furosemide to torsemide in 2 weeks afterwards recheck labs including renal function potassium and proBNP.  I have asked her to weigh daily and drop off a list of weights at that time.  If unimproved will need to repeat studies like echocardiogram.  She will continue to restrict salt in her diet.  BP at target continue ACE inhibitor No recurrence of atrial fibrillation continue her current anticoagulant along with beta-blocker   Next appointment: 6 months   Medication Adjustments/Labs and Tests Ordered: Current medicines are reviewed at length with the patient today.  Concerns regarding medicines are outlined above.  No orders of the defined types were placed in this encounter.  No orders of the defined types were placed in this encounter.   Chief Complaint  Patient presents with   Follow-up   Atrial Fibrillation   Congestive Heart Failure   Leg Swelling    History of Present Illness:    Briana Barrett is a 77 y.o. female with a hx of paroxysmal atrial fibrillation failing cardioversion and subsequent EP catheter ablation resuming sinus rhythm chronic anticoagulation symptomatic hypotension following COVID-19 infection and hypertensive heart disease with heart failure.  Last seen 08/20/2022.  Echocardiogram performed 08/19/2021 showed ejection fraction 50 to 55% low normal the right ventricle is normal in size function and systolic pressure the right atrium is mildly enlarged and there is no pericardial  effusion.  She had mild mitral regurgitation with annular calcification.    Compliance with diet, lifestyle and medications: Yes  She intermittently checks her heart rhythm about once a month I reviewed 5 strips from the mobile cardia all sinus rhythm Has had no symptomatic episodes to make her think she has atrial fibrillation Despite taking her diuretic she no longer responds her weight is up but does not weigh every day but she notices more edema and discomfort in her legs Restrict sodium in her diet she is not short of breath palpitation syncope or chest pain She has had no bleeding complication from her anticoagulant Past Medical History:  Diagnosis Date   Age-related osteoporosis without current pathological fracture 79/31/2017   Last Assessment & Plan:  Relevant Hx: Course: Daily Update: Today's Plan:she feels this is stable for her   Electronically signed by: [name], NP 05/11/16 1430   Allergic rhinitis 01/21/2016   Last Assessment & Plan:  Relevant Hx: Course: Daily Update: Today's Plan:this is stable for her  Electronically signed by: [name], NP 05/11/16 1427   Anemia, iron deficiency 01/21/2016   Last Assessment & Plan:  Her last iron level was 68 and she is taking the iron daily see her CBC   Anxiety 04/13/2016   Last Assessment & Plan:  She is taking the xanax more daily and not her zoloft and she thinks it helps her more   Atrial fibrillation ([place])    Bilateral primary osteoarthritis of knee 03/25/2021   Bradycardia 07/02/2017   Calcification of lung 01/21/2016  Coronary artery calcification seen on CT scan 01/21/2016   DDD (degenerative disc disease), lumbosacral 01/21/2016   Last Assessment & Plan:  Relevant Hx: Course: Daily Update: Today's Plan:she is working again at third shift at the [employer] and she is on her feet and that is making this worse for her  Electronically signed by: [name], NP 05/11/16 1429   Diabetes  mellitus type 2, controlled ([place]) 01/21/2016   Last Assessment & Plan:  She does not check her sugar as her last average was good for her    Diastolic congestive heart failure, NYHA class 2 ([place]) 11/01/2018   Dyslipidemia 07/23/2016   Encounter for long-term (current) use of high-risk medication 01/21/2016   Episodic lightheadedness 07/02/2017   Gastroesophageal reflux disease without esophagitis 01/21/2016   Hiatal hernia 01/21/2016   Described as large on chest x-ray 2019   History of compression fracture of spine 01/21/2016   History of right breast cancer 01/21/2016   Hypercholesteremia    Hypertension    Hypertension, essential 08/27/2015   Last Assessment & Plan:  Her BP readings that she brings in here are up and down, she has a pending appt with cardiology to FU on this, and she has an extreme amount of stress at home as well that is contributing to this . 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   Impaired functional mobility, balance, gait, and endurance 10/19/2018   Labial cyst 09/09/2021   Localized edema 01/21/2016   Malaise and fatigue 01/21/2016   Last Assessment & Plan:  I really feel her S/S are tied to her BP and the heart rate and it not ideally being controlled for her with her inability to take multiple meds due to her S/e, she is frustrated with this and she stopped her amiodarone last Thursday, and she did not have any episodes since august 4-5, but is taking coreg as directed   Malignant neoplasm of right breast ([place]) 01/21/2016   Mixed hyperlipidemia 01/21/2016   Last Assessment & Plan:  Relevant Hx: Course: Daily Update: Today's Plan:update this for her fasting  Electronically signed by: [name], NP 05/11/16 1432   Moderate recurrent major depression ([place])  01/21/2016   Last Assessment & Plan:  Relevant Hx: Course: Daily Update: Today's Plan:this has been stable for her  Electronically signed by: [name], NP 05/11/16 1432   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[place]) 09/13/2015   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   Last Assessment & Plan:  Her last TSH was normal   Osteoarthritis, generalized 01/21/2016   Last Assessment & Plan:  Relevant Hx: Course: Daily Update: Today's Plan:this is stable for her at this time  Electronically signed by: [name], NP 05/11/16 1430   Ovarian cyst, right 01/21/2016   Paroxysmal atrial flutter ([place]) 07/23/2016   Overview:  Chads score equals 1, prefers aspirin only Overview:  Overview:  Chads score equals 1, prefers aspirin only   Permanent atrial fibrillation ([place]) 10/02/2020   Plantar fasciitis    Prediabetes 01/21/2016   Last Assessment & Plan:  Formatting of this note might be different from the original. She does not check her sugar as her last average was good for her   Primary insomnia 04/13/2016   Last  Assessment & Plan:  Relevant Hx: Course: Daily Update: Today's Plan:this has been more difficult with her working her third shift  Electronically signed by: [name], NP 05/11/16 1433   Renal cyst, right 01/21/2016   Secondary hypercoagulable state ([place]) 08/19/2021   Severe episode of recurrent major depressive disorder, without psychotic features ([place]) 04/12/2020   Swelling 07/02/2017   Thoracic degenerative disc disease 06/16/2018   Tremor 10/21/2018    Past Surgical History:  Procedure Laterality Date   ANKLE SURGERY     ATRIAL FIBRILLATION ABLATION N/A 08/15/2021   Procedure: ATRIAL FIBRILLATION ABLATION;  Surgeon: Constance Haw, MD;  Location: [facility];  Service: Cardiovascular;  Laterality: N/A;   BREAST SURGERY     CARDIOVERSION N/A 03/12/2021   Procedure: CARDIOVERSION;  Surgeon: Acie Fredrickson Wonda Cheng, MD;  Location: [facility];  Service: Cardiovascular;   Laterality: N/A;   CATARACT EXTRACTION     CHOLECYSTECTOMY     FOOT SURGERY     right   SHOULDER SURGERY     SKIN SURGERY  07/2020   nose   TUBAL LIGATION      Current Medications: Current Meds  Medication Sig   acetaminophen (TYLENOL) 650 MG CR tablet Take 650 mg by mouth every 8 (eight) hours as needed for pain.   ALPRAZolam (XANAX) 0.5 MG tablet Take 0.5 mg by mouth at bedtime.   apixaban (ELIQUIS) 5 MG TABS tablet Take 1 tablet (5 mg total) by mouth 2 (two) times daily.   diltiazem (CARDIZEM) 30 MG tablet Take 15 mg by mouth 2 (two) times daily as needed. Take if your heart rate is greater than 130 beats per minute.   furosemide (LASIX) 20 MG tablet TAKE 2 TABLETS BY MOUTH IN THE MORNING AND TAKE 1 TABLET IN THE EVENING   losartan (COZAAR) 100 MG tablet Take 1 tablet (100 mg total) by mouth daily.   metoprolol succinate (TOPROL-XL) 25 MG 24 hr tablet Take 1 tablet (25 mg total) by mouth daily.   omeprazole (PRILOSEC) 40 MG capsule Take 1 capsule (40 mg total) by mouth at bedtime.   potassium chloride SA (KLOR-CON M) 20 MEQ tablet Take 1 tablet (20 mEq total) by mouth 2 (two) times daily.   rosuvastatin (CRESTOR) 10 MG tablet Take 1 tablet (10 mg total) by mouth daily.   trolamine salicylate (ASPERCREME) 10 % cream Apply 1 application topically daily as needed for muscle pain.     Allergies:   Amlodipine besylate, Buprenorphine hcl, and Morphine and related   Social History   Socioeconomic History   Marital status: Widowed    Spouse name: Not on file   Number of children: Not on file   Years of education: Not on file   Highest education level: Not on file  Occupational History   Not on file  Tobacco Use   Smoking status: Never   Smokeless tobacco: Never  Vaping Use   Vaping Use: Never used  Substance and Sexual Activity   Alcohol use: No   Drug use: No   Sexual activity: Not on file  Other Topics Concern   Not on file  Social History Narrative   ** Merged History  Encounter **       Social Determinants of Health   Financial Resource Strain: High Risk (07/28/2021)   Overall Financial Resource Strain (CARDIA)    Difficulty of Paying Living Expenses: Hard  Food Insecurity: Food Insecurity Present (07/28/2021)   Hunger Vital Sign  Worried About Charity fundraiser in the Last Year: Sometimes true    [employer] of Food in the Last Year: Sometimes true  Transportation Needs: No Transportation Needs (07/28/2021)   PRAPARE - Hydrologist (Medical): No    Lack of Transportation (Non-Medical): No  Physical Activity: Not on file  Stress: Stress Concern Present (07/28/2021)   [place]    Feeling of Stress : Rather much  Social Connections: Not on file     Family History: The patient's family history includes COPD in her father; Cancer in her mother; Heart failure in her sister; Hypertension in her father and mother. ROS:   Please see the history of present illness.    All other systems reviewed and are negative.  EKGs/Labs/Other Studies Reviewed:    The following studies were reviewed today:  Cardiac Studies & Procedures     STRESS TESTS  MYOCARDIAL PERFUSION IMAGING 12/22/2022  Narrative   The study is normal. The study is low risk.   Left ventricular function is normal. Nuclear stress EF: 64 %. The left ventricular ejection fraction is normal (55-65%). End diastolic cavity size is normal.   ECHOCARDIOGRAM  ECHOCARDIOGRAM COMPLETE 08/19/2021  Narrative ECHOCARDIOGRAM REPORT    Patient Name:   CARINNA ROHAL Date of Exam: 08/19/2021 Medical Rec #:  [id number]             Height:       66.0 in Accession #:    [id number]            Weight:       181.0 lb Date of Birth:  [birth year]             BSA:          1.917 m Patient Age:    30 years              BP:           146/90 mmHg Patient Gender: F                     HR:           65 bpm. Exam  Location:  Outpatient  Procedure: 2D Echo, Cardiac Doppler and Color Doppler  Indications:    Atrial fibrillation  History:        Patient has prior history of Echocardiogram examinations, most recent 08/19/2020. CHF, Arrythmias:Atrial Fibrillation, Signs/Symptoms:Shortness of Breath and s/p ablation 08/15/21; Risk Factors:Hypertension, Dyslipidemia and Diabetes.  Sonographer:    Dustin Flock RDCS Referring Phys: [id number] [place]   1. Hypokinesis of the distal lateral and apical walls. . Left ventricular ejection fraction, by estimation, is 50 to 55%. The left ventricle has low normal function. Left ventricular diastolic parameters are indeterminate. 2. Right ventricular systolic function is normal. The right ventricular size is normal. 3. Right atrial size was mildly dilated. 4. Mild mitral valve regurgitation. 5. AV is thickened, calcified with mildly restricted motion. Peak and mean gradients through the valve are 10 and 5 mm Hg respectively consistent with mild AS. Marland Kitchen Aortic valve regurgitation is not visualized. 6. The inferior vena cava is normal in size with <50% respiratory variability, suggesting right atrial pressure of 8 mmHg.  FINDINGS Left Ventricle: Hypokinesis of the distal lateral and apical walls. Left ventricular ejection fraction, by estimation, is 50 to 55%. The left ventricle has low normal function. The left  ventricular internal cavity size was normal in size. There is no left ventricular hypertrophy. Left ventricular diastolic parameters are indeterminate.  Right Ventricle: The right ventricular size is normal. Right vetricular wall thickness was not assessed. Right ventricular systolic function is normal.  Left Atrium: Left atrial size was normal in size.  Right Atrium: Right atrial size was mildly dilated.  Pericardium: There is no evidence of pericardial effusion.  Mitral Valve: There is mild thickening of the mitral valve leaflet(s).  Mild mitral annular calcification. Mild mitral valve regurgitation.  Tricuspid Valve: The tricuspid valve is normal in structure. Tricuspid valve regurgitation is mild.  Aortic Valve: AV is thickened, calcified with mildly restricted motion. Peak and mean gradients through the valve are 10 and 5 mm Hg respectively consistent with mild AS. Aortic valve regurgitation is not visualized. Aortic valve mean gradient measures 5.0 mmHg. Aortic valve peak gradient measures 10.0 mmHg. Aortic valve area, by VTI measures 1.51 cm.  Pulmonic Valve: The pulmonic valve was grossly normal. Pulmonic valve regurgitation is mild.  Aorta: The aortic root and ascending aorta are structurally normal, with no evidence of dilitation.  Venous: The inferior vena cava is normal in size with less than 50% respiratory variability, suggesting right atrial pressure of 8 mmHg.  IAS/Shunts: No atrial level shunt detected by color flow Doppler.   LEFT VENTRICLE PLAX 2D LVIDd:         4.70 cm  Diastology LVIDs:         3.20 cm  LV e' medial:    4.46 cm/s LV PW:         1.10 cm  LV E/e' medial:  22.9 LV IVS:        1.20 cm  LV e' lateral:   4.79 cm/s LVOT diam:     2.00 cm  LV E/e' lateral: 21.3 LV SV:         47 LV SV Index:   25 LVOT Area:     3.14 cm   RIGHT VENTRICLE RV Basal diam:  3.10 cm RV S prime:     13.10 cm/s TAPSE (M-mode): 1.8 cm  LEFT ATRIUM             Index       RIGHT ATRIUM           Index LA diam:        4.00 cm 2.09 cm/m  RA Area:     24.20 cm LA Vol (A2C):   49.4 ml 25.77 ml/m RA Volume:   81.50 ml  42.52 ml/m LA Vol (A4C):   60.0 ml 31.30 ml/m LA Biplane Vol: 57.6 ml 30.05 ml/m AORTIC VALVE AV Area (Vmax):    1.41 cm AV Area (Vmean):   1.44 cm AV Area (VTI):     1.51 cm AV Vmax:           158.00 cm/s AV Vmean:          106.500 cm/s AV VTI:            0.312 m AV Peak Grad:      10.0 mmHg AV Mean Grad:      5.0 mmHg LVOT Vmax:         70.80 cm/s LVOT Vmean:        48.700  cm/s LVOT VTI:          0.150 m LVOT/AV VTI ratio: 0.48  AORTA Ao Root diam: 2.90 cm  MITRAL VALVE  TRICUSPID VALVE MV Area (PHT): 2.69 cm     TR Peak grad:   35.3 mmHg MV Decel Time: 282 msec     TR Vmax:        297.00 cm/s MV E velocity: 102.00 cm/s MV A velocity: 29.00 cm/s   SHUNTS MV E/A ratio:  3.52         Systemic VTI:  0.15 m Systemic Diam: 2.00 cm  Dorris Carnes MD Electronically signed by Dorris Carnes MD Signature Date/Time: 08/19/2021/5:31:18 PM    Final    MONITORS  LONG TERM MONITOR (3-14 DAYS) 10/09/2020  Narrative A ZIO monitor was performed for 3 days beginning 09/18/2020 to assess atrial fibrillation.  The cardiac rhythm throughout was atrial fibrillation with average, minimum maximum heart rates of 77, 45 and 169 bpm. Heart rate control was good, daytime 97% between 50 and 110 bpm 1% between 110 150 bpm and less than 1% less than 50 or greater than 150 bpm.  Nighttime 100% heart rate control 50 to 110 bpm.  There were no pauses of 3 seconds or greater.  Ventricular ectopy was rare with isolated PVCs and couplets.  There were 4 triggered and 3 diary events associated with isolated PVCs   Conclusion, persistent atrial fibrillation with good heart rate control.  Although ventricular ectopy was rare the triggered and diary events are associated with isolated PVCs.          12/10/2022 BMP with a sodium 138 potassium 3.9 creatinine 1.0 hemoglobin 13.5  Recent Labs: No results found for requested labs within last 365 days.  Recent Lipid Panel    Component Value Date/Time   CHOL 162 02/12/2022 1052   TRIG 103 02/12/2022 1052   HDL 49 02/12/2022 1052   CHOLHDL 3.3 02/12/2022 1052   LDLCALC 94 02/12/2022 1052    Physical Exam:    VS:  BP 138/84 (BP Location: Left Arm, Patient Position: Sitting, Cuff Size: Normal)   Pulse 60   Ht '5\' 5"'$  (1.651 m)   Wt 182 lb (82.6 kg)   SpO2 95%   BMI 30.29 kg/m     Wt Readings from Last 3 Encounters:   02/19/23 182 lb (82.6 kg)  12/07/22 181 lb 9.6 oz (82.4 kg)  08/20/22 186 lb 12.8 oz (84.7 kg)     GEN:  Well nourished, well developed in no acute distress HEENT: Normal NECK: No JVD; No carotid bruits LYMPHATICS: No lymphadenopathy CARDIAC: RRR, no murmurs, rubs, gallops RESPIRATORY:  Clear to auscultation without rales, wheezing or rhonchi  ABDOMEN: Soft, non-tender, non-distended MUSCULOSKELETAL: Marked bilateral lower extremity edema greater than 4 close edema; No deformity  SKIN: Warm and dry NEUROLOGIC:  Alert and oriented x 3 PSYCHIATRIC:  Normal affect    Signed, Shirlee More, MD  02/19/2023 10:39 AM    [place]

## 2023-02-19 ENCOUNTER — Ambulatory Visit: Payer: Medicare Other | Attending: Cardiology | Admitting: Cardiology

## 2023-02-19 ENCOUNTER — Encounter: Payer: Self-pay | Admitting: Cardiology

## 2023-02-19 VITALS — BP 138/84 | HR 60 | Ht 65.0 in | Wt 182.0 lb

## 2023-02-19 DIAGNOSIS — I119 Hypertensive heart disease without heart failure: Secondary | ICD-10-CM | POA: Diagnosis not present

## 2023-02-19 DIAGNOSIS — Z7901 Long term (current) use of anticoagulants: Secondary | ICD-10-CM

## 2023-02-19 DIAGNOSIS — I480 Paroxysmal atrial fibrillation: Secondary | ICD-10-CM

## 2023-02-19 MED ORDER — TORSEMIDE 20 MG PO TABS: 20.0000 mg | ORAL_TABLET | Freq: Every day | ORAL | 0 refills | Status: DC

## 2023-02-19 MED ORDER — TORSEMIDE 20 MG PO TABS: 20.0000 mg | ORAL_TABLET | Freq: Every day | ORAL | 3 refills | Status: DC

## 2023-02-19 NOTE — Patient Instructions (Signed)
Medication Instructions:  Your physician has recommended you make the following change in your medication:   STOP: Furosemide START: Torsemide  20 mg daily (On M-W-F take a second Torsemide in the afternoon)  *If you need a refill on your cardiac medications before your next appointment, please call your pharmacy*   Lab Work: Your physician recommends that you return for lab work in:   Labs in 2 weeks: BMP, Pro BNP  If you have labs (blood work) drawn today and your tests are completely normal, you will receive your results only by: [place] (if you have MyChart) OR A paper copy in the mail If you have any lab test that is abnormal or we need to change your treatment, we will call you to review the results.   Testing/Procedures: None   Follow-Up: At [facility], you and your health needs are our priority.  As part of our continuing mission to provide you with exceptional heart care, we have created designated Provider Care Teams.  These Care Teams include your primary Cardiologist (physician) and Advanced Practice Providers (APPs -  Physician Assistants and Nurse Practitioners) who all work together to provide you with the care you need, when you need it.  We recommend signing up for the patient portal called "MyChart".  Sign up information is provided on this After Visit Summary.  MyChart is used to connect with patients for Virtual Visits (Telemedicine).  Patients are able to view lab/test results, encounter notes, upcoming appointments, etc.  Non-urgent messages can be sent to your provider as well.   To learn more about what you can do with MyChart, go to [email or web address].    Your next appointment:   6 month(s)  Provider:   Shirlee More, MD    Other Instructions Check heart rhythm on mobile kardia every week.  In two weeks bring a list of weights to the office.

## 2023-02-20 ENCOUNTER — Other Ambulatory Visit: Payer: Self-pay | Admitting: Cardiology

## 2023-03-08 ENCOUNTER — Other Ambulatory Visit: Payer: Self-pay

## 2023-03-08 DIAGNOSIS — R0600 Dyspnea, unspecified: Secondary | ICD-10-CM

## 2023-03-09 LAB — BASIC METABOLIC PANEL
BUN/Creatinine Ratio: 12 (ref 12–28)
BUN: 11 mg/dL (ref 8–27)
CO2: 26 mmol/L (ref 20–29)
Calcium: 9 mg/dL (ref 8.7–10.3)
Chloride: 104 mmol/L (ref 96–106)
Creatinine, Ser: 0.93 mg/dL (ref 0.57–1.00)
Glucose: 142 mg/dL — ABNORMAL HIGH (ref 70–99)
Potassium: 3.8 mmol/L (ref 3.5–5.2)
Sodium: 144 mmol/L (ref 134–144)
eGFR: 63 mL/min/{1.73_m2} (ref >59)

## 2023-03-09 LAB — PRO B NATRIURETIC PEPTIDE: NT-Pro BNP: 654 pg/mL (ref 0–738)

## 2023-03-10 ENCOUNTER — Other Ambulatory Visit: Payer: Self-pay

## 2023-03-10 ENCOUNTER — Telehealth: Payer: Self-pay | Admitting: Cardiology

## 2023-03-10 NOTE — Telephone Encounter (Signed)
Follow Up:       Patient is returning Briana Barrett's call from today, concerning her lab results.

## 2023-03-10 NOTE — Telephone Encounter (Signed)
Patient informed of results.  

## 2023-03-11 ENCOUNTER — Other Ambulatory Visit: Payer: Self-pay | Admitting: Oncology

## 2023-03-11 DIAGNOSIS — D509 Iron deficiency anemia, unspecified: Secondary | ICD-10-CM

## 2023-03-11 NOTE — Progress Notes (Signed)
[facility]  [address] [place],  [place]  [address] [phone]  Clinic Day:  03/12/2023  Referring physician: [name], MD  HISTORY OF PRESENT ILLNESS:  The patient is a 77 y.o. female who I was asked to re-evaluate for iron deficiency anemia.  Recent labs showed a normal hemoglobin of 12.4, but with collectively low iron levels.  She denies having any overt forms of blood loss to explain her iron deficiency anemia.  She also denies having increased fatigue.  She denies having any particular changes in her health over these past months.    She also carries a diagnosis of stage IA (T1a N0 M0) hormone positive breast cancer, status post a lumpectomy in November 2013.   PHYSICAL EXAM:  Blood pressure (!) 150/83, pulse 75, temperature 98.7 F (37.1 C), resp. rate 16, height 5\' 5"  (1.651 m), weight 182 lb 8 oz (82.8 kg), SpO2 91 %. Wt Readings from Last 3 Encounters:  03/16/23 183 lb 1.9 oz (83.1 kg)  03/12/23 182 lb 8 oz (82.8 kg)  02/19/23 182 lb (82.6 kg)   Body mass index is 30.37 kg/m. Performance status (ECOG): 0 - Asymptomatic Physical Exam Constitutional:      Appearance: Normal appearance. She is not ill-appearing.  HENT:     Mouth/Throat:     Mouth: Mucous membranes are moist.     Pharynx: Oropharynx is clear. No oropharyngeal exudate or posterior oropharyngeal erythema.  Cardiovascular:     Rate and Rhythm: Normal rate and regular rhythm.     Heart sounds: No murmur heard.    No friction rub. No gallop.  Pulmonary:     Effort: Pulmonary effort is normal. No respiratory distress.     Breath sounds: Normal breath sounds. No wheezing, rhonchi or rales.  Abdominal:     General: Bowel sounds are normal. There is no distension.     Palpations: Abdomen is soft. There is no mass.     Tenderness: There is no abdominal tenderness.  Musculoskeletal:        General: No swelling.     Right lower leg: No edema.     Left lower leg:  No edema.  Lymphadenopathy:     Cervical: No cervical adenopathy.     Upper Body:     Right upper body: No supraclavicular or axillary adenopathy.     Left upper body: No supraclavicular or axillary adenopathy.     Lower Body: No right inguinal adenopathy. No left inguinal adenopathy.  Skin:    General: Skin is warm.     Coloration: Skin is not jaundiced.     Findings: No lesion or rash.  Neurological:     General: No focal deficit present.     Mental Status: She is alert and oriented to person, place, and time. Mental status is at baseline.  Psychiatric:        Mood and Affect: Mood normal.        Behavior: Behavior normal.        Thought Content: Thought content normal.    LABS:      Latest Ref Rng & Units 03/12/2023   12:00 AM 07/29/2021    1:41 PM 03/07/2021   10:49 AM  CBC  WBC  5.6     5.5  6.4   Hemoglobin 12.0 - 16.0 12.2     14.1  14.7   Hematocrit 36 - 46 37     44.9  45.9  Platelets 150 - 400 K/uL 253     262  268      This result is from an external source.      Latest Ref Rng & Units 03/08/2023    4:00 PM 01/30/2022   10:59 AM 08/29/2021    1:17 PM  CMP  Glucose 70 - 99 mg/dL 142  98  77   BUN 8 - 27 mg/dL 11  13  16    Creatinine 0.57 - 1.00 mg/dL 0.93  0.94  0.86   Sodium 134 - 144 mmol/L 144  135  142   Potassium 3.5 - 5.2 mmol/L 3.8  4.0  4.3   Chloride 96 - 106 mmol/L 104  97  101   CO2 20 - 29 mmol/L 26  27  28    Calcium 8.7 - 10.3 mg/dL 9.0  9.1  9.5   Total Protein 6.0 - 8.5 g/dL  6.2    Total Bilirubin 0.0 - 1.2 mg/dL  0.6    Alkaline Phos 44 - 121 IU/L  87    AST 0 - 40 IU/L  16    ALT 0 - 32 IU/L  11     Component Ref Range & Units 10 d ago  Iron 50 - 212 ug/dL 45 Low   Transferrin 203 - 362 mg/dL 325  Ferritin 11 - 307 ng/mL 14  Total Iron Binding Capacity (TIBC) 290 - 518 ug/dL 465  Transferrin Saturation 15 - 45 % 10 Low   Resulting Agency [facility](CLIA# [id number])    Specimen Collected: 03/01/23  16:24    ASSESSMENT & PLAN:  Assessment/Plan:  A 77 y.o. female with recurrent iron deficiency anemia.  I will arrange for her to receive IV iron over these next few weeks to rapidly replenish her iron stores and improve her hemoglobin.  Due to having recurrent iron deficiency, I will refer her to GI for a formal workup.  Otherwise, I will see her back in 3 months to see how well she responded to her upcoming IV iron.  The patient understands all the plans discussed today and is in agreement with them.    Briana Holik Macarthur Critchley, MD

## 2023-03-12 ENCOUNTER — Inpatient Hospital Stay: Payer: Medicare Other | Attending: Oncology | Admitting: Oncology

## 2023-03-12 ENCOUNTER — Other Ambulatory Visit: Payer: Self-pay | Admitting: Oncology

## 2023-03-12 ENCOUNTER — Inpatient Hospital Stay: Payer: Medicare Other

## 2023-03-12 VITALS — BP 150/83 | HR 75 | Temp 98.7°F | Resp 16 | Ht 65.0 in | Wt 182.5 lb

## 2023-03-12 DIAGNOSIS — D508 Other iron deficiency anemias: Secondary | ICD-10-CM

## 2023-03-12 DIAGNOSIS — D509 Iron deficiency anemia, unspecified: Secondary | ICD-10-CM

## 2023-03-12 DIAGNOSIS — Z853 Personal history of malignant neoplasm of breast: Secondary | ICD-10-CM | POA: Insufficient documentation

## 2023-03-12 LAB — CBC AND DIFFERENTIAL
HCT: 37 (ref 36–46)
Hemoglobin: 12.2 (ref 12.0–16.0)
Neutrophils Absolute: 3.19
Platelets: 253 10*3/uL (ref 150–400)
WBC: 5.6

## 2023-03-12 LAB — CBC: RBC: 4.67 (ref 3.87–5.11)

## 2023-03-15 MED FILL — Ferumoxytol Inj 510 MG/17ML (30 MG/ML) (Elemental Fe): INTRAVENOUS | Qty: 17 | Status: AC

## 2023-03-16 ENCOUNTER — Telehealth: Payer: Self-pay | Admitting: Cardiology

## 2023-03-16 ENCOUNTER — Inpatient Hospital Stay: Payer: Medicare Other

## 2023-03-16 VITALS — BP 150/77 | HR 70 | Temp 98.4°F | Resp 18 | Ht 65.0 in | Wt 183.1 lb

## 2023-03-16 DIAGNOSIS — D509 Iron deficiency anemia, unspecified: Secondary | ICD-10-CM | POA: Diagnosis not present

## 2023-03-16 DIAGNOSIS — Z853 Personal history of malignant neoplasm of breast: Secondary | ICD-10-CM | POA: Diagnosis not present

## 2023-03-16 DIAGNOSIS — D508 Other iron deficiency anemias: Secondary | ICD-10-CM

## 2023-03-16 MED ORDER — SODIUM CHLORIDE 0.9 % IV SOLN
510.0000 mg | Freq: Once | INTRAVENOUS | Status: AC
  Administered 2023-03-16: 510 mg via INTRAVENOUS
  Filled 2023-03-16: qty 510

## 2023-03-16 MED ORDER — TORSEMIDE 20 MG PO TABS: 20.0000 mg | ORAL_TABLET | Freq: Two times a day (BID) | ORAL | 3 refills | Status: DC

## 2023-03-16 MED ORDER — SODIUM CHLORIDE 0.9 % IV SOLN: Freq: Once | INTRAVENOUS | Status: AC

## 2023-03-16 NOTE — Addendum Note (Signed)
Addended by: Truddie Hidden on: 03/16/2023 04:27 PM   Modules accepted: Orders

## 2023-03-16 NOTE — Patient Instructions (Signed)

## 2023-03-16 NOTE — Telephone Encounter (Signed)
Full vm 

## 2023-03-16 NOTE — Telephone Encounter (Signed)
Pt c/o swelling: STAT is pt has developed SOB within 24 hours  How much weight have you gained and in what time span? 3 lbs in a day   If swelling, where is the swelling located? Both legs   Are you currently taking a fluid pill? Yes   Are you currently SOB? When she is up and doing chores around the house.   Do you have a log of your daily weights (if so, list)? No   Have you gained 3 pounds in a day or 5 pounds in a week? 3 pounds in a day   Have you traveled recently? No

## 2023-03-16 NOTE — Telephone Encounter (Signed)
Spoke with pt who states that her legs are swollen and painful. Pt reports that her weight has been  3/23 179.9 3/24 179.5 3/25 175 3/26 174.2  BP 143/80 this am when she went for her Fe infusion. Pt states that her legs itch. Pt denies salt in her diet. Pt states that she is unable to wear compression hose as they caused her legs to itch very bad. Advised to keep her legs elevated as much as possible.

## 2023-03-16 NOTE — Telephone Encounter (Signed)
Recommendations reviewed with pt per Dr. Joya Gaskins note. Pt verbalized understanding and had no additional questions.

## 2023-03-18 ENCOUNTER — Telehealth: Payer: Self-pay | Admitting: Cardiology

## 2023-03-18 MED ORDER — TORSEMIDE 20 MG PO TABS: 20.0000 mg | ORAL_TABLET | Freq: Two times a day (BID) | ORAL | 3 refills | Status: DC

## 2023-03-18 NOTE — Telephone Encounter (Signed)
Pt c/o medication issue:  1. Name of Medication: torsemide (DEMADEX) 20 MG tablet   2. How are you currently taking this medication (dosage and times per day)?   Take 1 tablet (20 mg total) by mouth 2 (two) times daily.    3. Are you having a reaction (difficulty breathing--STAT)? no  4. What is your medication issue? Patient needs this script sent to her mail order pharmacy, so she won't have to pay for it.  She states it was recently changed to taking it twice a day. Please send script to [facility], [place]

## 2023-03-18 NOTE — Telephone Encounter (Signed)
Refill has been sent to [facility] as requested.

## 2023-03-22 ENCOUNTER — Other Ambulatory Visit: Payer: Self-pay | Admitting: Cardiology

## 2023-03-22 ENCOUNTER — Telehealth: Payer: Self-pay

## 2023-03-22 NOTE — Telephone Encounter (Addendum)
Manuela Schwartz Phy,RPH: This isn't a normal reaction from feraheme. I wonder if something else if going on. She could have covid and be asymptomatic. I would rec that she take a home test if she has one. Does she have any SOB or swelling? She has some cardiovascular history and on demadex so maybe the extra fluid caused some issues? I would rec a follow-up with PCP prior to her next dose. We can postpone the 2nd dose if needed. Another thought is that they recently increased her demadex so is she having low potassium from that?   Sahory Nordling,RN: Pt has "felt weak since I took the iron treatment last week. I can't hardly walk, because I'm weak. I've not had any fever, nausea, or diarrhea. I'm supposed to get another treatment tomorrow and I don't know if I should come".  She doesn't feel like she has a virus. She hasn't had any exposure to anyone who has been sick. She just feels weak. I sent above message to Dr Bobby Rumpf and Manuela Schwartz [facility].

## 2023-03-23 ENCOUNTER — Other Ambulatory Visit: Payer: Self-pay | Admitting: Cardiology

## 2023-03-23 ENCOUNTER — Encounter: Payer: Self-pay | Admitting: Oncology

## 2023-03-23 ENCOUNTER — Ambulatory Visit: Payer: Medicare Other

## 2023-03-25 ENCOUNTER — Encounter: Payer: Self-pay | Admitting: Oncology

## 2023-03-25 MED FILL — Ferumoxytol Inj 510 MG/17ML (30 MG/ML) (Elemental Fe): INTRAVENOUS | Qty: 17 | Status: AC

## 2023-03-26 ENCOUNTER — Inpatient Hospital Stay: Payer: Medicare Other | Attending: Oncology

## 2023-03-26 VITALS — BP 153/84 | HR 66 | Temp 98.7°F | Resp 18 | Ht 65.0 in | Wt 180.0 lb

## 2023-03-26 DIAGNOSIS — D509 Iron deficiency anemia, unspecified: Secondary | ICD-10-CM | POA: Insufficient documentation

## 2023-03-26 DIAGNOSIS — Z79899 Other long term (current) drug therapy: Secondary | ICD-10-CM | POA: Insufficient documentation

## 2023-03-26 DIAGNOSIS — D508 Other iron deficiency anemias: Secondary | ICD-10-CM

## 2023-03-26 MED ORDER — SODIUM CHLORIDE 0.9 % IV SOLN: Freq: Once | INTRAVENOUS | Status: AC

## 2023-03-26 MED ORDER — SODIUM CHLORIDE 0.9 % IV SOLN
510.0000 mg | Freq: Once | INTRAVENOUS | Status: AC
  Administered 2023-03-26: 510 mg via INTRAVENOUS
  Filled 2023-03-26: qty 510

## 2023-03-26 NOTE — Patient Instructions (Signed)

## 2023-04-03 ENCOUNTER — Other Ambulatory Visit: Payer: Self-pay | Admitting: Cardiology

## 2023-05-15 ENCOUNTER — Other Ambulatory Visit: Payer: Self-pay | Admitting: Cardiology

## 2023-05-20 ENCOUNTER — Other Ambulatory Visit: Payer: Self-pay | Admitting: Cardiology

## 2023-05-25 DIAGNOSIS — N390 Urinary tract infection, site not specified: Secondary | ICD-10-CM

## 2023-05-25 DIAGNOSIS — K429 Umbilical hernia without obstruction or gangrene: Secondary | ICD-10-CM | POA: Insufficient documentation

## 2023-05-25 HISTORY — DX: Urinary tract infection, site not specified: N39.0

## 2023-05-25 HISTORY — DX: Umbilical hernia without obstruction or gangrene: K42.9

## 2023-05-31 ENCOUNTER — Other Ambulatory Visit: Payer: Self-pay | Admitting: Cardiology

## 2023-05-31 ENCOUNTER — Telehealth: Payer: Self-pay

## 2023-05-31 ENCOUNTER — Telehealth: Payer: Self-pay | Admitting: Cardiology

## 2023-05-31 ENCOUNTER — Ambulatory Visit: Payer: Medicare Other | Attending: Cardiology | Admitting: Cardiology

## 2023-05-31 VITALS — BP 130/82 | HR 64 | Wt 184.2 lb

## 2023-05-31 DIAGNOSIS — I10 Essential (primary) hypertension: Secondary | ICD-10-CM

## 2023-05-31 DIAGNOSIS — I480 Paroxysmal atrial fibrillation: Secondary | ICD-10-CM

## 2023-05-31 DIAGNOSIS — R0600 Dyspnea, unspecified: Secondary | ICD-10-CM

## 2023-05-31 DIAGNOSIS — I2510 Atherosclerotic heart disease of native coronary artery without angina pectoris: Secondary | ICD-10-CM | POA: Diagnosis not present

## 2023-05-31 DIAGNOSIS — I5032 Chronic diastolic (congestive) heart failure: Secondary | ICD-10-CM | POA: Diagnosis not present

## 2023-05-31 NOTE — Telephone Encounter (Signed)
The pre-certification came back and no pre-cert was required. I informed the front office staff and [name] stated that she would call and schedule the test with the patient

## 2023-05-31 NOTE — Telephone Encounter (Signed)
Pt c/o Shortness Of Breath: STAT if SOB developed within the last 24 hours or pt is noticeably SOB on the phone  1. Are you currently SOB (can you hear that pt is SOB on the phone)? Yes  2. How long have you been experiencing SOB? Few months  3. Are you SOB when sitting or when up moving around? Moving around  4. Are you currently experiencing any other symptoms? HR elevates   Call transferred

## 2023-05-31 NOTE — Telephone Encounter (Signed)
Patient arrived for  for her nurse visit and an EKG was performed  with vitals and Dr. Bing Matter saw the patient and ordered an echocardiogram and a CMP and Pro BNP. Patient had lab work completed at the office and her echo was sent to pre-cert because it was to be done at [facility] due to scheduling.

## 2023-05-31 NOTE — Telephone Encounter (Signed)
Called patient and she reported that she had been having SOB for several months and it has been getting worse over the past month. Also when she exerts herself she gets tachycardic with a heart rate of 154. She also reports that she has edema in both of her lower extremities. Patient has a history of A-fib and she checked her [facility] and it states that she is not in A-fib. Spoke with Jennifer Woody, NP and she recommended having the patient come in for a nurse visit to have an EKG performed. I relayed this information to the patient and she verbalized understanding and a nurse visit was scheduled for 6/10 at 11:30 am. Patient had no further questions at this time. 

## 2023-05-31 NOTE — Telephone Encounter (Signed)
Called patient and she reported that she had been having SOB for several months and it has been getting worse over the past month. Also when she exerts herself she gets tachycardic with a heart rate of 154. She also reports that she has edema in both of her lower extremities. Patient has a history of A-fib and she checked her [facility] and it states that she is not in A-fib. Spoke with Wallis Bamberg, NP and she recommended having the patient come in for a nurse visit to have an EKG performed. I relayed this information to the patient and she verbalized understanding and a nurse visit was scheduled for 6/10 at 11:30 am. Patient had no further questions at this time.

## 2023-06-01 ENCOUNTER — Encounter: Payer: Self-pay | Admitting: Cardiology

## 2023-06-01 ENCOUNTER — Other Ambulatory Visit: Payer: Self-pay

## 2023-06-01 DIAGNOSIS — I480 Paroxysmal atrial fibrillation: Secondary | ICD-10-CM

## 2023-06-01 LAB — COMPREHENSIVE METABOLIC PANEL
ALT: 10 IU/L (ref 0–32)
AST: 16 IU/L (ref 0–40)
Albumin/Globulin Ratio: 1.6
Albumin: 3.9 g/dL (ref 3.8–4.8)
Alkaline Phosphatase: 83 IU/L (ref 44–121)
BUN/Creatinine Ratio: 13 (ref 12–28)
BUN: 13 mg/dL (ref 8–27)
Bilirubin Total: 0.6 mg/dL (ref 0.0–1.2)
CO2: 23 mmol/L (ref 20–29)
Calcium: 9.1 mg/dL (ref 8.7–10.3)
Chloride: 102 mmol/L (ref 96–106)
Creatinine, Ser: 0.98 mg/dL (ref 0.57–1.00)
Globulin, Total: 2.4 g/dL (ref 1.5–4.5)
Glucose: 157 mg/dL — ABNORMAL HIGH (ref 70–99)
Potassium: 4.2 mmol/L (ref 3.5–5.2)
Sodium: 141 mmol/L (ref 134–144)
Total Protein: 6.3 g/dL (ref 6.0–8.5)
eGFR: 59 mL/min/{1.73_m2} — ABNORMAL LOW (ref >59)

## 2023-06-01 LAB — PRO B NATRIURETIC PEPTIDE: NT-Pro BNP: 885 pg/mL — ABNORMAL HIGH (ref 0–738)

## 2023-06-01 NOTE — Patient Instructions (Signed)
Medication Instructions:  Your physician recommends that you continue on your current medications as directed. Please refer to the Current Medication list given to you today.  *If you need a refill on your cardiac medications before your next appointment, please call your pharmacy*   Lab Work: Your physician recommends that you return for lab work in:   Labs today: Pro BNP, CMP  If you have labs (blood work) drawn today and your tests are completely normal, you will receive your results only by: MyChart Message (if you have MyChart) OR A paper copy in the mail If you have any lab test that is abnormal or we need to change your treatment, we will call you to review the results.   Testing/Procedures: Your physician has requested that you have an echocardiogram. Echocardiography is a painless test that uses sound waves to create images of your heart. It provides your doctor with information about the size and shape of your heart and how well your heart's chambers and valves are working. This procedure takes approximately one hour. There are no restrictions for this procedure. Please do NOT wear cologne, perfume, aftershave, or lotions (deodorant is allowed). Please arrive 15 minutes prior to your appointment time.    Follow-Up: At [facility], you and your health needs are our priority.  As part of our continuing mission to provide you with exceptional heart care, we have created designated Provider Care Teams.  These Care Teams include your primary Cardiologist (physician) and Advanced Practice Providers (APPs -  Physician Assistants and Nurse Practitioners) who all work together to provide you with the care you need, when you need it.  We recommend signing up for the patient portal called "MyChart".  Sign up information is provided on this After Visit Summary.  MyChart is used to connect with patients for Virtual Visits (Telemedicine).  Patients are able to view lab/test results,  encounter notes, upcoming appointments, etc.  Non-urgent messages can be sent to your provider as well.   To learn more about what you can do with MyChart, go to [email or web address].    Your next appointment:   3 month(s)  Provider:   Gypsy Balsam, MD    Other Instructions None

## 2023-06-03 DIAGNOSIS — I350 Nonrheumatic aortic (valve) stenosis: Secondary | ICD-10-CM

## 2023-06-03 DIAGNOSIS — I340 Nonrheumatic mitral (valve) insufficiency: Secondary | ICD-10-CM

## 2023-06-03 DIAGNOSIS — I361 Nonrheumatic tricuspid (valve) insufficiency: Secondary | ICD-10-CM

## 2023-06-04 ENCOUNTER — Telehealth: Payer: Self-pay

## 2023-06-04 NOTE — Telephone Encounter (Signed)
Left message on My Chart with normal results per Dr. Krasowski's note. Routed to PCP. 

## 2023-06-07 ENCOUNTER — Encounter: Payer: Self-pay | Admitting: Cardiology

## 2023-06-07 ENCOUNTER — Ambulatory Visit: Payer: Medicare Other | Attending: Cardiology | Admitting: Cardiology

## 2023-06-07 VITALS — BP 165/90 | HR 57 | Ht 65.0 in | Wt 184.2 lb

## 2023-06-07 DIAGNOSIS — I5022 Chronic systolic (congestive) heart failure: Secondary | ICD-10-CM | POA: Diagnosis not present

## 2023-06-07 DIAGNOSIS — D6869 Other thrombophilia: Secondary | ICD-10-CM

## 2023-06-07 DIAGNOSIS — I4819 Other persistent atrial fibrillation: Secondary | ICD-10-CM

## 2023-06-07 NOTE — Progress Notes (Signed)
  Electrophysiology Office Note:   Date:  06/07/2023  ID:  Briana Barrett, DOB [birth year], MRN [id number]  Primary Cardiologist: Gypsy Balsam, MD Electrophysiologist: Regan Lemming, MD      History of Present Illness:   Briana Barrett is a 77 y.o. female with h/o atrial fibrillation, chronic systolic heart failure seen today for routine electrophysiology followup.  Since last being seen in our clinic the patient reports doing somewhat well.  She has noted no further episodes of atrial fibrillation.  Her main complaint today is shortness of breath.  She gets short of breath doing just about any daily activity.  She has trouble exerting herself.  This has been going on for the last few months.  She recently had an echo that showed a normal ejection fraction with collapse of her IVC.  She had recent labs that showed a mildly elevated BNP.Marland Kitchen  she denies chest pain, palpitations, dyspnea, PND, orthopnea, nausea, vomiting, dizziness, syncope, edema, weight gain, or early satiety.   Review of systems complete and found to be negative unless listed in HPI.   Studies Reviewed:    EKG is not ordered today. EKG from 06/01/23 reviewed which showed sinus rhythm    Risk Assessment/Calculations:    CHA2DS2-VASc Score = 7   This indicates a 11.2% annual risk of stroke. The patient's score is based upon: CHF History: 1 HTN History: 1 Diabetes History: 1 Stroke History: 0 Vascular Disease History: 1 (coronary calcification on CT) Age Score: 2 Gender Score: 1            Physical Exam:   VS:  BP (!) 165/90   Pulse (!) 57   Ht 5\' 5"  (1.651 m)   Wt 184 lb 3.2 oz (83.6 kg)   SpO2 98%   BMI 30.65 kg/m    Wt Readings from Last 3 Encounters:  06/07/23 184 lb 3.2 oz (83.6 kg)  06/01/23 184 lb 3.2 oz (83.6 kg)  03/26/23 180 lb (81.6 kg)     GEN: Well nourished, well developed in no acute distress NECK: No JVD; No carotid bruits CARDIAC: Regular rate and rhythm, no murmurs,  rubs, gallops RESPIRATORY:  Clear to auscultation without rales, wheezing or rhonchi  ABDOMEN: Soft, non-tender, non-distended EXTREMITIES:  No edema; No deformity   ASSESSMENT AND PLAN:    1.  Persistent atrial fibrillation: Currently on Eliquis.  Post ablation 08/15/2021.  She has had no further episodes of atrial fibrillation.  No changes.  2.  Chronic systolic heart failure: Currently on Toprol-XL and losartan.  Has had a recent echo with a normal ejection.  She is somewhat short of breath with exertion.  Briana Barrett increase torsemide to 40 mg twice daily for 4 days.  Otherwise follow-up with primary cardiology.  3.  Secondary hypercoagulable state: Currently on Eliquis for atrial fibrillation  Follow up with Dr. Elberta Fortis in 12 months  Signed, Mariyah Upshaw Jorja Loa, MD

## 2023-06-07 NOTE — Patient Instructions (Addendum)
Medication Instructions:  Your physician has recommended you make the following change in your medication:  INCREASE Torsemide to 20 mg TWICE a day for 4 days, then return to normal 20 mg once daily dosing  *If you need a refill on your cardiac medications before your next appointment, please call your pharmacy*   Lab Work: None ordered   Testing/Procedures: None ordered   Follow-Up: At [facility], you and your health needs are our priority.  As part of our continuing mission to provide you with exceptional heart care, we have created designated Provider Care Teams.  These Care Teams include your primary Cardiologist (physician) and Advanced Practice Providers (APPs -  Physician Assistants and Nurse Practitioners) who all work together to provide you with the care you need, when you need it.  Your next appointment:   1 year(s)  The format for your next appointment:   In Person  Provider:   Loman Brooklyn, MD{    Thank you for choosing [facility]!!   Dory Horn, RN [phone]

## 2023-06-09 ENCOUNTER — Telehealth: Payer: Self-pay

## 2023-06-09 NOTE — Telephone Encounter (Signed)
Results reviewed with pt as per Dr. Krasowski's note.  Pt verbalized understanding and had no additional questions. Routed to PCP  

## 2023-06-11 ENCOUNTER — Other Ambulatory Visit: Payer: Medicare Other

## 2023-06-11 ENCOUNTER — Ambulatory Visit: Payer: Medicare Other | Admitting: Oncology

## 2023-07-05 ENCOUNTER — Other Ambulatory Visit: Payer: Self-pay

## 2023-07-05 MED ORDER — TORSEMIDE 20 MG PO TABS: 20.0000 mg | ORAL_TABLET | Freq: Two times a day (BID) | ORAL | 2 refills | Status: DC

## 2023-07-05 MED ORDER — ROSUVASTATIN CALCIUM 10 MG PO TABS: 10.0000 mg | ORAL_TABLET | Freq: Every day | ORAL | 2 refills | Status: DC

## 2023-07-06 ENCOUNTER — Encounter: Payer: Self-pay | Admitting: Oncology

## 2023-07-13 ENCOUNTER — Other Ambulatory Visit: Payer: Medicare Other

## 2023-07-13 ENCOUNTER — Ambulatory Visit: Payer: Medicare Other | Admitting: Oncology

## 2023-08-13 ENCOUNTER — Telehealth: Payer: Self-pay | Admitting: Cardiology

## 2023-08-13 NOTE — Telephone Encounter (Signed)
Talked to Pt. Told patient that Briana Barrett was out of the office today and if I could take a message for her. Pt informed me it was about a bill she received from the [place] office. I suggested she could call the main line or possibly the [place] office to get assistance with her billing question.Pt stated understanding.

## 2023-08-13 NOTE — Telephone Encounter (Signed)
Patient called and would like for Sherri Price to give her a call back

## 2023-08-18 ENCOUNTER — Ambulatory Visit: Payer: Medicare Other | Admitting: Cardiology

## 2023-09-06 ENCOUNTER — Other Ambulatory Visit: Payer: Self-pay | Admitting: Cardiology

## 2023-10-06 DIAGNOSIS — N6320 Unspecified lump in the left breast, unspecified quadrant: Secondary | ICD-10-CM

## 2023-10-06 HISTORY — DX: Unspecified lump in the left breast, unspecified quadrant: N63.20

## 2023-10-15 ENCOUNTER — Other Ambulatory Visit: Payer: Self-pay | Admitting: Cardiology

## 2023-11-05 ENCOUNTER — Ambulatory Visit: Payer: Medicare HMO | Admitting: Cardiology

## 2023-11-11 ENCOUNTER — Ambulatory Visit: Payer: Medicare HMO | Admitting: Cardiology

## 2023-11-15 NOTE — Progress Notes (Signed)
Cardiology Office Note:    Date:  11/15/2023   ID:  Briana Barrett, DOB February 29, 1946, MRN [id number]  PCP:  Briana Payment., MD  Cardiologist:  Briana Balsam, MD    Referring MD: Briana Payment., MD   No chief complaint on file. Short of breath  History of Present Illness:    Briana Barrett is a 77 y.o. female past medical history significant for atrial fibrillation, status post atrial fibrillation ablation, diastolic congestive heart failure, dyslipidemia.  Came to the office for regular follow-up.  She is been complaining of having some shortness of breath.  There is not much swelling of lower extremities denies have any palpitations no chest pain tightness squeezing pressure burning chest  Past Medical History:  Diagnosis Date   Age-related osteoporosis without current pathological fracture 01/21/2016   Last Assessment & Plan:  Relevant Hx: Course: Daily Update: Today's Plan:she feels this is stable for her   Electronically signed by: Krystal Clark, NP 05/11/16 1430   Allergic rhinitis 01/21/2016   Last Assessment & Plan:  Relevant Hx: Course: Daily Update: Today's Plan:this is stable for her  Electronically signed by: Krystal Clark, NP 05/11/16 1427   Anemia, iron deficiency 01/21/2016   Last Assessment & Plan:  Her last iron level was 68 and she is taking the iron daily see her CBC   Anxiety 04/13/2016   Last Assessment & Plan:  She is taking the xanax more daily and not her zoloft and she thinks it helps her more   Atrial fibrillation (HCC)    Bilateral primary osteoarthritis of knee 03/25/2021   Bradycardia 07/02/2017   Calcification of lung 01/21/2016   Coronary artery calcification seen on CT scan 01/21/2016   DDD (degenerative disc disease), lumbosacral 01/21/2016   Last Assessment & Plan:  Relevant Hx: Course: Daily Update: Today's Plan:she is working again at third shift at the [employer] and she is on her feet and that is making this  worse for her  Electronically signed by: Krystal Clark, NP 05/11/16 1429   Diabetes mellitus type 2, controlled (HCC) 01/21/2016   Last Assessment & Plan:  She does not check her sugar as her last average was good for her    Diastolic congestive heart failure, NYHA class 2 (HCC) 11/01/2018   Dyslipidemia 07/23/2016   Encounter for long-term (current) use of high-risk medication 01/21/2016   Episodic lightheadedness 07/02/2017   Gastroesophageal reflux disease without esophagitis 01/21/2016   Hiatal hernia 01/21/2016   Described as large on chest x-ray 2019   History of compression fracture of spine 01/21/2016   History of right breast cancer 01/21/2016   Hypercholesteremia    Hypertension    Hypertension, essential 08/27/2015   Last Assessment & Plan:  Her BP readings that she brings in here are up and down, she has a pending appt with cardiology to FU on this, and she has an extreme amount of stress at home as well that is contributing to this . 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   Impaired functional mobility, balance, gait, and endurance 10/19/2018   Labial cyst 09/09/2021   Localized edema 01/21/2016   Malaise and fatigue 01/21/2016   Last Assessment & Plan:  I  really feel her S/S are tied to her BP and the heart rate and it not ideally being controlled for her with her inability to take multiple meds due to her S/e, she is frustrated with this and she stopped her amiodarone last Thursday, and she did not have any episodes since august 4-5, but is taking coreg as directed   Malignant neoplasm of right breast (HCC) 01/21/2016   Mixed hyperlipidemia 01/21/2016   Last Assessment & Plan:  Relevant Hx: Course: Daily Update: Today's Plan:update this for her fasting  Electronically  signed by: Krystal Clark, NP 05/11/16 1432   Moderate recurrent major depression (HCC) 01/21/2016   Last Assessment & Plan:  Relevant Hx: Course: Daily Update: Today's Plan:this has been stable for her  Electronically signed by: Krystal Clark, NP 05/11/16 1432   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HCC) 09/13/2015   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   Last Assessment & Plan:  Her last TSH was normal   Osteoarthritis, generalized 01/21/2016   Last Assessment & Plan:  Relevant Hx: Course: Daily Update: Today's Plan:this is stable for her at this time  Electronically signed by: Krystal Clark, NP 05/11/16 1430   Ovarian cyst, right 01/21/2016   Paroxysmal atrial flutter (HCC) 07/23/2016   Overview:  Chads score equals 1, prefers aspirin only Overview:  Overview:  Chads score equals 1, prefers aspirin only   Permanent atrial fibrillation (HCC) 10/02/2020   Plantar fasciitis    Prediabetes 01/21/2016   Last Assessment & Plan:  Formatting of this note might be different from the original. She does not check her sugar as her last average was good for her   Primary insomnia 04/13/2016   Last Assessment & Plan:  Relevant Hx: Course: Daily Update: Today's Plan:this has been more difficult with her working her third shift  Electronically signed by: Krystal Clark, NP 05/11/16 1433   Renal cyst, right 01/21/2016   Secondary hypercoagulable state (HCC) 08/19/2021   Severe episode of recurrent major depressive disorder, without psychotic features (HCC) 04/12/2020   Swelling 07/02/2017   Thoracic degenerative disc disease 06/16/2018   Tremor 10/21/2018    Past Surgical History:  Procedure Laterality Date   ANKLE SURGERY     ATRIAL FIBRILLATION ABLATION N/A 08/15/2021   Procedure: ATRIAL FIBRILLATION ABLATION;  Surgeon: Regan Lemming, MD;  Location: [facility] INVASIVE [facility];  Service: Cardiovascular;  Laterality: N/A;   BREAST SURGERY     CARDIOVERSION N/A 03/12/2021   Procedure:  CARDIOVERSION;  Surgeon: Nahser, Deloris Ping, MD;  Location: [facility] ENDOSCOPY;  Service: Cardiovascular;  Laterality: N/A;   CATARACT EXTRACTION     CHOLECYSTECTOMY     FOOT SURGERY     right   SHOULDER SURGERY     SKIN SURGERY  07/2020   nose   TUBAL LIGATION      Current Medications: No outpatient medications have been marked as taking for the 05/31/23 encounter (Office Visit) with Georgeanna Lea, MD.     Allergies:   Amlodipine besylate, Buprenorphine hcl, and Morphine and codeine   Social History   Socioeconomic History   Marital status: Widowed    Spouse name: Not on file   Number of children: Not on file   Years of education: Not on file   Highest education level: Not on file  Occupational History   Not on file  Tobacco Use   Smoking status: Never   Smokeless tobacco: Never  Vaping Use   Vaping status: Never Used  Substance and Sexual  Activity   Alcohol use: No   Drug use: No   Sexual activity: Not on file  Other Topics Concern   Not on file  Social History Narrative   ** Merged History Encounter **       Social Determinants of Health   Financial Resource Strain: High Risk (07/28/2021)   Overall Financial Resource Strain (CARDIA)    Difficulty of Paying Living Expenses: Hard  Food Insecurity: Low Risk  (09/24/2023)   Received from [facility]   Hunger Vital Sign    Worried About Running Out of Food in the Last Year: Never true    Ran Out of Food in the Last Year: Never true  Transportation Needs: No Transportation Needs (09/24/2023)   Received from [facility]    In the past 12 months, has lack of reliable transportation kept you from medical appointments, meetings, work or from getting things needed for daily living? : No  Physical Activity: Not on file  Stress: Stress Concern Present (07/28/2021)   [employer] of Occupational Health - Occupational Stress Questionnaire    Feeling of Stress : Rather much  Social Connections: Not on  file     Family History: The patient's family history includes COPD in her father; Cancer in her mother; Heart failure in her sister; Hypertension in her father and mother. ROS:   Please see the history of present illness.    All 14 point review of systems negative except as described per history of present illness  EKGs/Labs/Other Studies Reviewed:         Recent Labs: 03/12/2023: Hemoglobin 12.2; Platelets 253 05/31/2023: ALT 10; BUN 13; Creatinine, Ser 0.98; NT-Pro BNP 885; Potassium 4.2; Sodium 141  Recent Lipid Panel    Component Value Date/Time   CHOL 162 02/12/2022 1052   TRIG 103 02/12/2022 1052   HDL 49 02/12/2022 1052   CHOLHDL 3.3 02/12/2022 1052   LDLCALC 94 02/12/2022 1052    Physical Exam:    VS:  BP 130/82 (BP Location: Right Arm, Patient Position: Sitting, Cuff Size: Normal)   Pulse 64   Wt 184 lb 3.2 oz (83.6 kg)   BMI 30.65 kg/m     Wt Readings from Last 3 Encounters:  06/07/23 184 lb 3.2 oz (83.6 kg)  06/01/23 184 lb 3.2 oz (83.6 kg)  03/26/23 180 lb (81.6 kg)     GEN:  Well nourished, well developed in no acute distress HEENT: Normal NECK: No JVD; No carotid bruits LYMPHATICS: No lymphadenopathy CARDIAC: RRR, no murmurs, no rubs, no gallops RESPIRATORY:  Clear to auscultation without rales, wheezing or rhonchi  ABDOMEN: Soft, non-tender, non-distended MUSCULOSKELETAL:  No edema; No deformity  SKIN: Warm and dry LOWER EXTREMITIES: no swelling NEUROLOGIC:  Alert and oriented x 3 PSYCHIATRIC:  Normal affect   ASSESSMENT:    1. Dyspnea, unspecified type   2. Paroxysmal atrial fibrillation (HCC)   3. Coronary artery calcification seen on CT scan   4. Chronic diastolic congestive heart failure, NYHA class 2 (HCC)   5. Hypertension, essential    PLAN:    In order of problems listed above:  Dyspnea on exertion.  Will schedule her to have echocardiogram, proBNP, Chem-7. Paroxysmal atrial fibrillation status for his ablation maintain sinus  rhythm. Chronic diastolic congestive heart failure plan as described above proBNP Chem-7 echocardiogram will be done. Essential hypertension we will continue monitoring   Medication Adjustments/Labs and Tests Ordered: Current medicines are reviewed at length with the patient today.  Concerns regarding medicines are outlined above.  Orders Placed This Encounter  Procedures   Pro b natriuretic peptide (BNP)   Comp Met (CMET)   ECHOCARDIOGRAM COMPLETE   Medication changes: No orders of the defined types were placed in this encounter.   Signed, Georgeanna Lea, MD, [facility] 11/15/2023 9:33 AM    [facility]

## 2023-11-22 NOTE — Progress Notes (Deleted)
 Cardiology Office Note:  .   Date:  11/22/2023  ID:  Briana Barrett, DOB 10/08/1946, MRN [id number] PCP: Gordan Payment., MD  [facility] Providers Cardiologist:  Gypsy Balsam, MD Electrophysiologist:  Will Jorja Loa, MD { Click to update primary MD,subspecialty MD or APP then REFRESH:1}   History of Present Illness: Marland Kitchen   Briana Barrett is a 77 y.o. female with a past medical history of CAD, hypertension, systolic heart failure, paroxysmal atrial flutter, paroxysmal atrial fibrillation on Eliquis, GERD, DM 2, dyslipidemia, bradycardia, history of breast cancer.  06/03/2023 echo EF 55 to 60%, mild concentric LVH, mild MR, mild aortic stenosis, mild MR 12/22/2022 Lexiscan normal, low risk study 08/15/2021 s/p ablation for atrial fibrillation 08/11/2021 coronary CTA calcium score of 562, 85th percentile  Evaluated by Dr. Bing Matter on 05/31/2023, concern for shortness of breath, echocardiogram was arranged and completed on 06/03/2023 which revealed mild concentric LVH, EF 55 to 60%, mild MR, mild aortic stenosis, mild TR.  Evaluated by Dr. Elberta Fortis on 06/07/2023, she was maintaining sinus rhythm, no further episodes of atrial fibrillation, largest complaint was shortness of breath that have been occurring for a few months, her torsemide was increased to twice daily for 4 days, return to EP in 1 year.  ROS: ROS   Studies Reviewed: .        Cardiac Studies & Procedures     STRESS TESTS  MYOCARDIAL PERFUSION IMAGING 12/22/2022  Narrative   The study is normal. The study is low risk.   Left ventricular function is normal. Nuclear stress EF: 64 %. The left ventricular ejection fraction is normal (55-65%). End diastolic cavity size is normal.   ECHOCARDIOGRAM  ECHOCARDIOGRAM COMPLETE 08/19/2021  Narrative ECHOCARDIOGRAM REPORT    Patient Name:   Briana Barrett Date of Exam: 08/19/2021 Medical Rec #:  [id number]             Height:       66.0 in Accession #:     [id number]            Weight:       181.0 lb Date of Birth:  1946/07/18             BSA:          1.917 m Patient Age:    75 years              BP:           146/90 mmHg Patient Gender: F                     HR:           65 bpm. Exam Location:  Outpatient  Procedure: 2D Echo, Cardiac Doppler and Color Doppler  Indications:    Atrial fibrillation  History:        Patient has prior history of Echocardiogram examinations, most recent 08/19/2020. CHF, Arrythmias:Atrial Fibrillation, Signs/Symptoms:Shortness of Breath and s/p ablation 08/15/21; Risk Factors:Hypertension, Dyslipidemia and Diabetes.  Sonographer:    Lavenia Atlas RDCS Referring Phys: [id number] CL[place] R [place]  IMPRESSIONS   1. Hypokinesis of the distal lateral and apical walls. . Left ventricular ejection fraction, by estimation, is 50 to 55%. The left ventricle has low normal function. Left ventricular diastolic parameters are indeterminate. 2. Right ventricular systolic function is normal. The right ventricular size is normal. 3. Right atrial size was mildly dilated. 4. Mild mitral valve regurgitation. 5. AV is thickened, calcified with mildly  restricted motion. Peak and mean gradients through the valve are 10 and 5 mm Hg respectively consistent with mild AS. Marland Kitchen Aortic valve regurgitation is not visualized. 6. The inferior vena cava is normal in size with <50% respiratory variability, suggesting right atrial pressure of 8 mmHg.  FINDINGS Left Ventricle: Hypokinesis of the distal lateral and apical walls. Left ventricular ejection fraction, by estimation, is 50 to 55%. The left ventricle has low normal function. The left ventricular internal cavity size was normal in size. There is no left ventricular hypertrophy. Left ventricular diastolic parameters are indeterminate.  Right Ventricle: The right ventricular size is normal. Right vetricular wall thickness was not assessed. Right ventricular systolic function is  normal.  Left [facility]: Left atrial size was normal in size.  Right [facility]: Right atrial size was mildly dilated.  Pericardium: There is no evidence of pericardial effusion.  Mitral Valve: There is mild thickening of the mitral valve leaflet(s). Mild mitral annular calcification. Mild mitral valve regurgitation.  Tricuspid Valve: The tricuspid valve is normal in structure. Tricuspid valve regurgitation is mild.  Aortic Valve: AV is thickened, calcified with mildly restricted motion. Peak and mean gradients through the valve are 10 and 5 mm Hg respectively consistent with mild AS. Aortic valve regurgitation is not visualized. Aortic valve mean gradient measures 5.0 mmHg. Aortic valve peak gradient measures 10.0 mmHg. Aortic valve area, by VTI measures 1.51 cm.  Pulmonic Valve: The pulmonic valve was grossly normal. Pulmonic valve regurgitation is mild.  Aorta: The aortic root and ascending aorta are structurally normal, with no evidence of dilitation.  Venous: The inferior vena cava is normal in size with less than 50% respiratory variability, suggesting right atrial pressure of 8 mmHg.  IAS/Shunts: No atrial level shunt detected by color flow Doppler.   LEFT VENTRICLE PLAX 2D LVIDd:         4.70 cm  Diastology LVIDs:         3.20 cm  LV e' medial:    4.46 cm/s LV PW:         1.10 cm  LV E/e' medial:  22.9 LV IVS:        1.20 cm  LV e' lateral:   4.79 cm/s LVOT diam:     2.00 cm  LV E/e' lateral: 21.3 LV SV:         47 LV SV Index:   25 LVOT Area:     3.14 cm   RIGHT VENTRICLE RV Basal diam:  3.10 cm RV S prime:     13.10 cm/s TAPSE (M-mode): 1.8 cm  LEFT ATRIUM             Index       RIGHT ATRIUM           Index LA diam:        4.00 cm 2.09 cm/m  RA Area:     24.20 cm LA Vol (A2C):   49.4 ml 25.77 ml/m RA Volume:   81.50 ml  42.52 ml/m LA Vol (A4C):   60.0 ml 31.30 ml/m LA Biplane Vol: 57.6 ml 30.05 ml/m AORTIC VALVE AV Area (Vmax):    1.41 cm AV Area (Vmean):    1.44 cm AV Area (VTI):     1.51 cm AV Vmax:           158.00 cm/s AV Vmean:          106.500 cm/s AV VTI:  0.312 m AV Peak Grad:      10.0 mmHg AV Mean Grad:      5.0 mmHg LVOT Vmax:         70.80 cm/s LVOT Vmean:        48.700 cm/s LVOT VTI:          0.150 m LVOT/AV VTI ratio: 0.48  AORTA Ao Root diam: 2.90 cm  MITRAL VALVE                TRICUSPID VALVE MV Area (PHT): 2.69 cm     TR Peak grad:   35.3 mmHg MV Decel Time: 282 msec     TR Vmax:        297.00 cm/s MV E velocity: 102.00 cm/s MV A velocity: 29.00 cm/s   SHUNTS MV E/A ratio:  3.52         Systemic VTI:  0.15 m Systemic Diam: 2.00 cm  Dietrich Pates MD Electronically signed by Dietrich Pates MD Signature Date/Time: 08/19/2021/5:31:18 PM    Final    MONITORS  LONG TERM MONITOR (3-14 DAYS) 10/08/2020  Narrative A ZIO monitor was performed for 3 days beginning 09/18/2020 to assess atrial fibrillation.  The cardiac rhythm throughout was atrial fibrillation with average, minimum maximum heart rates of 77, 45 and 169 bpm. Heart rate control was good, daytime 97% between 50 and 110 bpm 1% between 110 150 bpm and less than 1% less than 50 or greater than 150 bpm.  Nighttime 100% heart rate control 50 to 110 bpm.  There were no pauses of 3 seconds or greater.  Ventricular ectopy was rare with isolated PVCs and couplets.  There were 4 triggered and 3 diary events associated with isolated PVCs   Conclusion, persistent atrial fibrillation with good heart rate control.  Although ventricular ectopy was rare the triggered and diary events are associated with isolated PVCs.           Risk Assessment/Calculations:    CHA2DS2-VASc Score = 7  {Confirm score is correct.  If not, click here to update score.  REFRESH note.  :1} This indicates a 11.2% annual risk of stroke. The patient's score is based upon: CHF History: 1 HTN History: 1 Diabetes History: 1 Stroke History: 0 Vascular Disease History: 1 (coronary  calcification on CT) Age Score: 2 Gender Score: 1   {This patient has a significant risk of stroke if diagnosed with atrial fibrillation.  Please consider VKA or DOAC agent for anticoagulation if the bleeding risk is acceptable.   You can also use the SmartPhrase .[email or web address] for documentation.   :[id number]} No BP recorded.  {Refresh Note OR Click here to enter BP  :1}***       Physical Exam:   VS:  There were no vitals taken for this visit.   Wt Readings from Last 3 Encounters:  06/07/23 184 lb 3.2 oz (83.6 kg)  06/01/23 184 lb 3.2 oz (83.6 kg)  03/26/23 180 lb (81.6 kg)    GEN: Well nourished, well developed in no acute distress NECK: No JVD; No carotid bruits CARDIAC: ***RRR, no murmurs, rubs, gallops RESPIRATORY:  Clear to auscultation without rales, wheezing or rhonchi  ABDOMEN: Soft, non-tender, non-distended EXTREMITIES:  No edema; No deformity   ASSESSMENT AND PLAN: .   ***    {Are you ordering a CV Procedure (e.g. stress test, cath, DCCV, TEE, etc)?   Press F2        :[id number]}  Dispo: ***  Signed, Flossie Dibble,  NP

## 2023-11-23 ENCOUNTER — Ambulatory Visit: Payer: Medicare HMO | Admitting: Cardiology

## 2023-11-23 DIAGNOSIS — E785 Hyperlipidemia, unspecified: Secondary | ICD-10-CM

## 2023-11-23 DIAGNOSIS — I2510 Atherosclerotic heart disease of native coronary artery without angina pectoris: Secondary | ICD-10-CM

## 2023-11-23 DIAGNOSIS — I10 Essential (primary) hypertension: Secondary | ICD-10-CM

## 2023-11-23 DIAGNOSIS — I480 Paroxysmal atrial fibrillation: Secondary | ICD-10-CM

## 2023-11-23 DIAGNOSIS — I5030 Unspecified diastolic (congestive) heart failure: Secondary | ICD-10-CM

## 2023-11-23 DIAGNOSIS — R001 Bradycardia, unspecified: Secondary | ICD-10-CM

## 2023-11-29 NOTE — Progress Notes (Addendum)
 " Cardiology Office Note:  .   Date:  11/30/2023  ID:  Briana Barrett, DOB 04-Mar-1946, MRN [id number] PCP: Thurmond Cathlyn LABOR., MD  [facility] Providers Cardiologist:  Redell Leiter, MD Electrophysiologist:  Will Gladis Norton, MD    History of Present Illness: Briana   Recie Barrett is a 77 y.o. female with a past medical history of CAD, hypertension, systolic heart failure, paroxysmal atrial flutter, paroxysmal atrial fibrillation on Eliquis , GERD, DM 2, dyslipidemia, bradycardia, history of breast cancer.  06/03/2023 echo EF 55 to 60%, mild concentric LVH, mild MR, mild aortic stenosis, mild MR 12/22/2022 Lexiscan  normal, low risk study 08/15/2021 s/p ablation for atrial fibrillation 08/11/2021 coronary CTA calcium  score of 562, 85th percentile  Evaluated by Dr. Bernie on 05/31/2023, concern for shortness of breath, echocardiogram was arranged and completed on 06/03/2023 which revealed mild concentric LVH, EF 55 to 60%, mild MR, mild aortic stenosis, mild TR.  Evaluated by Dr. Norton on 06/07/2023, she was maintaining sinus rhythm, no further episodes of atrial fibrillation, largest complaint was shortness of breath that have been occurring for a few months, her torsemide  was increased to twice daily for 4 days, return to EP in 1 year.  She presents today for follow-up of her atrial fibrillation and CAD.  She is covering from COVID, has not felt well for approximately a month and is slowly beginning to feel better.  Her blood pressure is elevated in the office today however is typically better controlled at home, states she cannot tolerate if her blood pressure is much less than 140 systolic.  She mentions that she has had episode of palpitations, she thinks it might be associated with anxiety as it typically occurs when she is in a large group setting.  She does have a Kardia mobile, checks routinely, we review some of her tracings and it does appear that at times she is having  episodes of atrial fibrillation however there is also a lot of artifact in her tracings.  We discussed possibly wearing a monitor to determine definitively if she is having episodes of atrial fibrillation however she does not think this is warranted at this time it has been very rare, and she is not bothered by it-as she is also not sure if she would want to try any antiarrhythmics or a dyspnea, pnd, orthopnea, n, v, dizziness, syncope, edema, weight gain, or early satiety.     ROS: Review of Systems  Cardiovascular:  Positive for palpitations.  Musculoskeletal:  Positive for joint pain.  All other systems reviewed and are negative.    Studies Reviewed: .        Cardiac Studies & Procedures     STRESS TESTS  MYOCARDIAL PERFUSION IMAGING 12/22/2022  Narrative   The study is normal. The study is low risk.   Left ventricular function is normal. Nuclear stress EF: 64 %. The left ventricular ejection fraction is normal (55-65%). End diastolic cavity size is normal.   ECHOCARDIOGRAM  ECHOCARDIOGRAM COMPLETE 08/19/2021  Narrative ECHOCARDIOGRAM REPORT    Patient Name:   Briana Barrett Date of Exam: 08/19/2021 Medical Rec #:  [id number]             Height:       66.0 in Accession #:    [id number]            Weight:       181.0 lb Date of Birth:  1946/09/17  BSA:          1.917 m Patient Age:    75 years              BP:           146/90 mmHg Patient Gender: F                     HR:           65 bpm. Exam Location:  Outpatient  Procedure: 2D Echo, Cardiac Doppler and Color Doppler  Indications:    Atrial fibrillation  History:        Patient has prior history of Echocardiogram examinations, most recent 08/19/2020. CHF, Arrythmias:Atrial Fibrillation, Signs/Symptoms:Shortness of Breath and s/p ablation 08/15/21; Risk Factors:Hypertension, Dyslipidemia and Diabetes.  Sonographer:    Lyle Marc RDCS Referring Phys: [id number] CLINT R [place]  IMPRESSIONS   1.  Hypokinesis of the distal lateral and apical walls. . Left ventricular ejection fraction, by estimation, is 50 to 55%. The left ventricle has low normal function. Left ventricular diastolic parameters are indeterminate. 2. Right ventricular systolic function is normal. The right ventricular size is normal. 3. Right atrial size was mildly dilated. 4. Mild mitral valve regurgitation. 5. AV is thickened, calcified with mildly restricted motion. Peak and mean gradients through the valve are 10 and 5 mm Hg respectively consistent with mild AS. Briana Aortic valve regurgitation is not visualized. 6. The inferior vena cava is normal in size with <50% respiratory variability, suggesting right atrial pressure of 8 mmHg.  FINDINGS Left Ventricle: Hypokinesis of the distal lateral and apical walls. Left ventricular ejection fraction, by estimation, is 50 to 55%. The left ventricle has low normal function. The left ventricular internal cavity size was normal in size. There is no left ventricular hypertrophy. Left ventricular diastolic parameters are indeterminate.  Right Ventricle: The right ventricular size is normal. Right vetricular wall thickness was not assessed. Right ventricular systolic function is normal.  Left Atrium: Left atrial size was normal in size.  Right Atrium: Right atrial size was mildly dilated.  Pericardium: There is no evidence of pericardial effusion.  Mitral Valve: There is mild thickening of the mitral valve leaflet(s). Mild mitral annular calcification. Mild mitral valve regurgitation.  Tricuspid Valve: The tricuspid valve is normal in structure. Tricuspid valve regurgitation is mild.  Aortic Valve: AV is thickened, calcified with mildly restricted motion. Peak and mean gradients through the valve are 10 and 5 mm Hg respectively consistent with mild AS. Aortic valve regurgitation is not visualized. Aortic valve mean gradient measures 5.0 mmHg. Aortic valve peak gradient measures  10.0 mmHg. Aortic valve area, by VTI measures 1.51 cm.  Pulmonic Valve: The pulmonic valve was grossly normal. Pulmonic valve regurgitation is mild.  Aorta: The aortic root and ascending aorta are structurally normal, with no evidence of dilitation.  Venous: The inferior vena cava is normal in size with less than 50% respiratory variability, suggesting right atrial pressure of 8 mmHg.  IAS/Shunts: No atrial level shunt detected by color flow Doppler.   LEFT VENTRICLE PLAX 2D LVIDd:         4.70 cm  Diastology LVIDs:         3.20 cm  LV e' medial:    4.46 cm/s LV PW:         1.10 cm  LV E/e' medial:  22.9 LV IVS:        1.20 cm  LV e' lateral:   4.79 cm/s  LVOT diam:     2.00 cm  LV E/e' lateral: 21.3 LV SV:         47 LV SV Index:   25 LVOT Area:     3.14 cm   RIGHT VENTRICLE RV Basal diam:  3.10 cm RV S prime:     13.10 cm/s TAPSE (M-mode): 1.8 cm  LEFT ATRIUM             Index       RIGHT ATRIUM           Index LA diam:        4.00 cm 2.09 cm/m  RA Area:     24.20 cm LA Vol (A2C):   49.4 ml 25.77 ml/m RA Volume:   81.50 ml  42.52 ml/m LA Vol (A4C):   60.0 ml 31.30 ml/m LA Biplane Vol: 57.6 ml 30.05 ml/m AORTIC VALVE AV Area (Vmax):    1.41 cm AV Area (Vmean):   1.44 cm AV Area (VTI):     1.51 cm AV Vmax:           158.00 cm/s AV Vmean:          106.500 cm/s AV VTI:            0.312 m AV Peak Grad:      10.0 mmHg AV Mean Grad:      5.0 mmHg LVOT Vmax:         70.80 cm/s LVOT Vmean:        48.700 cm/s LVOT VTI:          0.150 m LVOT/AV VTI ratio: 0.48  AORTA Ao Root diam: 2.90 cm  MITRAL VALVE                TRICUSPID VALVE MV Area (PHT): 2.69 cm     TR Peak grad:   35.3 mmHg MV Decel Time: 282 msec     TR Vmax:        297.00 cm/s MV E velocity: 102.00 cm/s MV A velocity: 29.00 cm/s   SHUNTS MV E/A ratio:  3.52         Systemic VTI:  0.15 m Systemic Diam: 2.00 cm  Briana Gull MD Electronically signed by Briana Gull MD Signature Date/Time:  08/19/2021/5:31:18 PM    Final    MONITORS  LONG TERM MONITOR (3-14 DAYS) 10/08/2020  Narrative A ZIO monitor was performed for 3 days beginning 09/18/2020 to assess atrial fibrillation.  The cardiac rhythm throughout was atrial fibrillation with average, minimum maximum heart rates of 77, 45 and 169 bpm. Heart rate control was good, daytime 97% between 50 and 110 bpm 1% between 110 150 bpm and less than 1% less than 50 or greater than 150 bpm.  Nighttime 100% heart rate control 50 to 110 bpm.  There were no pauses of 3 seconds or greater.  Ventricular ectopy was rare with isolated PVCs and couplets.  There were 4 triggered and 3 diary events associated with isolated PVCs   Conclusion, persistent atrial fibrillation with good heart rate control.  Although ventricular ectopy was rare the triggered and diary events are associated with isolated PVCs.           Risk Assessment/Calculations:    CHA2DS2-VASc Score = 7   This indicates a 11.2% annual risk of stroke. The patient's score is based upon: CHF History: 1 HTN History: 1 Diabetes History: 1 Stroke History: 0 Vascular Disease History: 1 (coronary calcification on CT) Age Score:  2 Gender Score: 1            Physical Exam:   VS:  BP 138/88   Pulse 66   Ht 5' 5 (1.651 m)   Wt 175 lb (79.4 kg)   SpO2 96%   BMI 29.12 kg/m    Wt Readings from Last 3 Encounters:  11/30/23 175 lb (79.4 kg)  06/07/23 184 lb 3.2 oz (83.6 kg)  06/01/23 184 lb 3.2 oz (83.6 kg)    GEN: Well nourished, well developed in no acute distress NECK: No JVD; No carotid bruits CARDIAC: RRR, no murmurs, rubs, gallops RESPIRATORY:  Clear to auscultation without rales, wheezing or rhonchi  ABDOMEN: Soft, non-tender, non-distended EXTREMITIES: Trace pedal edema; No deformity   ASSESSMENT AND PLAN: .   PAF/hypercoagulable state-s/p ablation in 2022, CHA2DS2-VASc score of 7, she is maintaining sinus rhythm today.  She has had a few episodes of  palpitations as outlined above in HPI--typically occur when she has a large setting and she feels there is a component of anxiety related to this.  We did review her Kardia mobile EKG tracings, it does appear that she is having episodes of atrial fibrillation however there is also a lot of artifact in these tracings.  We discussed wearing a monitor however she politely declines, does not feel it happens frequently enough that it would be caught on a monitor and she does not feel she would want to have a repeat ablation or try an antiarrhythmic at this time.  Continue Eliquis  5 mg twice daily--no indication for dose reduction--lab work recently collected by PCP including CBC and BMET and we will request this be sent to our office for review denies hematuria, hematochezia, hemoptysis.  Continue metoprolol  25 mg daily, continue Cardizem  30 mg as needed-as she has not needed to take this.    CAD-this was apparently noted on a CT of her chest, Stable with no anginal symptoms. No indication for ischemic evaluation.  Currently on Eliquis , metoprolol , Crestor  discharge we will continue all of these. Heart healthy diet and regular cardiovascular exercise encouraged.    Hypertension-blood pressure today was initially elevated however upon recheck it was 138/88, states she cannot typically handle her blood pressure less than 140 systolic secondary to side effects.  Continue Cozaar  100 mg daily, continue metoprolol  25 mg daily.  Dyslipidemia-most recent LDL on 02/12/2022 was elevated at 94, this is formally monitored by her PCP, prefer this to be closer to 70, continue Crestor  10 mg every other day--she is unable to tolerate daily secondary to myalgias.       Dispo: Will request labs from PCP, follow-up in 6 months.   Signed, Delon JAYSON Hoover, NP  "

## 2023-11-30 ENCOUNTER — Encounter: Payer: Self-pay | Admitting: Cardiology

## 2023-11-30 ENCOUNTER — Ambulatory Visit: Payer: Medicare HMO | Attending: Cardiology | Admitting: Cardiology

## 2023-11-30 VITALS — BP 138/88 | HR 66 | Ht 65.0 in | Wt 175.0 lb

## 2023-11-30 DIAGNOSIS — D6859 Other primary thrombophilia: Secondary | ICD-10-CM

## 2023-11-30 DIAGNOSIS — I480 Paroxysmal atrial fibrillation: Secondary | ICD-10-CM

## 2023-11-30 DIAGNOSIS — I2510 Atherosclerotic heart disease of native coronary artery without angina pectoris: Secondary | ICD-10-CM | POA: Diagnosis not present

## 2023-11-30 DIAGNOSIS — I10 Essential (primary) hypertension: Secondary | ICD-10-CM

## 2023-11-30 DIAGNOSIS — E785 Hyperlipidemia, unspecified: Secondary | ICD-10-CM

## 2023-11-30 MED ORDER — APIXABAN 5 MG PO TABS: 5.0000 mg | ORAL_TABLET | Freq: Two times a day (BID) | ORAL | 3 refills | Status: AC

## 2023-11-30 NOTE — Patient Instructions (Signed)
Medication Instructions:  Your physician recommends that you continue on your current medications as directed. Please refer to the Current Medication list given to you today.  *If you need a refill on your cardiac medications before your next appointment, please call your pharmacy*   Lab Work: None Ordered If you have labs (blood work) drawn today and your tests are completely normal, you will receive your results only by: MyChart Message (if you have MyChart) OR A paper copy in the mail If you have any lab test that is abnormal or we need to change your treatment, we will call you to review the results.   Testing/Procedures: None Ordered   Follow-Up: At [facility], you and your health needs are our priority.  As part of our continuing mission to provide you with exceptional heart care, we have created designated Provider Care Teams.  These Care Teams include your primary Cardiologist (physician) and Advanced Practice Providers (APPs -  Physician Assistants and Nurse Practitioners) who all work together to provide you with the care you need, when you need it.  We recommend signing up for the patient portal called "MyChart".  Sign up information is provided on this After Visit Summary.  MyChart is used to connect with patients for Virtual Visits (Telemedicine).  Patients are able to view lab/test results, encounter notes, upcoming appointments, etc.  Non-urgent messages can be sent to your provider as well.   To learn more about what you can do with MyChart, go to [email or web address].    Your next appointment:   6 month follow up with Dr. Dulce Sellar or Wallis Bamberg, NP

## 2024-01-30 ENCOUNTER — Other Ambulatory Visit: Payer: Self-pay | Admitting: Cardiology

## 2024-02-01 NOTE — Telephone Encounter (Signed)
Need to verify how she takes the requested meds. Last visit it was changes to every day but, the request is for bid instruction. I called and LM to return my cal.

## 2024-02-02 MED ORDER — TORSEMIDE 20 MG PO TABS: 20.0000 mg | ORAL_TABLET | Freq: Every day | ORAL | 2 refills | Status: DC

## 2024-02-02 NOTE — Addendum Note (Signed)
Addended by: Heywood Bene on: 02/02/2024 03:02 PM   Modules accepted: Orders

## 2024-02-21 ENCOUNTER — Other Ambulatory Visit: Payer: Self-pay | Admitting: Cardiology

## 2024-06-05 ENCOUNTER — Ambulatory Visit: Admitting: Cardiology

## 2024-07-10 ENCOUNTER — Encounter: Payer: Self-pay | Admitting: Oncology

## 2024-07-25 ENCOUNTER — Other Ambulatory Visit: Payer: Self-pay | Admitting: Cardiology

## 2024-08-09 ENCOUNTER — Other Ambulatory Visit: Payer: Self-pay | Admitting: Cardiology

## 2024-08-09 DIAGNOSIS — I2510 Atherosclerotic heart disease of native coronary artery without angina pectoris: Secondary | ICD-10-CM

## 2024-08-10 ENCOUNTER — Other Ambulatory Visit: Payer: Self-pay | Admitting: Cardiology

## 2024-08-29 ENCOUNTER — Ambulatory Visit: Admitting: Diagnostic Neuroimaging

## 2024-10-09 ENCOUNTER — Other Ambulatory Visit: Payer: Self-pay | Admitting: Cardiology

## 2024-10-23 ENCOUNTER — Other Ambulatory Visit: Payer: Self-pay | Admitting: Cardiology

## 2024-10-31 ENCOUNTER — Other Ambulatory Visit: Payer: Self-pay

## 2024-10-31 MED ORDER — POTASSIUM CHLORIDE CRYS ER 20 MEQ PO TBCR: 20.0000 meq | EXTENDED_RELEASE_TABLET | Freq: Two times a day (BID) | ORAL | 0 refills | Status: DC

## 2024-11-01 ENCOUNTER — Other Ambulatory Visit: Payer: Self-pay | Admitting: Cardiology

## 2024-11-03 MED ORDER — POTASSIUM CHLORIDE CRYS ER 20 MEQ PO TBCR: 20.0000 meq | EXTENDED_RELEASE_TABLET | Freq: Two times a day (BID) | ORAL | 0 refills | Status: AC

## 2024-11-30 ENCOUNTER — Other Ambulatory Visit: Payer: Self-pay | Admitting: *Deleted

## 2024-11-30 ENCOUNTER — Encounter: Payer: Self-pay | Admitting: *Deleted

## 2024-11-30 DIAGNOSIS — I480 Paroxysmal atrial fibrillation: Secondary | ICD-10-CM

## 2025-01-07 ENCOUNTER — Other Ambulatory Visit: Payer: Self-pay | Admitting: Cardiology

## 2025-01-22 ENCOUNTER — Other Ambulatory Visit: Payer: Self-pay | Admitting: Cardiology
